# Patient Record
Sex: Male | Born: 1935 | Race: White | Hispanic: No | Marital: Married | Smoking: Never smoker
Health system: Southern US, Community
[De-identification: ages and names within clinical notes are randomized; demographics above are authoritative.]

## PROBLEM LIST (undated history)

## (undated) DIAGNOSIS — M1990 Unspecified osteoarthritis, unspecified site: Secondary | ICD-10-CM

## (undated) DIAGNOSIS — J449 Chronic obstructive pulmonary disease, unspecified: Secondary | ICD-10-CM

## (undated) DIAGNOSIS — N289 Disorder of kidney and ureter, unspecified: Secondary | ICD-10-CM

## (undated) DIAGNOSIS — N189 Chronic kidney disease, unspecified: Secondary | ICD-10-CM

## (undated) DIAGNOSIS — M109 Gout, unspecified: Secondary | ICD-10-CM

## (undated) DIAGNOSIS — I509 Heart failure, unspecified: Secondary | ICD-10-CM

## (undated) DIAGNOSIS — I10 Essential (primary) hypertension: Secondary | ICD-10-CM

## (undated) HISTORY — PX: CATARACT EXTRACTION: SUR2

## (undated) HISTORY — PX: INGUINAL HERNIA REPAIR: SUR1180

## (undated) HISTORY — PX: POLYPECTOMY: SHX149

## (undated) HISTORY — PX: LAMINECTOMY: SHX219

## (undated) HISTORY — PX: BACK SURGERY: SHX140

## (undated) HISTORY — PX: HERNIA REPAIR: SHX51

## (undated) HISTORY — PX: CHOLECYSTECTOMY: SHX55

## (undated) HISTORY — DX: Essential (primary) hypertension: I10

## (undated) HISTORY — DX: Chronic kidney disease, unspecified: N18.9

## (undated) HISTORY — DX: Unspecified osteoarthritis, unspecified site: M19.90

## (undated) SURGERY — BRONCHOSCOPY, FLEXIBLE
Anesthesia: Moderate Sedation | Laterality: Bilateral

---

## 2004-01-15 HISTORY — PX: UPPER GI ENDOSCOPY: SHX6162

## 2004-07-03 ENCOUNTER — Ambulatory Visit: Payer: Self-pay | Admitting: Internal Medicine

## 2004-07-03 LAB — HM COLONOSCOPY: HM Colonoscopy: NORMAL

## 2008-09-04 ENCOUNTER — Ambulatory Visit: Payer: Self-pay | Admitting: Ophthalmology

## 2008-09-18 ENCOUNTER — Ambulatory Visit: Payer: Self-pay | Admitting: Ophthalmology

## 2008-10-22 ENCOUNTER — Ambulatory Visit: Payer: Self-pay | Admitting: Family Medicine

## 2011-01-27 ENCOUNTER — Ambulatory Visit: Payer: Self-pay | Admitting: Family Medicine

## 2011-03-12 ENCOUNTER — Ambulatory Visit: Payer: Self-pay | Admitting: Family Medicine

## 2011-04-13 ENCOUNTER — Ambulatory Visit: Payer: Self-pay | Admitting: Internal Medicine

## 2011-05-02 ENCOUNTER — Emergency Department: Payer: Self-pay | Admitting: Emergency Medicine

## 2011-05-08 ENCOUNTER — Observation Stay: Payer: Self-pay | Admitting: Internal Medicine

## 2011-10-15 ENCOUNTER — Emergency Department: Payer: Self-pay | Admitting: Emergency Medicine

## 2011-10-15 LAB — URIC ACID: Uric Acid: 5.3 mg/dL (ref 3.5–7.2)

## 2012-11-17 ENCOUNTER — Other Ambulatory Visit: Payer: Self-pay | Admitting: Rheumatology

## 2012-11-17 LAB — SYNOVIAL FLUID, CRYSTAL

## 2013-05-08 ENCOUNTER — Ambulatory Visit: Payer: Self-pay | Admitting: Family Medicine

## 2013-05-15 ENCOUNTER — Inpatient Hospital Stay: Payer: Self-pay | Admitting: Internal Medicine

## 2013-05-15 LAB — BASIC METABOLIC PANEL
Anion Gap: 5 — ABNORMAL LOW (ref 7–16)
BUN: 22 mg/dL — ABNORMAL HIGH (ref 7–18)
Calcium, Total: 8.3 mg/dL — ABNORMAL LOW (ref 8.5–10.1)
Chloride: 108 mmol/L — ABNORMAL HIGH (ref 98–107)
Co2: 33 mmol/L — ABNORMAL HIGH (ref 21–32)
Creatinine: 1.56 mg/dL — ABNORMAL HIGH (ref 0.60–1.30)
EGFR (African American): 48 — ABNORMAL LOW
GFR CALC NON AF AMER: 41 — AB
Glucose: 138 mg/dL — ABNORMAL HIGH (ref 65–99)
OSMOLALITY: 296 (ref 275–301)
Potassium: 4.5 mmol/L (ref 3.5–5.1)
Sodium: 146 mmol/L — ABNORMAL HIGH (ref 136–145)

## 2013-05-15 LAB — CBC WITH DIFFERENTIAL/PLATELET
BASOS ABS: 0 10*3/uL (ref 0.0–0.1)
Basophil %: 0.4 %
EOS ABS: 0.1 10*3/uL (ref 0.0–0.7)
EOS PCT: 0.8 %
HCT: 49.4 % (ref 40.0–52.0)
HGB: 16 g/dL (ref 13.0–18.0)
Lymphocyte #: 1 10*3/uL (ref 1.0–3.6)
Lymphocyte %: 13.5 %
MCH: 31.1 pg (ref 26.0–34.0)
MCHC: 32.4 g/dL (ref 32.0–36.0)
MCV: 96 fL (ref 80–100)
Monocyte #: 0.9 x10 3/mm (ref 0.2–1.0)
Monocyte %: 11.2 %
Neutrophil #: 5.8 10*3/uL (ref 1.4–6.5)
Neutrophil %: 74.1 %
Platelet: 139 10*3/uL — ABNORMAL LOW (ref 150–440)
RBC: 5.15 10*6/uL (ref 4.40–5.90)
RDW: 15.3 % — AB (ref 11.5–14.5)
WBC: 7.8 10*3/uL (ref 3.8–10.6)

## 2013-05-15 LAB — TROPONIN I: Troponin-I: <0.02 ng/mL

## 2013-05-15 LAB — CK TOTAL AND CKMB (NOT AT ARMC)
CK, Total: 32 U/L — ABNORMAL LOW (ref 35–232)
CK, Total: 33 U/L — ABNORMAL LOW (ref 35–232)
CK-MB: 1.3 ng/mL (ref 0.5–3.6)
CK-MB: 1.1 ng/mL (ref 0.5–3.6)

## 2013-05-15 LAB — PRO B NATRIURETIC PEPTIDE: B-TYPE NATIURETIC PEPTID: 516 pg/mL — AB (ref 0–450)

## 2013-05-16 LAB — BASIC METABOLIC PANEL
Anion Gap: 2 — ABNORMAL LOW (ref 7–16)
BUN: 25 mg/dL — ABNORMAL HIGH (ref 7–18)
Calcium, Total: 8.6 mg/dL (ref 8.5–10.1)
Chloride: 104 mmol/L (ref 98–107)
Co2: 35 mmol/L — ABNORMAL HIGH (ref 21–32)
Creatinine: 1.61 mg/dL — ABNORMAL HIGH (ref 0.60–1.30)
EGFR (African American): 46 — ABNORMAL LOW
EGFR (Non-African Amer.): 40 — ABNORMAL LOW
Glucose: 131 mg/dL — ABNORMAL HIGH (ref 65–99)
OSMOLALITY: 287 (ref 275–301)
POTASSIUM: 4.8 mmol/L (ref 3.5–5.1)
SODIUM: 141 mmol/L (ref 136–145)

## 2013-05-16 LAB — CBC WITH DIFFERENTIAL/PLATELET
Basophil #: 0 10*3/uL (ref 0.0–0.1)
Basophil %: 0.3 %
Eosinophil #: 0 10*3/uL (ref 0.0–0.7)
Eosinophil %: 0.2 %
HCT: 47.5 % (ref 40.0–52.0)
HGB: 15.7 g/dL (ref 13.0–18.0)
Lymphocyte #: 0.7 10*3/uL — ABNORMAL LOW (ref 1.0–3.6)
Lymphocyte %: 8.5 %
MCH: 31.3 pg (ref 26.0–34.0)
MCHC: 33.1 g/dL (ref 32.0–36.0)
MCV: 95 fL (ref 80–100)
MONO ABS: 0.3 x10 3/mm (ref 0.2–1.0)
MONOS PCT: 3.2 %
Neutrophil #: 7 10*3/uL — ABNORMAL HIGH (ref 1.4–6.5)
Neutrophil %: 87.8 %
PLATELETS: 138 10*3/uL — AB (ref 150–440)
RBC: 5.02 10*6/uL (ref 4.40–5.90)
RDW: 15.3 % — ABNORMAL HIGH (ref 11.5–14.5)
WBC: 8 10*3/uL (ref 3.8–10.6)

## 2013-05-16 LAB — CK TOTAL AND CKMB (NOT AT ARMC)
CK, Total: 33 U/L — ABNORMAL LOW (ref 35–232)
CK, Total: 27 U/L — ABNORMAL LOW (ref 35–232)
CK-MB: 1.1 ng/mL (ref 0.5–3.6)
CK-MB: 1 ng/mL (ref 0.5–3.6)

## 2013-05-16 LAB — TROPONIN I: Troponin-I: <0.02 ng/mL

## 2013-05-17 LAB — BASIC METABOLIC PANEL
ANION GAP: 4 — AB (ref 7–16)
BUN: 53 mg/dL — AB (ref 7–18)
Calcium, Total: 8.5 mg/dL (ref 8.5–10.1)
Chloride: 102 mmol/L (ref 98–107)
Co2: 34 mmol/L — ABNORMAL HIGH (ref 21–32)
Creatinine: 1.99 mg/dL — ABNORMAL HIGH (ref 0.60–1.30)
GFR CALC AF AMER: 36 — AB
GFR CALC NON AF AMER: 31 — AB
Glucose: 136 mg/dL — ABNORMAL HIGH (ref 65–99)
Osmolality: 296 (ref 275–301)
Potassium: 4.7 mmol/L (ref 3.5–5.1)
SODIUM: 140 mmol/L (ref 136–145)

## 2013-05-18 LAB — CREATININE, SERUM
CREATININE: 2.56 mg/dL — AB (ref 0.60–1.30)
EGFR (African American): 26 — ABNORMAL LOW
EGFR (Non-African Amer.): 23 — ABNORMAL LOW

## 2013-09-18 LAB — BASIC METABOLIC PANEL
BUN: 25 mg/dL — AB (ref 4–21)
Creatinine: 1.6 mg/dL — AB (ref 0.6–1.3)
Glucose: 83 mg/dL
Potassium: 5.5 mmol/L — AB (ref 3.4–5.3)
Sodium: 148 mmol/L — AB (ref 137–147)

## 2013-09-18 LAB — TSH: TSH: 3.54 u[IU]/mL (ref 0.41–5.90)

## 2013-09-18 LAB — LIPID PANEL
Cholesterol: 144 mg/dL (ref 0–200)
HDL: 50 mg/dL (ref 35–70)
LDL Cholesterol: 81 mg/dL
LDl/HDL Ratio: 1.6
TRIGLYCERIDES: 64 mg/dL (ref 40–160)

## 2013-09-18 LAB — HEPATIC FUNCTION PANEL
ALK PHOS: 101 U/L (ref 25–125)
ALT: 30 U/L (ref 10–40)
AST: 29 U/L (ref 14–40)

## 2013-09-18 LAB — PSA: PSA: 1.6

## 2014-07-02 ENCOUNTER — Ambulatory Visit: Payer: Self-pay | Admitting: Family Medicine

## 2014-07-02 LAB — CBC AND DIFFERENTIAL
HCT: 46 % (ref 41–53)
Hemoglobin: 15.8 g/dL (ref 13.5–17.5)
Platelets: 134 10*3/uL — AB (ref 150–399)

## 2014-09-01 NOTE — Discharge Summary (Signed)
PATIENT NAME:  Jack Jenkins, Numa A MR#:  [id number] DATE OF BIRTH:  November 09, 1930  DATE OF ADMISSION:  05/15/2013 DATE OF DISCHARGE:  05/18/2013  ADMITTING PHYSICIAN:   Enid Baasadhika Ledger Heindl, MD DISCHARGING PHYSICIAN:  Enid Baasadhika Shanette Tamargo, MD  PRIMARY CARE PHYSICIAN: Dr. Sullivan LoneGilbert  CONSULTATIONS IN THE HOSPITAL:   1.  Cardiology consultation by Dr. Juliann Paresallwood.  2.  Pulmonary consultation by Dr. Freda MunroSaadat Khan.   DISCHARGE DIAGNOSES: 1.  Acute respiratory failure, requiring home oxygen.  2.  Acute on chronic bronchitis.  3.  Left diaphragmatic paralysis.  4.  Hypertension.  5.  Chronic obstructive pulmonary disease exacerbation.  6.  Gout.  7.  Acute renal failure.  8.  Chronic kidney disease III with baseline GFR in range of 40.   DISCHARGE HOME MEDICATIONS:  1.  Colcrys 1 tablet as needed for pain.  2.  Allopurinol 150 mg p.o. daily.  3.  Robitussin AC 5 mL q. 6 hours p.r.n. for cough.  4.  Levaquin 250 mg p.o. daily for four more days.  5.  Guaifenesin 600 mg p.o. b.i.d.  6.  Prednisone taper over six days.  7.  Advair 100/50 one  p.o. b.i.d.  8.  Spiriva inhalation capsule daily.  9.  Combivent Respimat 1 puff 4 times a day.   DISCHARGE HOME OXYGEN: 2 liters.   DISCHARGE DIET: Low-sodium diet.   DISCHARGE ACTIVITY: As tolerated.    FOLLOWUP INSTRUCTIONS: 1.  BUN and creatinine checked at Dr. Garnett FarmLateef's office in three days which is 05/22/2013.  2.  Follow up with Dr. Freda MunroSaadat Khan in one week.  3.  PCP follow-up in 2 to 3 weeks.   LABORATORY, DIAGNOSTIC AND RADIOLOGIC DATA:   Sodium 140, potassium 4.7, chloride 102, bicarbonate 34, BUN increased up to 53 and creatinine 1.9 and this 05/17/2013.  Creatinine on 05/18/2013 is 3.5.    Chest x-ray revealing elevated hemidiaphragm, no more parenchymal opacity, effusion or pneumothorax. No acute cardiopulmonary disease noted.   Echocardiogram showing left ventricular ejection fraction of 50% to 55%, low-normal global left ventricular systolic  function, mild increased left ventricular posterior wall thickness. Troponins are negative.   WBC 8.0, hemoglobin 15.7, hematocrit 47.2, platelet count 138.   CT of the chest without contrast showing no evidence of acute intrathoracic disease, stable appearance of 'tree-in'bud' nodularity which is stable since 2012 likely sequela of bronchiolitis, elevated hemidiaphragm,  previous pulmonary granulomatous infection.   BRIEF HOSPITAL COURSE: Jack Jenkins is an 81 year old Caucasian gentleman with past medical history significant for gout, hypertension, CKD stage III, and irregular heartbeat, who was sent in from Dr. Elisabeth CaraGilbert's office for unresolving cough and respiratory illness just for almost two months and also was hypoxic on presentation.  1.  Acute hypoxic respiratory failure, likely underlying chronic obstructive pulmonary disease with chronic bronchitis exacerbation with acute bronchitis. The patient had been on two rounds of prednisone and two rounds of antibiotics in the last two months without much improvement. He had unresolving cough when he initially presented. The patient required 2 liters of oxygen while in the hospital. His chest x-ray and chest CT did not show any acute changes. He was treated with steroids and Rocephin and azithromycin antibiotic, seen by pulmonology Dr. Freda MunroSaadat Khan.  The patient will need outpatient pulmonary function tests done.  At the time of discharge, the patient's breathing has improved. His mobility has improved, he has been ambulating without any respiratory distress, although he is requiring 2 liters oxygen at this time. His antibiotics are being changed  over to Levaquin and his Solu-Medrol is being changed over to prednisone taper at the time of discharge. He will follow-up with Dr. Welton Flakes as an outpatient. Initially the patient was given Lasix for his underlying congestive heart failure; however, echocardiogram ruled out any underlying congestive heart failure, and with  worsening renal failure, his Lasix was stopped immediately and he was hydrated.  2.  Acute renal failure on chronic kidney disease stage III. His GFR has been fluctuating between 30 to 40 as an outpatient and the last one was in the range of 40, according to the wife, about three weeks ago and the patient got admitted. His creatinine was 1.5 and his GFR was in the range of 41.  However, it got worse to 2.5 prior to discharge with elevated BUN and also GFR falling to 23.  His urine output has remained stable. He has been getting high-dose steroids which could cause increased muscle breakdown, elevated BUN and creatinine if he is not hydrated enough. The patient did not get any contrast during this hospitalization. Spoke with the patient's nephrologist,  Dr. Mady Haagensen,  who went over the numbers, saw the patient and said this can be followed as an outpatient. The patient will have a blood work done for his BUN and creatinine in three days from now and will follow up with Dr. Cherylann Ratel. Meanwhile, he was advised to drink lots of fluids at home. His nephrotoxic medications are held. He was only on enalapril low-dose which is being stopped at this time.  3.  Gout, stable.  He is on allopurinol and Colcrys as needed.  4.  His course has been otherwise uneventful in the hospital.   DISCHARGE CONDITION: Stable.   DISCHARGE DISPOSITION: Home with 2 liters home oxygen.   TIME SPENT ON DISCHARGE: 45 minutes.    ____________________________ Enid Baas, MD rk:cc D: 05/18/2013 15:42:34 ET T: 05/18/2013 22:36:03 ET JOB#: [id number]  cc: Enid Baas, MD, <Dictator> Yevonne Pax, MD Richard L. Sullivan Lone, MD Lennox Pippins, MD Enid Baas MD ELECTRONICALLY SIGNED 05/23/2013 18:20

## 2014-09-01 NOTE — Consult Note (Signed)
Brief Consult Note: Diagnosis: SOB AFIB HTN.   Patient was seen by consultant.   Consult note dictated.   Recommend further assessment or treatment.   Orders entered.   Discussed with Attending MD.   Comments: IMP AFIB SOB COPD Diaphram dysfunction Bronchitis HTN Cough . PLAN Tele Pul Input Consider CT chest Agree with ECHO Agree with Antibx Inhalers I do not rec cath Rate control for AFIB Consider long term anticoug Bp control Conservative cardiac input until pul w/u complete.  Electronic Signatures: Dorothyann Pengallwood, Marycarmen Hagey D (MD)  (Signed 06-Jan-15 14:15)  Authored: Brief Consult Note   Last Updated: 06-Jan-15 14:15 by Dorothyann Pengallwood, Lamonte Hartt D (MD)

## 2014-09-01 NOTE — H&P (Signed)
PATIENT NAME:  Jack Jenkins, Jack Jenkins MR#:  [id number] DATE OF BIRTH:  [birth year]  DATE OF ADMISSION:  05/15/2013  ADMITTING PHYSICIAN: Jack Baas, MD  PRIMARY CARE PHYSICIAN: Jack L. Jack Lone, MD  CHIEF COMPLAINT: Difficulty breathing and worsening cough.   HISTORY OF PRESENT ILLNESS: Jack Jenkins is an 81 year old Caucasian gentleman with past medical history significant for gouty arthritis, history of irregular heartbeat, and also left diaphragmatic dysfunction and CKD. Presents to the hospital as he was sent in from his PCP's office with unresolving cough for almost a month and also worsening dyspnea with low oxygen sats at the office today. The patient said his symptoms started about 6 to 8 weeks ago with worsening dyspnea on exertion. He was seen by Dr. Sullivan Jenkins in the office, started on a round of antibiotic, did not improve, and symptoms started getting worse and again last week he was treated with doxycycline and also prednisone. His cough got worse. He denied any fevers or chills lately but has more congested cough and due to his left diaphragmatic dysfunction, he is not even able to spit it out. He feels like his breathing has become more shallow, and his sats were in the upper 80s in the office today and he was sent over to the hospital. Denies any history of smoking, underlying COPD, or being on home oxygen in the past. No fevers, chills, or exposure to sick contacts at this time. He has not noticed any swelling of his legs, or has no prior history of congestive heart failure or coronary artery disease.   PAST MEDICAL HISTORY: 1.  Gouty arthritis.  2.  Gastroesophageal reflux disease.  3.  History of irregular heartbeat.  4.  CKD. 5.  Left diaphragmatic dysfunction.   PAST SURGICAL HISTORY: 1.  Bilateral cataract surgery.  2.  Cholecystectomy.  3.  Ventral hernia repair.  4.  Lumbar laminectomy.   ALLERGIES TO MEDICATIONS: No known drug allergies.   CURRENT HOME MEDICATIONS:  1.   Enalapril daily, unknown dose.  2.  Colcrys 0.6 mg as needed for gas.  3.  Allopurinol 150 mg p.o. daily.   SOCIAL HISTORY: Lives at home with his wife. No history of any smoking, alcohol or drug use.   FAMILY HISTORY: Significant history of coronary artery disease in the family. Father with CAD, both sisters with heart disease, and mom died from congestive heart failure.   REVIEW OF SYSTEMS: CONSTITUTIONAL: No fever, fatigue or weakness.  EYES: Uses reading glasses. No blurred vision, double vision, or inflammation or glaucoma. Had cataracts taken out.  EARS, NOSE, THROAT: No tinnitus, ear pain, hearing loss, epistaxis or discharge. No dysphagia.  RESPIRATORY: Positive for cough, wheeze, and also dyspnea. No hemoptysis or COPD.  CARDIOVASCULAR: Denies any chest pain. Positive for orthopnea. No edema. History of arrhythmia present. No palpitations or syncope.  GASTROINTESTINAL: No nausea, vomiting, diarrhea, abdominal pain, hematemesis or melena.  GENITOURINARY: No dysuria, hematuria, renal calculus, frequency or incontinence.  ENDOCRINE: No polyuria, nocturia, thyroid problems, heat or cold intolerance.  HEMATOLOGIC: No anemia, easy bruising or bleeding.  SKIN: No acne, rash or lesions.  MUSCULOSKELETAL: Possible for gouty arthritis in multiple lower extremity joints, currently none.  NEUROLOGIC: No numbness, weakness, CVA, TIA or seizures.  PSYCHOLOGICAL: No anxiety, insomnia, depression.   PHYSICAL EXAMINATION: VITAL SIGNS: Temperature 97.6 degrees Fahrenheit, pulse 98, respirations 20, blood pressure 134/70, pulse oximetry 91% at rest.  GENERAL: Well-built, well-nourished male sitting in bed, not in any acute distress but as soon as  he starts talking, he has significant congested cough and unable to catch his breath.  HEENT: Normocephalic, atraumatic. Pupils equal, round, reacting to light. Anicteric sclerae. Extraocular movements intact. Oropharynx is clear without erythema, mass or  exudates.  NECK: Supple. No thyromegaly, JVD or carotid bruits. No lymphadenopathy.  LUNGS: He has scattered expiratory wheeze bilaterally and crackles posteriorly and use of accessory muscles with minimal exertion present.   CARDIOVASCULAR: S1, S2, regular rate and rhythm, II/VI systolic murmur heard. No rubs or gallops.  ABDOMEN: Soft, nontender, nondistended. No hepatosplenomegaly. Normal bowel sounds.  EXTREMITIES: No pedal edema, no clubbing or cyanosis, 1+ dorsalis pedis pulses palpable bilaterally.  SKIN: No acne, rash or lesions.  MUSCULOSKELETAL: No joint tenderness or swelling or erythema noted.  LYMPHATIC: No cervical lymphadenopathy.  NEUROLOGIC: Cranial nerves II through XII remain intact. No focal motor or sensory deficits. PSYCHOLOGICAL: The patient is awake, alert, oriented x 3.   LABORATORY, DIAGNOSTIC AND RADIOLOGICAL DATA: Lab data is pending at this time. Chest x-ray has been done and it shows chronic elevation of left hemidiaphragm and chronic sclerosis involving left 4th rib and chronic lung markings noted. No evidence of edema or airspace disease noted. EKG is pending at this time.   ASSESSMENT AND PLAN: This is an 81 year old male with a history of gout and irregular heartbeat and left diaphragmatic dysfunction, presents to the hospital secondary to worsening dyspnea, likely secondary to acute bronchitis with underlying reactive airway disease and congestive heart failure. 1.  Worsening dyspnea: Failing outpatient treatment for bronchitis, even now has productive cough. Will send for sputum cultures. The patient has left diaphragmatic paralysis, so will add Mucomyst and also Mucinex to clean out the mucus and chest percussion to help him cough it up. DuoNebs have been ordered. Solu-Medrol for underlying reactive airway disease and Rocephin and azithromycin for possible bronchitis. He probably could also have underlying congestive heart failure, maybe with right heart  dysfunction, so will get cardiology consult. Start on gentle dose of Lasix. BNP has been ordered and is pending at this time and also echocardiogram. 2.  Gouty arthritis: Seems to be stable at this time, so continue allopurinol.  3.  Chronic kidney disease: Labs are pending, so will have to monitor that, especially while on Lasix.  4.  Irregular heartbeat: Currently in sinus rhythm. He was seen by Dr. Gwen PoundsKowalski for the same in the past. Currently stable. Monitor on telemetry.  5.  Hypertension: Continue enalapril, unknown dose. Will start at lower dose.  6.  Code status: Full code.  7.  Deep vein thrombosis prophylaxis with subcutaneous heparin.   TIME SPENT ON ADMISSION: 50 minutes.   ____________________________ Jack Baasadhika Ardyth Kelso, MD rk:jcm D: 05/15/2013 19:22:47 ET T: 05/15/2013 20:09:15 ET JOB#: [id number]  cc: Jack Baasadhika Giovanny Dugal, MD, <Dictator> Jack L. Jack LoneGilbert, MD Jack BaasADHIKA Sloan Galentine MD ELECTRONICALLY SIGNED 05/23/2013 14:57

## 2014-09-01 NOTE — Consult Note (Signed)
PATIENT NAME:  Jack Jenkins, Mosi A MR#:  [id number] DATE OF BIRTH:  08/29/1930  DATE OF CONSULTATION:  05/16/2013  CARDIOLOGY CONSULTATION  REFERRING PHYSICIAN:  Dr. Sullivan LoneGilbert. CONSULTING PHYSICIAN:  Chaise Passarella D. Juliann Paresallwood, MD CARDIOLOGIST: Dr. Darrold JunkerParaschos.  INDICATION: Shortness of breath, cough, possible heart failure.  HISTORY OF PRESENT ILLNESS: [name] is an 81 year old white male with past history of gouty arthritis, irregular heartbeat, diaphragmatic dysfunction, and chronic renal insufficiency with reflux.  He presented to the hospital after his PCP had treated him for outpatient bronchitis, shortness of breath, and COPD-type symptoms.  He got antibiotics, he got steroids with only the current symptoms of cough and sputum production. No significant fevers.  He has had dyspnea on exertion,  some weakness, fatigue, and COPD exacerbation. He is known to have left-sided diaphragmatic paralysis, but  has done reasonably well up until now. He has been battling with this cough, dyspnea on exertion, shortness of breath for at least 8 weeks.  Wanted to return to the office with recurrent symptoms, his primary doctor recommended hospitalization for further evaluation and management.   PAST MEDICAL HISTORY: Gouty arthritis,   , irregular heartbeat, renal insufficiency, diaphragmatic dysfunction  .    PAST SURGICAL HISTORY: Bilateral cataract, cholecystectomy, ventral hernia, lumbar laminectomy.   ALLERGIES: None.   MEDICATIONS: Enalapril. He is on Colcrys. He  is on allopurinol. Colcrys is 0.6  mg daily, , allopurinol 150 mg daily.  SOCIAL HISTORY: He lives with his wife. Denies smoking or alcohol consumption.   FAMILY HISTORY: Coronary artery failure, heart disease, congestive heart failure.  REVIEW OF SYSTEMS: Denies blackout spells, syncope. No nausea or vomiting. No fever, no chills, no sweats. No weight loss, no weight gain. No hemoptysis, hematemesis. Denies  change or hearing change. Denies  . He has  had cough, he has had congestion. He has had dyspnea, shortness of breath, PND, orthopnea, leg swelling. Denies any chest pain.   PHYSICAL EXAMINATION:  VITAL SIGNS:  Blood pressure was about 130/70, pulse of 100, respiratory rate of 16, afebrile.  HEENT: Normocephalic, atraumatic. Pupils equal and reactive to  light.  NECK: Supple, no significant JVD or lymphadenopathy. LUNGS: Bilateral rhonchi. Decreased breath sounds on the left, occasional coarse crackles, mild  dullness on the left.  HEART: Distant, but regular. Systolic ejection murmur at the apex,   ABDOMEN: Benign. EXTREMITIES: Within normal limits with 2+ edema. NEUROLOGIC: Intact.  SKIN: Normal.   CHEST X-RAY:  Chronic elevation of left  chronic   of the left rib area.   LABORATORIES:  Others are pending including    and CBC.  Creatinine 1.61, BUN 25, , sodium, K+ 4.3, chloride of 104. Troponin was negative.  CPK was negative.  ASSESSMENT: Shortness of breath, dyspnea, cough, diaphragmatic paralysis,  history of gouty arthritis, chronic renal insufficiency, obesity, hypertension,  irregular heartbeat.   PLAN: Continue current medications. Recommend telemetry   irregular heartbeat .  Supplemental oxygen as necessary. Recommend pulmonary input, would strongly consider pulmonary consult. Would also consider a CT of the chest. Agree with antibiotics. Agree with steroid therapy. Agree with inhalers. Echocardiogram will be helpful for assessment of LV function. Continue .  Continue possible heart failure treatment and management. Consider switching from enalapril, which may contribute to cough, but his cough does not sound like a typical   cough.  Recommend deep venous thrombosis prophylaxis.  Continue medications for gout. Recommend chronic aspirin therapy as well. Continue  therapy. May need evaluation for obstructive sleep apnea at some  point.  We will maintain a relatively conservative cardiology input until his pulmonary workup is  completed. Do not recommend cardiac catheterization at this point.   ____________________________ Bobbie Stack. Juliann Pares, MD ddc:sg D: 05/17/2013 08:27:00 ET T: 05/17/2013 09:06:25 ET JOB#: [id number]  cc: Kellis Mcadam D. Juliann Pares, MD, <Dictator>  Alwyn Pea MD ELECTRONICALLY SIGNED 06/20/2013 10:05

## 2014-09-02 NOTE — H&P (Signed)
PATIENT NAME:  Jack Jenkins, Jack Jenkins MR#:  [id number] DATE OF BIRTH:  March 14, 1931  DATE OF ADMISSION:  05/08/2011  REFERRING PHYSICIAN: Dr. Daryel November  PRIMARY CARE PHYSICIAN: Dr. Julieanne Manson   REASON FOR ADMISSION: Difficulty with walking, severe left foot pain.   HISTORY OF PRESENT ILLNESS: Patient is a 81 year old white male with previous history of having gouty arthritis. Last time he had an attack was around March. At that time was treated with steroids, apparently colchicine, and he reports that he had some sort of injection in his foot and his symptoms improved and he did not have a problem until last week when he again had a flare where he started having pain in his left foot and swelling. He was seen in the ED, was discharged on prednisone 50 mg for a few days. He was also given pain medications. His symptoms continued to get worse. He could not ambulate and now comes to the ED with worsening pain, unable to walk, swelling in the left ankle area with redness. He otherwise denies any fevers, chills. No trauma to the leg. He does report chronic mild shortness of breath due to diaphragmatic dysfunction. He has been seen by pulmonary for this.    PAST MEDICAL HISTORY:  1. Gastroesophageal reflux disease.  2. Gout.  3. Difficulty with hearing.  4. History of irregular heart rhythm in the past. 5. Chronic renal failure, has been followed by nephrology. 6. Left diaphragmatic dysfunction.   PAST SURGICAL HISTORY:  1. Status post cholecystectomy.  2. Hernia repair. 3. Status post lumbar laminectomy.   ALLERGIES: None.     CURRENT MEDICATIONS:  1. Vitamin D 50,000 international units q. weekly. He has been scheduled to get this for six weeks and then q. monthly.  2. Tylenol 650 q.6 p.r.n.   SOCIAL HISTORY: No smoking. No alcohol or drugs.   FAMILY HISTORY: Father with coronary artery disease. Mother died of congestive heart failure.   REVIEW OF SYSTEMS: CONSTITUTIONAL: Denies any  fevers, fatigue. Complains of weakness, pain in the left foot. Also has some pain on the right foot. No weight loss. No weight gain. EYES: No blurred or double vision. No eye pain. No redness. No inflammation. No glaucoma. No cataracts. ENT: No tinnitus. No ear pain. No hearing loss. No seasonal or year-round allergies. No epistaxis. No nasal discharge. No postnasal drip. No sinus pain. No difficulty with swallowing. RESPIRATORY: No cough. No wheezing. No hemoptysis. No dyspnea. No asthma. No painful respirations. No chronic obstructive pulmonary disease. No tuberculosis. No pneumonia. CARDIOVASCULAR: No chest pain. No orthopnea. No edema. No arrhythmia. GASTROINTESTINAL: No nausea, vomiting, diarrhea. No abdominal pain. No hematemesis. No melena. No ulcer. No gastroesophageal reflux disease. No irritable bowel syndrome. No jaundice. No rectal bleeding. GENITOURINARY: Denies any dysuria, hematuria, renal calculus, frequency or incontinence. ENDO: Denies any polyuria, nocturia, thyroid problems. HEME/LYMPH: Denies any anemia, easy bruisability, bleeding, or swollen glands. SKIN: Erythema involving the left ankle area. No other rashes. MUSCULOSKELETAL: Has pain in the ankle. Also has some pain in his right foot. Has a history of gout. There is swelling. There is redness. Limited movement of that left foot. NEUROLOGICAL: No numbness. No weakness. No cerebrovascular accident. No transient ischemic attack. PSYCHIATRIC: No anxiety. No insomnia. No ADD.   PHYSICAL EXAMINATION:  VITAL SIGNS: Temperature 98.4, pulse 112, respirations 20, blood pressure 156/70, O2 91% on room air.   GENERAL: Patient is an obese white male currently not in any acute distress.   HEENT: Head  atraumatic, normocephalic. Pupils equal, round, reactive to light and accommodation. Extraocular movements intact. Oropharynx is clear without any exudates. Nasal exam shows no drainage or ulceration. Ear exam shows no drainage or ulceration.    NECK: There is no thyromegaly. No carotid bruits.   CARDIOVASCULAR: Regular rate and rhythm. No murmurs, rubs, clicks, or gallops. PMI is not displaced.   LUNGS: Clear to auscultation bilaterally without any rales, rhonchi, wheezing.   ABDOMEN: Soft, nontender, nondistended. Positive bowel sounds x4.   EXTREMITIES: Left foot near the ankle he has erythema, warmth. There is tenderness to touch, limited range of motion in that area. No other joint inflammation.   VASCULAR: Good DP, PT pulses.   NEUROLOGICAL: Awake, alert, oriented x3. No focal deficits.   LYMPHATICS: No lymph nodes palpable.   SKIN: He has a dry scaly rash which exactly doesn't  look like psoriasis but could be atypical form of psoriasis.   PSYCHIATRIC: Not anxious, depressed.   LABORATORY, DIAGNOSTIC, AND RADIOLOGICAL DATA: Glucose 138, BUN 23, creatinine 1.59, sodium 145, potassium 4.9, CO2 35, GFR 55. Uric acid is elevated at 7.5. LFTs showed albumin 3.0. WBC 9.7. Sedimentation rate 4, hemoglobin 15.9.   ASSESSMENT AND PLAN: Patient is a 81 year old white male with history of gout comes in the ED with left ankle pain, difficulty with walking, erythema with swelling.  1. Difficulty with walking, severe gout attack, also could be pseudogout. At this time will place him on observation due to difficulty with walking and failure of p.o. prednisone therapy. Will treat him with short course of IV steroids. His uric acid is elevated at 7.5. Once this gouty attack is finished he should be treated with allopurinol. Will place him on colchicine b.i.d. Due to his chronic renal failure I will not use it every 6 or 8 hours. Will avoid NSAIDs due to his chronic renal failure with decreased GFR.  2. Gastroesophageal reflux disease. Not on any treatment. Monitor for symptoms.  3. Chronic renal failure. Follow BMP.  4. CODE STATUS: FULL CODE.    TIME SPENT: 35 minutes spent.  ____________________________ Lacie ScottsShreyang H. Allena KatzPatel,  MD shp:cms D: 05/08/2011 13:46:30 ET T: 05/08/2011 13:57:42 ET JOB#: [id number]  cc: Dreyson Mishkin H. Allena KatzPatel, MD, <Dictator> Richard L. Sullivan LoneGilbert, MD Charise CarwinSHREYANG H Cowen Pesqueira MD ELECTRONICALLY SIGNED 05/16/2011 15:19

## 2014-09-02 NOTE — Discharge Summary (Signed)
PATIENT NAME:  Jack Jenkins, Jack Jenkins MR#:  [id number] DATE OF BIRTH:  17-May-1930  DATE OF ADMISSION:  05/08/2011 DATE OF DISCHARGE:  05/09/2011  ADMITTING DIAGNOSES:  1. Difficulty walking. 2. Gout exacerbation.  DISCHARGE DIAGNOSES: 1. Left foot gout exacerbation. 2. Difficulty walking due to pain, resolving.  3. Chronic renal failure. 4. Gastroesophageal reflux disease. 5. Vitamin D deficiency.   DISCHARGE CONDITION: Stable.   DISCHARGE MEDICATIONS: The patient is to resume his outpatient medications which are:  1. Vitamin D 50,000 units once a week for four weeks and then 1 capsule a month for six months.  2. Tylox 5/325 mg 1 tablet every four hours as needed.  3. Prednisone 50 mg p.o. once today on 05/09/2011, then taper by 10 mg daily until stopped.  4. Colchicine 0.6 mg twice daily.  5. Tylenol 325 mg p.o. every four hours.   DO NOT TAKE: The patient was advised not to take nonsteroidal inflammatory medications such as Advil or ibuprofen.   HOME OXYGEN: None.   DIET: Regular.   PHYSICAL ACTIVITY LIMITATIONS: As tolerated.   FOLLOW-UP: Follow-up appointment with Dr. Sullivan Lone in two days after discharge.  CONSULTANTS: None.   RADIOLOGIC STUDIES: Left ankle complete x-ray 05/08/2011 mild degenerative changes of left ankle. No osseous changes. No evidence of acute fracture.   HISTORY OF PRESENT ILLNESS: The patient is a 81 year old Caucasian male with past medical history significant for history of gout who presented to the hospital with complaints of left foot pain on 05/08/2011. Please refer to Dr. Serita Grit Patel's admission note on 05/08/2011. Apparently the patient had history of gouty arthritis in the past. His last attack was in March 2012. At that time he was treated with steroids as well as colchicine and also had injection in his foot and his symptoms resolved. This time he came to the Emergency Room on Christmas Day and was discharged on prednisone as well as pain medications.  However, his symptoms continued to get worse. He was not able to ambulate well. He was complaining of left ankle swelling and presented to the hospital for further evaluation and was admitted.  PHYSICAL EXAMINATION: On arrival to the Emergency Room the second time, his temperature was 98.4, pulse 112, respiratory rate 20, blood pressure 156/70, saturation 91% on room air. Physical exam revealed mild erythema and warmth at the ankle, however, also tenderness to touch, limited range of motion in the left ankle. No other joint inflammation, pain, or swelling were noted.   LABORATORY, DIAGNOSTIC, AND RADIOLOGICAL DATA: BUN and creatinine elevated to 23 and 1.59 on 05/08/2011 respectively. Glucose 138, otherwise unremarkable BMP. The patient's uric acid level was also elevated at 7.5. Liver enzymes showed albumin level of 3.0, otherwise unremarkable study. CBC was within normal limits. Sedimentation rate was normal at 4. The patient's absolute neutrophil count was elevated at 7.4. Urinalysis was normal.  HOSPITAL COURSE: The patient was admitted to the hospital. He was started on steroids IV as well as colchicine and pain medications with Percocet or Tylox. His condition significantly improved. He was also evaluated by physical therapist who felt the patient is stable to be discharged home and no recommendations for home health or outpatient physical therapy were given upon discharge as the patient did quite well. The patient is to continue steroid taper as well as colchicine and pain medications as needed. He is to follow-up with his primary care physician for further recommendations. The patient was advised not to use any aspirin and patients with chronic  renal insufficiency should use colchicine, however, it is recommended to follow the patient's creatinine levels as outpatient.  On the day of discharge, 05/09/2011, the patient was on 24-hour colchicine orally. The patient's BUN and creatinine actually were  improved. They were 26 and 1.46 respectively. It is recommended, however, to check the patient's BUN and creatinine as outpatient especially now since he was on colchicine for his acute gouty arthritis exacerbation.  In regards to chronic renal failure, as mentioned above the patient's creatinine somewhat improved and it is recommended to follow the patient's creatinine levels as outpatient.  In regards to gastroesophageal reflux disease, the patient is to continue managing his gastroesophageal reflux disease with outpatient medications as needed.   For Vitamin D deficiency, the patient is to continue Vitamin D per Dr. Elisabeth CaraGilbert's recommendations.   The patient is being discharged with the above-mentioned medications and follow-up.   VITAL SIGNS ON THE DAY OF DISCHARGE: Temperature 97.4, pulse 88, respiratory rate 19, blood pressure 139/82, saturation 94% on room air at rest.   TIME SPENT: 40 minutes.   ____________________________ Katharina Caperima Afrika Brick, MD rv:drc D: 05/09/2011 19:34:20 ET T: 05/13/2011 12:13:07 ET JOB#: [id number]  cc: Katharina Caperima Kloey Cazarez, MD, <Dictator> Richard L. Sullivan LoneGilbert, MD Katharina CaperIMA Lela Murfin MD ELECTRONICALLY SIGNED 06/07/2011 9:56

## 2014-11-05 ENCOUNTER — Telehealth: Payer: Self-pay | Admitting: Family Medicine

## 2014-11-06 ENCOUNTER — Other Ambulatory Visit: Payer: Self-pay

## 2014-11-06 ENCOUNTER — Encounter: Payer: Self-pay | Admitting: Family Medicine

## 2014-11-06 ENCOUNTER — Ambulatory Visit (INDEPENDENT_AMBULATORY_CARE_PROVIDER_SITE_OTHER): Payer: Medicare Other | Admitting: Family Medicine

## 2014-11-06 VITALS — BP 124/72 | HR 88 | Temp 98.1°F | Resp 18 | Wt 214.4 lb

## 2014-11-06 DIAGNOSIS — B370 Candidal stomatitis: Secondary | ICD-10-CM | POA: Diagnosis not present

## 2014-11-06 DIAGNOSIS — M109 Gout, unspecified: Secondary | ICD-10-CM | POA: Insufficient documentation

## 2014-11-06 DIAGNOSIS — K635 Polyp of colon: Secondary | ICD-10-CM | POA: Insufficient documentation

## 2014-11-06 DIAGNOSIS — J45909 Unspecified asthma, uncomplicated: Secondary | ICD-10-CM | POA: Insufficient documentation

## 2014-11-06 DIAGNOSIS — K219 Gastro-esophageal reflux disease without esophagitis: Secondary | ICD-10-CM | POA: Insufficient documentation

## 2014-11-06 DIAGNOSIS — M1990 Unspecified osteoarthritis, unspecified site: Secondary | ICD-10-CM | POA: Insufficient documentation

## 2014-11-06 DIAGNOSIS — R000 Tachycardia, unspecified: Secondary | ICD-10-CM | POA: Insufficient documentation

## 2014-11-06 DIAGNOSIS — N529 Male erectile dysfunction, unspecified: Secondary | ICD-10-CM | POA: Insufficient documentation

## 2014-11-06 DIAGNOSIS — I509 Heart failure, unspecified: Secondary | ICD-10-CM | POA: Insufficient documentation

## 2014-11-06 DIAGNOSIS — H16139 Photokeratitis, unspecified eye: Secondary | ICD-10-CM | POA: Insufficient documentation

## 2014-11-06 DIAGNOSIS — N399 Disorder of urinary system, unspecified: Secondary | ICD-10-CM | POA: Insufficient documentation

## 2014-11-06 DIAGNOSIS — J309 Allergic rhinitis, unspecified: Secondary | ICD-10-CM | POA: Insufficient documentation

## 2014-11-06 MED ORDER — NYSTATIN 100000 UNIT/ML MT SUSP: 5.0000 mL | Freq: Four times a day (QID) | OROMUCOSAL | Status: DC

## 2014-11-06 NOTE — Progress Notes (Signed)
Subjective:     Patient ID: Jack Jenkins A Lokey, male   DOB: 26-Feb-1931, 81 y.o.   MRN: [id number]  HPI  Chief Complaint  Patient presents with  . Thrush    patient comes in office today with concerns or white coating on his tongue patient states that he has had this problem on and off for some time now but in the past week and half it has stayed persistent and he has noticed a bad taste in hiis mouth  Reports he has seen ENT in the past and has been rx'ed a one dose pill which he does not believe helped much. Not currently on Symbicort or has he had recent antibiotic treatment. No hx of diabetes. States he is able to rub off the coating with his toothbrush.   Review of Systems  Constitutional: Negative for fever and chills.       Objective:   Physical Exam  Constitutional: He appears well-developed and well-nourished. No distress.  HENT:  Tongue with a mild white coating which does not rub off today (patient reports brushing his tongue earlier today) No buccal plaques       Assessment:    1. Thrush - nystatin (MYCOSTATIN) 100000 UNIT/ML suspension; Take 5 mLs (500,000 Units total) by mouth 4 (four) times daily.  Dispense: 120 mL; Refill: 1    Plan:    Phone f/u if not improving.

## 2014-11-06 NOTE — Patient Instructions (Signed)
If not improving call for further discussion.

## 2014-11-19 ENCOUNTER — Ambulatory Visit
Admission: RE | Admit: 2014-11-19 | Discharge: 2014-11-19 | Disposition: A | Discharge status: Home / Self Care | Payer: Medicare Other | Source: Ambulatory Visit | Attending: Family Medicine | Admitting: Family Medicine

## 2014-11-19 ENCOUNTER — Telehealth: Payer: Self-pay

## 2014-11-19 ENCOUNTER — Ambulatory Visit (INDEPENDENT_AMBULATORY_CARE_PROVIDER_SITE_OTHER): Payer: Medicare Other | Admitting: Family Medicine

## 2014-11-19 ENCOUNTER — Encounter: Payer: Self-pay | Admitting: Family Medicine

## 2014-11-19 VITALS — BP 90/48 | HR 90 | Temp 97.8°F | Resp 16 | Wt 213.0 lb

## 2014-11-19 DIAGNOSIS — R918 Other nonspecific abnormal finding of lung field: Secondary | ICD-10-CM | POA: Insufficient documentation

## 2014-11-19 DIAGNOSIS — K219 Gastro-esophageal reflux disease without esophagitis: Secondary | ICD-10-CM | POA: Diagnosis not present

## 2014-11-19 DIAGNOSIS — R0602 Shortness of breath: Secondary | ICD-10-CM

## 2014-11-19 MED ORDER — SUCRALFATE 1 G PO TABS: 1.0000 g | ORAL_TABLET | Freq: Three times a day (TID) | ORAL | Status: DC

## 2014-11-19 NOTE — Telephone Encounter (Signed)
-----   Message from Anola Gurneyobert Chauvin, [place] sent at 11/19/2014  3:24 PM EDT ----- Discussed- no pneumonia, chronic changes only.

## 2014-11-19 NOTE — Patient Instructions (Signed)
Start Zantac 75 mg. Twice daily along with the prescription medication Carafate. We will call you with the result of the X-ray and labs.

## 2014-11-19 NOTE — Progress Notes (Signed)
Subjective:     Patient ID: Jack Jenkins, male   DOB: 05-30-1930, 81 y.o.   MRN: [id number]  HPI  Chief Complaint  Patient presents with  . Thrush    Patient comes in office today for follow up in regards to white coating in his mouth that has been present for months. Patient was last prescrubed nystatin to treat infection but did not clear up  . Gastrophageal Reflux    patient comes in office to address reflux that has been intermittent for the past month he states when he regurgitates its clear fluids.   . Shortness of Breath    patient reports that o2 has beeen up and down for the past 10 days. Patient records o2 stats at home and has been 82-90%   States he will take an occasional zantac for sx. Does not think Nystatin helped much with his white tongue.States he has a bad taste in his mouth. Reports he does not exercise much as he has moved to a town home and there is little maintenance to do.    Review of Systems  Respiratory: Positive for cough (occasional).        States he is no longer on oxygen or breathing medication since episode of bronchitis in the past year (Dr. Park BreedKahn).       Objective:   Physical Exam  Constitutional: He appears well-developed and well-nourished. No distress.  HENT:  Tongue with white coating/no plaque  Cardiovascular: Normal rate.   Occasional skipped beat  Pulmonary/Chest:  Left basilar crackles  Musculoskeletal: He exhibits no edema (of lower extremties).       Assessment:    1. Gastroesophageal reflux disease, esophagitis presence not specified - sucralfate (CARAFATE) 1 G tablet; Take 1 tablet (1 g total) by mouth 4 (four) times daily -  with meals and at bedtime.  Dispense: 28 tablet; Refill: 0  2. Shortness of breath- - CBC with Differential/Platelet - DG Chest 2 View; Future    Plan:   Further f/u pending evaluation of lab and x-ray results. ? Multifactorial triggers for his symptoms. Start Zantac 75 twice daily

## 2014-11-19 NOTE — Telephone Encounter (Signed)
Patient has been advised

## 2014-11-20 LAB — CBC WITH DIFFERENTIAL/PLATELET
Basophils Absolute: 0 10*3/uL (ref 0.0–0.2)
Basos: 0 %
EOS (ABSOLUTE): 0.1 10*3/uL (ref 0.0–0.4)
Eos: 3 %
Hematocrit: 46.7 % (ref 37.5–51.0)
Hemoglobin: 15.5 g/dL (ref 12.6–17.7)
IMMATURE GRANS (ABS): 0 10*3/uL (ref 0.0–0.1)
Immature Granulocytes: 0 %
LYMPHS: 22 %
Lymphocytes Absolute: 1.1 10*3/uL (ref 0.7–3.1)
MCH: 31.5 pg (ref 26.6–33.0)
MCHC: 33.2 g/dL (ref 31.5–35.7)
MCV: 95 fL (ref 79–97)
MONOCYTES: 14 %
MONOS ABS: 0.7 10*3/uL (ref 0.1–0.9)
Neutrophils Absolute: 3.2 10*3/uL (ref 1.4–7.0)
Neutrophils: 61 %
PLATELETS: 188 10*3/uL (ref 150–379)
RBC: 4.92 x10E6/uL (ref 4.14–5.80)
RDW: 14.1 % (ref 12.3–15.4)
WBC: 5.2 10*3/uL (ref 3.4–10.8)

## 2014-12-17 ENCOUNTER — Ambulatory Visit (INDEPENDENT_AMBULATORY_CARE_PROVIDER_SITE_OTHER): Payer: Medicare Other | Admitting: Family Medicine

## 2014-12-17 ENCOUNTER — Encounter: Payer: Self-pay | Admitting: Family Medicine

## 2014-12-17 VITALS — BP 104/70 | HR 80 | Temp 97.7°F | Resp 18 | Wt 214.4 lb

## 2014-12-17 DIAGNOSIS — N189 Chronic kidney disease, unspecified: Secondary | ICD-10-CM | POA: Insufficient documentation

## 2014-12-17 DIAGNOSIS — I5032 Chronic diastolic (congestive) heart failure: Secondary | ICD-10-CM | POA: Diagnosis not present

## 2014-12-17 DIAGNOSIS — K219 Gastro-esophageal reflux disease without esophagitis: Secondary | ICD-10-CM | POA: Diagnosis not present

## 2014-12-17 DIAGNOSIS — N183 Chronic kidney disease, stage 3 (moderate): Secondary | ICD-10-CM

## 2014-12-17 MED ORDER — RANITIDINE HCL 150 MG PO TABS: 150.0000 mg | ORAL_TABLET | Freq: Two times a day (BID) | ORAL | Status: DC

## 2014-12-17 MED ORDER — ENALAPRIL MALEATE 2.5 MG PO TABS: 2.5000 mg | ORAL_TABLET | Freq: Every day | ORAL | Status: DC

## 2014-12-17 NOTE — Patient Instructions (Signed)
Resume enalapril daily. Try Zantac 150 mg. Twice daily.

## 2014-12-17 NOTE — Progress Notes (Addendum)
Subjective:     Patient ID: Jack Jenkins, male   DOB: [birth year], 81 y.o.   MRN: [id number]  HPI  Chief Complaint  Patient presents with  . Follow-up    Patient is present in office today for follow up on 11/19/14. At last office visit patient was prescribed Carafate 1gm and Zantac for GERD, patient reports that symptoms of reflux have not improved. He continues to take Zantac but states that he has burning in his lower abdomen radiating up to his chest, causing nausea.   . Shortness of Breath    Patient is present for follow up on 07/11 for shortness of breath, chest x-ray at visit showed no signs of pneumonia jiust chronic changes. Patient reports still having shortness of breath he took o2 stat this morning and read 89%.   States he did not see much improvement withZantac and Carafate. States his head of bed is up on bricks. Continues to have throat discomfort and intermittent white coating to his tongue. Does not take enalapril ("I think I have it at home.") as recommended by renal and cardiology. He has both a blood pressure cuff and oximeter at home ("on the table") and periodically likes to check his numbers.   Review of Systems  States he last saw his renal doctor 2 weeks ago. He has been told to avoid PPI's.     Objective:   Physical Exam  Constitutional: He appears well-developed and well-nourished. No distress.  Cardiovascular: Normal rate and regular rhythm.   Pulmonary/Chest: He has wheezes (brief basilar expiratory wheezes).  Abdominal: Soft. Bowel sounds are normal. There is tenderness (mild in epigastric area (patient reports hx of ventral hernia)). There is no guarding.  Musculoskeletal: He exhibits no edema (of lower extremities).       Assessment:    1. Gastroesophageal reflux disease, esophagitis presence not specified - ranitidine (ZANTAC) 150 MG tablet; Take 1 tablet (150 mg total) by mouth 2 (two) times daily.  Dispense: 60 tablet; Refill: 5  2. Chronic diastolic  congestive heart failure - enalapril (VASOTEC) 2.5 MG tablet; Take 1 tablet (2.5 mg total) by mouth daily.  Dispense: 90 tablet; Refill: 1  3. CKD (chronic kidney disease), stage 3 (moderate)    Plan:    Resume enalapril. F/u with primary M.D. In two weeks.

## 2014-12-31 ENCOUNTER — Ambulatory Visit (INDEPENDENT_AMBULATORY_CARE_PROVIDER_SITE_OTHER): Payer: Medicare Other | Admitting: Family Medicine

## 2014-12-31 ENCOUNTER — Encounter: Payer: Self-pay | Admitting: Family Medicine

## 2014-12-31 VITALS — BP 132/60 | HR 80 | Temp 98.2°F | Resp 14 | Wt 211.0 lb

## 2014-12-31 DIAGNOSIS — I509 Heart failure, unspecified: Secondary | ICD-10-CM | POA: Diagnosis not present

## 2014-12-31 DIAGNOSIS — R0602 Shortness of breath: Secondary | ICD-10-CM

## 2014-12-31 DIAGNOSIS — K219 Gastro-esophageal reflux disease without esophagitis: Secondary | ICD-10-CM

## 2014-12-31 DIAGNOSIS — R1033 Periumbilical pain: Secondary | ICD-10-CM | POA: Diagnosis not present

## 2014-12-31 NOTE — Progress Notes (Addendum)
Patient ID: Jack Jenkins, male   DOB: April 06, 1931, 81 y.o.   MRN: [id number]    Subjective:  HPI  GERD follow up: Patient has been treated for acid reflux with Zantac. He saw Nadine Counts on August 8th due to his tongue having white thrush on but was not advised what to do but to continue Zantac. Patient states that every few weeks or every month or so he gets a stabbing pain that goes across his stomach and he vomits shortly after but with clear liquid at that time, after resting for a few minutes he feels fine but never throws up food. He states that before the pain and vomiting starts for a few days prior he notices his tongue gets a white color to it that gets whiter and whiter and then the episode with pain begins and vomiting.  CHF follow up: Patient has noticed his oxygen level has been lower-he checks it at home and it has been running between 87 to 91%. He gets SOB mainly notices at night time when lays down. No chest pain and had 1 episode of dizziness last week but not since then. No leg/ankle swelling.  Prior to Admission medications   Medication Sig Start Date End Date Taking? Authorizing Provider  allopurinol (ZYLOPRIM) 100 MG tablet  09/03/14   Historical Provider, MD  enalapril (VASOTEC) 2.5 MG tablet Take 1 tablet (2.5 mg total) by mouth daily. 12/17/14   Anola Gurney, PA  ranitidine (ZANTAC) 150 MG tablet Take 1 tablet (150 mg total) by mouth 2 (two) times daily. 12/17/14   Anola Gurney, PA    Patient Active Problem List   Diagnosis Date Noted  . CKD (chronic kidney disease) 12/17/2014  . Photokeratitis 11/06/2014  . Allergic rhinitis 11/06/2014  . AB (asthmatic bronchitis) 11/06/2014  . CCF (congestive cardiac failure) 11/06/2014  . Colon polyp 11/06/2014  . Urinary system disease 11/06/2014  . ED (erectile dysfunction) of organic origin 11/06/2014  . Acid reflux 11/06/2014  . Acute gouty arthropathy 11/06/2014  . Gout 11/06/2014  . Arthritis, degenerative 11/06/2014  .  Fast heart beat 11/06/2014    History reviewed. No pertinent past medical history.  Social History   Social History  . Marital Status: Married    Spouse Name: N/A  . Number of Children: N/A  . Years of Education: N/A   Occupational History  . Not on file.   Social History Main Topics  . Smoking status: Never Smoker   . Smokeless tobacco: Never Used  . Alcohol Use: No  . Drug Use: No  . Sexual Activity: No   Other Topics Concern  . Not on file   Social History Narrative    No Known Allergies  Review of Systems  Constitutional: Positive for malaise/fatigue. Negative for fever and chills.  HENT:       Hoarseness present  Eyes: Negative.   Respiratory: Positive for sputum production and shortness of breath. Negative for cough and wheezing.   Cardiovascular: Negative for chest pain, palpitations and leg swelling.  Gastrointestinal: Positive for vomiting. Negative for heartburn, nausea, diarrhea and constipation.  Musculoskeletal: Negative for myalgias, back pain, joint pain, falls and neck pain.  Neurological: Positive for dizziness. Negative for tingling, tremors, weakness and headaches.  Psychiatric/Behavioral: Negative.     Immunization History  Administered Date(s) Administered  . Pneumococcal Conjugate-13 09/15/2013  . Td 06/04/2004   Objective:  BP 132/60 mmHg  Pulse 80  Temp(Src) 98.2 F (36.8 C)  Resp 14  Wt  211 lb (95.709 kg)  SpO2 90%  Physical Exam  Constitutional: He is oriented to person, place, and time and well-developed, well-nourished, and in no distress.  HENT:  Head: Normocephalic and atraumatic.  Right Ear: External ear normal.  Left Ear: External ear normal.  Nose: Nose normal.  Eyes: Conjunctivae are normal.  Neck: Neck supple.  Cardiovascular: Normal rate, normal heart sounds and normal pulses.  An irregular rhythm present.  No murmur heard. Pulmonary/Chest: Effort normal and breath sounds normal. No respiratory distress. He has  no wheezes. He has no rales.  Abdominal: Soft. Bowel sounds are normal. He exhibits no distension and no mass. There is no tenderness. There is no guarding.  Musculoskeletal: Normal range of motion. He exhibits no edema or tenderness.  Neurological: He is alert and oriented to person, place, and time. Gait normal.  Skin: Skin is warm and dry.  Psychiatric: Mood, memory, affect and judgment normal.    Lab Results  Component Value Date   WBC 5.2 11/19/2014   HGB 15.8 07/02/2014   HCT 46.7 11/19/2014   PLT 134* 07/02/2014   GLUCOSE 136* 05/17/2013   CHOL 144 09/18/2013   TRIG 64 09/18/2013   HDL 50 09/18/2013   LDLCALC 81 09/18/2013   TSH 3.54 09/18/2013   PSA 1.6 09/18/2013    CMP     Component Value Date/Time   NA 148* 09/18/2013   NA 140 05/17/2013 0638   K 5.5* 09/18/2013   K 4.7 05/17/2013 0638   CL 102 05/17/2013 0638   CO2 34* 05/17/2013 0638   GLUCOSE 136* 05/17/2013 0638   BUN 25* 09/18/2013   BUN 53* 05/17/2013 0638   CREATININE 1.6* 09/18/2013   CREATININE 2.56* 05/18/2013 0412   CALCIUM 8.5 05/17/2013 0638   AST 29 09/18/2013   ALT 30 09/18/2013   ALKPHOS 101 09/18/2013   GFRNONAA 23* 05/18/2013 0412   GFRAA 26* 05/18/2013 0412    Assessment and Plan :  1. Gastroesophageal reflux disease, esophagitis presence not specified Per patient per Dr. Cherylann Ratel patient was advised to not take anything else but Zantac. No changes today.  Follow. - CBC with Differential/Platelet - COMPLETE METABOLIC PANEL WITH GFR Refer to GI 2. Congestive heart failure, unspecified congestive heart failure chronicity, unspecified congestive heart failure type I would like for patient to see cardiology and pulmonology. Pending lab results. - Pro b natriuretic peptide (BNP)  3. Shortness of breath EKG shows irregular rhythm otherwise stable. If breathing gets worse will refer back to Dr. Welton Flakes. - EKG 12-Lead Refer back to pulmonology 4. Periumbilical abdominal pain Will refer to  GI for further evaluation. Not sure of the exact etiology. Patient is describing a water brash type symptoms. Abdominal symptoms are very vague and nonspecific. - CBC with Differential/Platelet - COMPLETE METABOLIC PANEL WITH GFR - Ambulatory referral to Gastroenterology 5. PVC Appears benign 6. Hoarseness GI referral pending. Could be referral back to ENT. As was done at an earlier time for the hoarseness several years ago. I have done the exam and reviewed the above chart and it is accurate to the best of my knowledge.    Patient was seen and examined by Dr. Bosie Clos and note was scribed by Samara Deist, RMA.    Julieanne Manson MD [facility] 12/31/2014 11:24 AM

## 2015-01-01 LAB — COMPREHENSIVE METABOLIC PANEL
A/G RATIO: 1.4 (ref 1.1–2.5)
ALK PHOS: 81 IU/L (ref 39–117)
ALT: 15 IU/L (ref 0–44)
AST: 20 IU/L (ref 0–40)
Albumin: 4 g/dL (ref 3.5–4.7)
BUN/Creatinine Ratio: 12 (ref 10–22)
BUN: 20 mg/dL (ref 8–27)
Bilirubin Total: 0.7 mg/dL (ref 0.0–1.2)
CALCIUM: 9.5 mg/dL (ref 8.6–10.2)
CHLORIDE: 104 mmol/L (ref 97–108)
CO2: 30 mmol/L — ABNORMAL HIGH (ref 18–29)
Creatinine, Ser: 1.62 mg/dL — ABNORMAL HIGH (ref 0.76–1.27)
GFR calc Af Amer: 45 mL/min/{1.73_m2} — ABNORMAL LOW (ref >59)
GFR, EST NON AFRICAN AMERICAN: 39 mL/min/{1.73_m2} — AB (ref >59)
GLOBULIN, TOTAL: 2.8 g/dL (ref 1.5–4.5)
Glucose: 64 mg/dL — ABNORMAL LOW (ref 65–99)
POTASSIUM: 5.5 mmol/L — AB (ref 3.5–5.2)
SODIUM: 148 mmol/L — AB (ref 134–144)
Total Protein: 6.8 g/dL (ref 6.0–8.5)

## 2015-01-01 LAB — CBC WITH DIFFERENTIAL/PLATELET
BASOS ABS: 0 10*3/uL (ref 0.0–0.2)
Basos: 0 %
EOS (ABSOLUTE): 0.2 10*3/uL (ref 0.0–0.4)
Eos: 3 %
Hematocrit: 49 % (ref 37.5–51.0)
Hemoglobin: 16 g/dL (ref 12.6–17.7)
IMMATURE GRANS (ABS): 0 10*3/uL (ref 0.0–0.1)
IMMATURE GRANULOCYTES: 0 %
LYMPHS: 31 %
Lymphocytes Absolute: 1.9 10*3/uL (ref 0.7–3.1)
MCH: 31.2 pg (ref 26.6–33.0)
MCHC: 32.7 g/dL (ref 31.5–35.7)
MCV: 96 fL (ref 79–97)
MONOS ABS: 0.8 10*3/uL (ref 0.1–0.9)
Monocytes: 13 %
NEUTROS ABS: 3.3 10*3/uL (ref 1.4–7.0)
NEUTROS PCT: 53 %
PLATELETS: 134 10*3/uL — AB (ref 150–379)
RBC: 5.13 x10E6/uL (ref 4.14–5.80)
RDW: 13.6 % (ref 12.3–15.4)
WBC: 6.1 10*3/uL (ref 3.4–10.8)

## 2015-01-01 LAB — PRO B NATRIURETIC PEPTIDE: NT-PRO BNP: 94 pg/mL (ref 0–486)

## 2015-01-02 ENCOUNTER — Telehealth: Payer: Self-pay | Admitting: Family Medicine

## 2015-01-02 NOTE — Telephone Encounter (Signed)
Pt is wanting to get results of blood work °

## 2015-01-03 NOTE — Telephone Encounter (Signed)
Pt advised-aa 

## 2015-01-03 NOTE — Telephone Encounter (Signed)
Labs stable.copy to nephrology

## 2015-04-21 ENCOUNTER — Encounter: Payer: Self-pay | Admitting: Radiology

## 2015-04-21 ENCOUNTER — Emergency Department: Payer: Medicare Other

## 2015-04-21 ENCOUNTER — Inpatient Hospital Stay
Admission: EM | Admit: 2015-04-21 | Discharge: 2015-04-24 | DRG: 190 | Disposition: A | Discharge status: Home Health | Payer: Medicare Other | Attending: Internal Medicine | Admitting: Internal Medicine

## 2015-04-21 DIAGNOSIS — J9602 Acute respiratory failure with hypercapnia: Secondary | ICD-10-CM | POA: Diagnosis present

## 2015-04-21 DIAGNOSIS — N179 Acute kidney failure, unspecified: Secondary | ICD-10-CM | POA: Diagnosis present

## 2015-04-21 DIAGNOSIS — I129 Hypertensive chronic kidney disease with stage 1 through stage 4 chronic kidney disease, or unspecified chronic kidney disease: Secondary | ICD-10-CM | POA: Diagnosis present

## 2015-04-21 DIAGNOSIS — R9389 Abnormal findings on diagnostic imaging of other specified body structures: Secondary | ICD-10-CM

## 2015-04-21 DIAGNOSIS — R0600 Dyspnea, unspecified: Secondary | ICD-10-CM

## 2015-04-21 DIAGNOSIS — Z79899 Other long term (current) drug therapy: Secondary | ICD-10-CM | POA: Diagnosis not present

## 2015-04-21 DIAGNOSIS — J441 Chronic obstructive pulmonary disease with (acute) exacerbation: Secondary | ICD-10-CM

## 2015-04-21 DIAGNOSIS — J9600 Acute respiratory failure, unspecified whether with hypoxia or hypercapnia: Secondary | ICD-10-CM | POA: Diagnosis not present

## 2015-04-21 DIAGNOSIS — I2510 Atherosclerotic heart disease of native coronary artery without angina pectoris: Secondary | ICD-10-CM | POA: Diagnosis present

## 2015-04-21 DIAGNOSIS — J189 Pneumonia, unspecified organism: Secondary | ICD-10-CM | POA: Diagnosis present

## 2015-04-21 DIAGNOSIS — M810 Age-related osteoporosis without current pathological fracture: Secondary | ICD-10-CM | POA: Diagnosis present

## 2015-04-21 DIAGNOSIS — K219 Gastro-esophageal reflux disease without esophagitis: Secondary | ICD-10-CM | POA: Diagnosis present

## 2015-04-21 DIAGNOSIS — N184 Chronic kidney disease, stage 4 (severe): Secondary | ICD-10-CM | POA: Diagnosis present

## 2015-04-21 DIAGNOSIS — J9601 Acute respiratory failure with hypoxia: Secondary | ICD-10-CM | POA: Diagnosis present

## 2015-04-21 DIAGNOSIS — J440 Chronic obstructive pulmonary disease with acute lower respiratory infection: Secondary | ICD-10-CM | POA: Diagnosis present

## 2015-04-21 DIAGNOSIS — R0902 Hypoxemia: Secondary | ICD-10-CM

## 2015-04-21 DIAGNOSIS — R918 Other nonspecific abnormal finding of lung field: Secondary | ICD-10-CM | POA: Diagnosis present

## 2015-04-21 DIAGNOSIS — J45909 Unspecified asthma, uncomplicated: Secondary | ICD-10-CM | POA: Diagnosis present

## 2015-04-21 DIAGNOSIS — Z9981 Dependence on supplemental oxygen: Secondary | ICD-10-CM

## 2015-04-21 DIAGNOSIS — R938 Abnormal findings on diagnostic imaging of other specified body structures: Secondary | ICD-10-CM | POA: Diagnosis not present

## 2015-04-21 LAB — CBC WITH DIFFERENTIAL/PLATELET
BASOS PCT: 1 %
Basophils Absolute: 0.1 10*3/uL (ref 0–0.1)
Eosinophils Absolute: 0 10*3/uL (ref 0–0.7)
Eosinophils Relative: 0 %
HEMATOCRIT: 47.9 % (ref 40.0–52.0)
HEMOGLOBIN: 15.2 g/dL (ref 13.0–18.0)
Lymphocytes Relative: 10 %
Lymphs Abs: 1.2 10*3/uL (ref 1.0–3.6)
MCH: 30 pg (ref 26.0–34.0)
MCHC: 31.6 g/dL — AB (ref 32.0–36.0)
MCV: 94.9 fL (ref 80.0–100.0)
MONOS PCT: 11 %
Monocytes Absolute: 1.3 10*3/uL — ABNORMAL HIGH (ref 0.2–1.0)
NEUTROS ABS: 9.3 10*3/uL — AB (ref 1.4–6.5)
NEUTROS PCT: 78 %
Platelets: 214 10*3/uL (ref 150–440)
RBC: 5.05 MIL/uL (ref 4.40–5.90)
RDW: 14 % (ref 11.5–14.5)
WBC: 12 10*3/uL — AB (ref 3.8–10.6)

## 2015-04-21 LAB — BLOOD GAS, ARTERIAL
ACID-BASE EXCESS: 4.9 mmol/L — AB (ref 0.0–3.0)
ALLENS TEST (PASS/FAIL): POSITIVE — AB
Acid-Base Excess: 8.2 mmol/L — ABNORMAL HIGH (ref 0.0–3.0)
Allens test (pass/fail): POSITIVE — AB
Bicarbonate: 39.9 mEq/L — ABNORMAL HIGH (ref 21.0–28.0)
Bicarbonate: 34.6 mEq/L — ABNORMAL HIGH (ref 21.0–28.0)
DELIVERY SYSTEMS: POSITIVE
Expiratory PAP: 6
FIO2: 0.36
FIO2: 0.3
Inspiratory PAP: 14
Mechanical Rate: 12
O2 SAT: 97.1 %
O2 Saturation: 93.5 %
PATIENT TEMPERATURE: 37
PCO2 ART: 77 mmHg — AB (ref 32.0–48.0)
PO2 ART: 79 mmHg — AB (ref 83.0–108.0)
Patient temperature: 37
pCO2 arterial: 93 mmHg (ref 32.0–48.0)
pH, Arterial: 7.24 — ABNORMAL LOW (ref 7.350–7.450)
pH, Arterial: 7.26 — ABNORMAL LOW (ref 7.350–7.450)
pO2, Arterial: 106 mmHg (ref 83.0–108.0)

## 2015-04-21 LAB — BRAIN NATRIURETIC PEPTIDE: B Natriuretic Peptide: 113 pg/mL — ABNORMAL HIGH (ref 0.0–100.0)

## 2015-04-21 LAB — BASIC METABOLIC PANEL
ANION GAP: 5 (ref 5–15)
BUN: 22 mg/dL — ABNORMAL HIGH (ref 6–20)
CHLORIDE: 107 mmol/L (ref 101–111)
CO2: 33 mmol/L — AB (ref 22–32)
CREATININE: 1.76 mg/dL — AB (ref 0.61–1.24)
Calcium: 8.8 mg/dL — ABNORMAL LOW (ref 8.9–10.3)
GFR calc non Af Amer: 34 mL/min — ABNORMAL LOW (ref >60)
GFR, EST AFRICAN AMERICAN: 40 mL/min — AB (ref >60)
Glucose, Bld: 171 mg/dL — ABNORMAL HIGH (ref 65–99)
POTASSIUM: 4.6 mmol/L (ref 3.5–5.1)
Sodium: 145 mmol/L (ref 135–145)

## 2015-04-21 LAB — HEMOGLOBIN A1C: HEMOGLOBIN A1C: 5.1 % (ref 4.0–6.0)

## 2015-04-21 LAB — MRSA PCR SCREENING: MRSA BY PCR: NEGATIVE

## 2015-04-21 LAB — GLUCOSE, CAPILLARY
GLUCOSE-CAPILLARY: 221 mg/dL — AB (ref 65–99)
GLUCOSE-CAPILLARY: 139 mg/dL — AB (ref 65–99)

## 2015-04-21 LAB — TROPONIN I: TROPONIN I: 0.03 ng/mL (ref <0.031)

## 2015-04-21 MED ORDER — ACETAMINOPHEN 650 MG RE SUPP: 650.0000 mg | Freq: Four times a day (QID) | RECTAL | Status: DC | PRN

## 2015-04-21 MED ORDER — DOCUSATE SODIUM 100 MG PO CAPS
100.0000 mg | ORAL_CAPSULE | Freq: Two times a day (BID) | ORAL | Status: DC
  Administered 2015-04-21 – 2015-04-24 (×6): 100 mg via ORAL
  Filled 2015-04-21 (×7): qty 1

## 2015-04-21 MED ORDER — IRBESARTAN 75 MG PO TABS
75.0000 mg | ORAL_TABLET | Freq: Every day | ORAL | Status: DC
  Administered 2015-04-21: 75 mg via ORAL
  Filled 2015-04-21: qty 1

## 2015-04-21 MED ORDER — FAMOTIDINE 20 MG PO TABS
20.0000 mg | ORAL_TABLET | Freq: Two times a day (BID) | ORAL | Status: DC
  Administered 2015-04-21 – 2015-04-23 (×4): 20 mg via ORAL
  Filled 2015-04-21 (×5): qty 1

## 2015-04-21 MED ORDER — MORPHINE SULFATE (PF) 2 MG/ML IV SOLN: 2.0000 mg | INTRAVENOUS | Status: DC | PRN

## 2015-04-21 MED ORDER — ALLOPURINOL 100 MG PO TABS: 150.0000 mg | ORAL_TABLET | Freq: Every day | ORAL | Status: DC | PRN

## 2015-04-21 MED ORDER — BISACODYL 10 MG RE SUPP: 10.0000 mg | Freq: Every day | RECTAL | Status: DC | PRN

## 2015-04-21 MED ORDER — METHYLPREDNISOLONE SODIUM SUCC 125 MG IJ SOLR
60.0000 mg | Freq: Four times a day (QID) | INTRAMUSCULAR | Status: DC
  Administered 2015-04-21 – 2015-04-22 (×4): 60 mg via INTRAVENOUS
  Filled 2015-04-21 (×4): qty 2

## 2015-04-21 MED ORDER — SODIUM CHLORIDE 0.9 % IJ SOLN
3.0000 mL | Freq: Two times a day (BID) | INTRAMUSCULAR | Status: DC
  Administered 2015-04-21 – 2015-04-24 (×6): 3 mL via INTRAVENOUS

## 2015-04-21 MED ORDER — IPRATROPIUM-ALBUTEROL 0.5-2.5 (3) MG/3ML IN SOLN
3.0000 mL | Freq: Once | RESPIRATORY_TRACT | Status: AC
  Administered 2015-04-21: 3 mL via RESPIRATORY_TRACT
  Filled 2015-04-21: qty 3

## 2015-04-21 MED ORDER — ACETAMINOPHEN 325 MG PO TABS: 650.0000 mg | ORAL_TABLET | Freq: Four times a day (QID) | ORAL | Status: DC | PRN

## 2015-04-21 MED ORDER — INSULIN ASPART 100 UNIT/ML ~~LOC~~ SOLN
0.0000 [IU] | Freq: Three times a day (TID) | SUBCUTANEOUS | Status: DC
  Administered 2015-04-21: 3 [IU] via SUBCUTANEOUS
  Administered 2015-04-21 – 2015-04-23 (×6): 1 [IU] via SUBCUTANEOUS
  Filled 2015-04-21 (×3): qty 1
  Filled 2015-04-21: qty 3
  Filled 2015-04-21 (×4): qty 1

## 2015-04-21 MED ORDER — CEFTRIAXONE SODIUM 1 G IJ SOLR
1.0000 g | INTRAMUSCULAR | Status: DC
  Administered 2015-04-21 – 2015-04-22 (×2): 1 g via INTRAVENOUS
  Filled 2015-04-21 (×3): qty 10

## 2015-04-21 MED ORDER — HEPARIN SODIUM (PORCINE) 5000 UNIT/ML IJ SOLN
5000.0000 [IU] | Freq: Three times a day (TID) | INTRAMUSCULAR | Status: DC
  Administered 2015-04-21 – 2015-04-23 (×6): 5000 [IU] via SUBCUTANEOUS
  Filled 2015-04-21 (×6): qty 1

## 2015-04-21 MED ORDER — SODIUM CHLORIDE 0.45 % IV SOLN
INTRAVENOUS | Status: DC
  Administered 2015-04-21 – 2015-04-23 (×4): via INTRAVENOUS

## 2015-04-21 MED ORDER — ENALAPRIL MALEATE 5 MG PO TABS
2.5000 mg | ORAL_TABLET | Freq: Every day | ORAL | Status: DC
  Filled 2015-04-21: qty 1

## 2015-04-21 MED ORDER — CHLORHEXIDINE GLUCONATE 0.12 % MT SOLN
15.0000 mL | Freq: Two times a day (BID) | OROMUCOSAL | Status: DC
  Administered 2015-04-21 – 2015-04-24 (×7): 15 mL via OROMUCOSAL
  Filled 2015-04-21 (×4): qty 15

## 2015-04-21 MED ORDER — ASPIRIN EC 81 MG PO TBEC
81.0000 mg | DELAYED_RELEASE_TABLET | Freq: Every day | ORAL | Status: DC
  Administered 2015-04-21 – 2015-04-24 (×4): 81 mg via ORAL
  Filled 2015-04-21 (×4): qty 1

## 2015-04-21 MED ORDER — IRBESARTAN 75 MG PO TABS: 37.5000 mg | ORAL_TABLET | Freq: Every day | ORAL | Status: DC

## 2015-04-21 MED ORDER — IOHEXOL 350 MG/ML SOLN
80.0000 mL | Freq: Once | INTRAVENOUS | Status: AC | PRN
  Administered 2015-04-21: 80 mL via INTRAVENOUS

## 2015-04-21 MED ORDER — IPRATROPIUM-ALBUTEROL 0.5-2.5 (3) MG/3ML IN SOLN
3.0000 mL | Freq: Four times a day (QID) | RESPIRATORY_TRACT | Status: DC
  Administered 2015-04-21 – 2015-04-23 (×8): 3 mL via RESPIRATORY_TRACT
  Filled 2015-04-21 (×9): qty 3

## 2015-04-21 MED ORDER — ONDANSETRON HCL 4 MG/2ML IJ SOLN: 4.0000 mg | Freq: Four times a day (QID) | INTRAMUSCULAR | Status: DC | PRN

## 2015-04-21 MED ORDER — CETYLPYRIDINIUM CHLORIDE 0.05 % MT LIQD
7.0000 mL | Freq: Two times a day (BID) | OROMUCOSAL | Status: DC
  Administered 2015-04-21 – 2015-04-23 (×4): 7 mL via OROMUCOSAL

## 2015-04-21 MED ORDER — DEXTROSE 5 % IV SOLN
500.0000 mg | INTRAVENOUS | Status: DC
  Administered 2015-04-21 – 2015-04-22 (×2): 500 mg via INTRAVENOUS
  Filled 2015-04-21 (×3): qty 500

## 2015-04-21 MED ORDER — ONDANSETRON HCL 4 MG PO TABS: 4.0000 mg | ORAL_TABLET | Freq: Four times a day (QID) | ORAL | Status: DC | PRN

## 2015-04-21 NOTE — ED Notes (Signed)
Pt here for SOB and low oxygen saturations at home

## 2015-04-21 NOTE — Consult Note (Signed)
Pulmonary Critical Care  Initial Consult Note   Jack Jenkins WUJ:[id number] DOB: 07-12-1930 DOA: 04/21/2015  Referring physician: Aram Beecham, MD PCP: Megan Mans, MD   Chief Complaint: Shortness of Breath  HPI: Jack Jenkins is a 81 y.o. male with prior history of pulmonary nodules COPD CKD presented to the hospital with increased shortness of breath. Patient has been ill for about a week. Patient has been having incrfeased SOB noted increased cough noted and he has had wheezing. No fever is noted. Patient has no chest pain noted. Patient was noted to be hypoxic on presentation to the ED and was started on BIPAP. Initial pH was noted to be 7.24 and there was no follow up ABG ordered. Patient is doing a little better on BIPAP. In addition he had a CT angio done which shows multiple pulmonary nodules. These are not new since 2012 however there is one nodule that is of concern and will warrant further follow up on serial scans.    Review of Systems:  Complete ROS performed and is unremarkable other than noted in HPI  History reviewed. No pertinent past medical history. Past Surgical History  Procedure Laterality Date  . Cataract extraction Bilateral   . Cholecystectomy    . Back surgery    . Hernia repair    . Polypectomy      colon  . Laminectomy    . Upper gi endoscopy  01/15/04   Social History:  reports that he has never smoked. He has never used smokeless tobacco. He reports that he does not drink alcohol or use illicit drugs.  No Known Allergies  Family History  Problem Relation Age of Onset  . Heart failure Mother   . Heart attack Father   . CAD Sister   . Lung cancer Brother   . CAD Sister     Prior to Admission medications   Medication Sig Start Date End Date Taking? Authorizing Provider  allopurinol (ZYLOPRIM) 100 MG tablet Take 150 mg by mouth daily as needed (for gout.).  09/03/14  Yes Historical Provider, MD  enalapril (VASOTEC) 2.5 MG tablet Take 1 tablet  (2.5 mg total) by mouth daily. Patient taking differently: Take 2.5 mg by mouth daily as needed (for kidneys.).  12/17/14  Yes Anola Gurney, PA  ranitidine (ZANTAC) 150 MG tablet Take 1 tablet (150 mg total) by mouth 2 (two) times daily. Patient taking differently: Take 150 mg by mouth 2 (two) times daily as needed for heartburn.  12/17/14  Yes Anola Gurney, PA   Physical Exam: Filed Vitals:   04/21/15 1400 04/21/15 1517 04/21/15 1555 04/21/15 1600  BP: 108/82 140/99  117/51  Pulse: 116 105  102  Temp:  98.1 F (36.7 C)    TempSrc:  Axillary    Resp: Height:   (1.753 m)    Weight:  96.1 kg (211 lb 13.8 oz)    SpO2: 92% 96% 96% 90%    Wt Readings from Last 3 Encounters:  04/21/15 96.1 kg (211 lb 13.8 oz)  12/31/14 95.709 kg (211 lb)  12/17/14 97.251 kg (214 lb 6.4 oz)    General:  Appears calm and comfortable with BIPAP on facec Eyes: PERRL, normal lids, irises & conjunctiva ENT: grossly normal hearing, lips & tongue Neck: no LAD, masses or thyromegaly Cardiovascular: RRR, no m/r/g. No Jack Jenkins Respiratory: diminished BS bilaterally. Normal respiratory effort. Abdomen: soft, nontender Skin: no rash or induration seen on limited  exam Musculoskeletal: grossly normal tone BUE/BLE Psychiatric: grossly normal mood and affect Neurologic: grossly non-focal.          Labs on Admission:  Basic Metabolic Panel:  Recent Labs Lab 04/21/15 0809  NA 145  K 4.6  CL 107  CO2 33*  GLUCOSE 171*  BUN 22*  CREATININE 1.76*  CALCIUM 8.8*   Liver Function Tests: No results for input(s): AST, ALT, ALKPHOS, BILITOT, PROT, ALBUMIN in the last 168 hours. No results for input(s): LIPASE, AMYLASE in the last 168 hours. No results for input(s): AMMONIA in the last 168 hours. CBC:  Recent Labs Lab 04/21/15 0809  WBC 12.0*  NEUTROABS 9.3*  HGB 15.2  HCT 47.9  MCV 94.9  PLT 214   Cardiac Enzymes:  Recent Labs Lab 04/21/15 0809  TROPONINI 0.03    BNP (last  3 results)  Recent Labs  04/21/15 0809  BNP 113.0*    ProBNP (last 3 results)  Recent Labs  12/31/14 1204  PROBNP 94    CBG:  Recent Labs Lab 04/21/15 1524  GLUCAP 221*    Radiological Exams on Admission: Dg Chest 2 View  04/21/2015  CLINICAL DATA:  Shortness of breath with cough EXAM: CHEST  2 VIEW COMPARISON:  November 19, 2014 FINDINGS: There is stable elevation of the left hemidiaphragm. There is a stable calcified granuloma in the right base. There is slight bibasilar atelectasis. There is stable slight scarring in the left upper lobe. Lungs elsewhere are clear. Heart size and pulmonary vascular normal. No adenopathy. Bones are osteoporotic. IMPRESSION: Slight bibasilar atelectatic change. No edema or consolidation. Stable granuloma right base. Stable scarring left upper lobe near the apex. Stable elevation left hemidiaphragm. Cardiac silhouette within normal limits. Electronically Signed   By: Bretta Bang III M.D.   On: 04/21/2015 08:47   Ct Angio Chest Pe W/cm &/or Wo Cm  04/21/2015  CLINICAL DATA:  81 year old with acute onset of shortness of breath and tachycardia while at home, hypoxic upon arrival in the emergency department. Current history of asthma, CHF and chronic kidney disease. EXAM: CT ANGIOGRAPHY CHEST WITH CONTRAST TECHNIQUE: Multidetector CT imaging of the chest was performed using the standard protocol during bolus administration of intravenous contrast. Multiplanar CT image reconstructions and MIPs were obtained to evaluate the vascular anatomy. CONTRAST:  80mL OMNIPAQUE IOHEXOL 350 MG/ML IV. COMPARISON:  05/15/2013, 04/13/2011. FINDINGS: Technical quality: Good. Respiratory motion blurred several of the images at the lung bases. Pulmonary embolism:  Absent. Cardiovascular: Heart mildly enlarged with left ventricular predominance. Mild LAD and circumflex coronary artery atherosclerosis. No pericardial effusion. Mild atherosclerosis involving the thoracic aorta  without evidence of aneurysm or dissection. Proximal great vessels patent though atherosclerotic. Direct origin of left vertebral artery from the aortic arch. Mediastinum/Lymph Nodes: Numerous normal-sized mediastinal, hilar and axillary lymph nodes. Some of the normal hilar and mediastinal nodes contain calcification. No pathologic lymphadenopathy. Small of fluid in the mid esophagus. No morphologic abnormality involving the esophagus. No hiatal hernia. Lungs/Pleura: Subsolid nodule in the right upper lobe measuring maximally 2.9 cm (coronal image 94). Subsolid nodule more inferiorly in the central right upper lobe (series 6, image 38) measuring maximally 3.3 cm. Ground-glass opacity peripherally in the left upper lobe, adjacent to the fissure, measuring maximally 3.5 cm. Numerous areas of peribronchovascular nodularity in the right upper lobe, right middle lobe, and to a lesser degree the left upper lobe and both lower lobes. Densely calcified granuloma deep in the right lower lobe. Small bilateral pleural effusions,  right greater than left, with minimal passive atelectasis in the lower lobes. Chronic elevation left hemidiaphragm with chronic scar/atelectasis in the lingula and left lower lobe. Bronchiectasis involving the right lower lobe. Other: Mild to moderate bilateral gynecomastia, left greater than right. Upper abdomen: No acute abnormalities involving the visualized upper abdomen. Musculoskeletal: Degenerative disc disease and spondylosis diffusely throughout the thoracic spine, mild in degree. Review of the MIP images confirms the above findings. IMPRESSION: 1. No evidence of pulmonary embolism. 2. Multiple lung findings as detailed above including subsolid nodules in the right upper lobe, ground-glass opacity peripherally in the left upper lobe, and numerous areas of peribronchovascular nodularity in both lungs. While all of the findings may be related to chronic mycobacterium avium intracellulare  infection, the subsolid right upper lobe nodules have an appearance that can be seen with adenocarcinoma. See below for followup recommendation. 3. Small bilateral pleural effusions, right greater than left, with minimal passive atelectasis in the lower lobes. 4. Mild to moderate bilateral gynecomastia. Initial follow-up by chest CT without contrast is recommended in 3 months to confirm persistence of the subsolid nodules. This recommendation follows the consensus statement: Recommendations for the Management of Subsolid Pulmonary Nodules Detected at CT: A Statement from the Fleischner Society as published in Radiology 2013; 266:304-317. Electronically Signed   By: Hulan Saashomas  Lawrence M.D.   On: 04/21/2015 10:56       EKG: Independently reviewed.  Assessment/Plan Principal Problem:   Acute respiratory failure (HCC) Active Problems:   Dyspnea   COPD exacerbation (HCC)   Abnormal chest CT   1. Acute respiratory failure with hypoxia and hypercapnia -initial ABG looked poor was placed on BIPAP. Patient appears to be doing better -will repeat ABG now -continue with BIPAP overnight  2. COPD with exacerbation -started on IV steroids will continue -will continue with inhalers as you are -continue with nebs -started on empiric antibiotics  3. Pulmonary Nodules -these have been noted since back in 2012 -there is some concern for one of the nodules and this can be followed as an outpatient once he is discharged  4. CKD IV -monitor renal function which appears to be around baseline     Code Status: full code DVT Prophylaxis:heparin Family Communication: none Disposition Plan: home  Time spent: 60min critical care    I have personally obtained a history, examined the patient, evaluated laboratory and imaging results, formulated the assessment and plan and placed orders.  The Patient requires high complexity decision making for assessment and support.    Yevonne PaxSaadat A Khan, MD  [facility] Pulmonary Critical Care Medicine Sleep Medicine

## 2015-04-21 NOTE — ED Provider Notes (Signed)
[facility] Emergency Department Provider Note   ____________________________________________  Time seen: 0805  I have reviewed the triage vital signs and the nursing notes.   HISTORY  Chief Complaint Shortness of Breath   History limited by: Not Limited   HPI Jack Jenkins is a 81 y.o. male who presents to the emergency department today because of concerns for shortness of breath. Patient states that for the past 3 days he has been getting home oxygenation saturation in the 70s. He states that he has felt increasingly short of breath over that same period. He has noticed it is worse with exertion. His also having a slightly harder time lying flat. He has not had any associated chest pain. He has a chronic cough productive of clear fluid. He states this is not significantly changed. He denies any recent fevers.  No past medical history on file.  Patient Active Problem List   Diagnosis Date Noted  . CKD (chronic kidney disease) 12/17/2014  . Photokeratitis 11/06/2014  . Allergic rhinitis 11/06/2014  . AB (asthmatic bronchitis) 11/06/2014  . CCF (congestive cardiac failure) (HCC) 11/06/2014  . Colon polyp 11/06/2014  . Urinary system disease 11/06/2014  . ED (erectile dysfunction) of organic origin 11/06/2014  . Acid reflux 11/06/2014  . Acute gouty arthropathy 11/06/2014  . Gout 11/06/2014  . Arthritis, degenerative 11/06/2014  . Fast heart beat 11/06/2014    Past Surgical History  Procedure Laterality Date  . Cataract extraction Bilateral   . Cholecystectomy    . Back surgery    . Hernia repair    . Polypectomy      colon  . Laminectomy    . Upper gi endoscopy  01/15/04    Current Outpatient Rx  Name  Route  Sig  Dispense  Refill  . allopurinol (ZYLOPRIM) 100 MG tablet               . enalapril (VASOTEC) 2.5 MG tablet   Oral   Take 1 tablet (2.5 mg total) by mouth daily.   90 tablet   1   . ranitidine (ZANTAC) 150 MG tablet    Oral   Take 1 tablet (150 mg total) by mouth 2 (two) times daily.   60 tablet   5     Allergies Review of patient's allergies indicates no known allergies.  Family History  Problem Relation Age of Onset  . Heart failure Mother   . Heart attack Father   . CAD Sister   . Lung cancer Brother   . CAD Sister     Social History Social History  Substance Use Topics  . Smoking status: Never Smoker   . Smokeless tobacco: Never Used  . Alcohol Use: No    Review of Systems  Constitutional: Negative for fever. Cardiovascular: Negative for chest pain. Respiratory: Positive for shortness of breath. Gastrointestinal: Negative for abdominal pain, vomiting and diarrhea. Neurological: Negative for headaches, focal weakness or numbness.   10-point ROS otherwise negative.  ____________________________________________   PHYSICAL EXAM:  VITAL SIGNS: ED Triage Vitals  Enc Vitals Group     BP 04/21/15 0759 120/58 mmHg     Pulse Rate 04/21/15 0759 126     Resp 04/21/15 0759 22     Temp 04/21/15 0759 99.3 F (37.4 C)     Temp Source 04/21/15 0759 Oral     SpO2 04/21/15 0759 77 %     Weight 04/21/15 0759 212 lb (96.163 kg)     Height  04/21/15 0759  (1.778 m)   Constitutional: Alert and oriented. Mild respiratory distress. Eyes: Conjunctivae are normal. PERRL. Normal extraocular movements. ENT   Head: Normocephalic and atraumatic.   Nose: No congestion/rhinnorhea.   Mouth/Throat: Mucous membranes are moist.   Neck: No stridor. Hematological/Lymphatic/Immunilogical: No cervical lymphadenopathy. Cardiovascular: Normal rate, regular rhythm.  No murmurs, rubs, or gallops. Respiratory: Mildly increased respiratory effort and mild respiratory distress. Diminished breath sounds globally. Gastrointestinal: Soft and nontender. No distention. Genitourinary: Deferred Musculoskeletal: Normal range of motion in all extremities. No joint effusions.  No lower extremity  tenderness nor edema. Neurologic:  Normal speech and language. No gross focal neurologic deficits are appreciated.  Skin:  Skin is warm, dry and intact. No rash noted. Psychiatric: Mood and affect are normal. Speech and behavior are normal. Patient exhibits appropriate insight and judgment.  ____________________________________________    LABS (pertinent positives/negatives)  Labs Reviewed  BRAIN NATRIURETIC PEPTIDE - Abnormal; Notable for the following:    B Natriuretic Peptide 113.0 (*)    All other components within normal limits  CBC WITH DIFFERENTIAL/PLATELET - Abnormal; Notable for the following:    WBC 12.0 (*)    MCHC 31.6 (*)    Neutro Abs 9.3 (*)    Monocytes Absolute 1.3 (*)    All other components within normal limits  BASIC METABOLIC PANEL - Abnormal; Notable for the following:    CO2 33 (*)    Glucose, Bld 171 (*)    BUN 22 (*)    Creatinine, Ser 1.76 (*)    Calcium 8.8 (*)    GFR calc non Af Amer 34 (*)    GFR calc Af Amer 40 (*)    All other components within normal limits  BLOOD GAS, ARTERIAL - Abnormal; Notable for the following:    pH, Arterial 7.24 (*)    pCO2 arterial 93 (*)    Bicarbonate 39.9 (*)    Acid-Base Excess 8.2 (*)    Allens test (pass/fail) POSITIVE (*)    All other components within normal limits  GLUCOSE, CAPILLARY - Abnormal; Notable for the following:    Glucose-Capillary 221 (*)    All other components within normal limits  ACID FAST SMEAR+CULTURE W/RFLX ID & SENS  MRSA PCR SCREENING  TROPONIN I  HEMOGLOBIN A1C     ____________________________________________   EKG  I, Phineas Semen, attending physician, personally viewed and interpreted this EKG  EKG Time: 0810 Rate: 129 Rhythm: Sinus tachycardia Axis: right axis deviation Intervals: qtc 468 QRS: RBBB, LPFB ST changes: no st elevation Impression: abnormal ekg ____________________________________________    RADIOLOGY  CXR IMPRESSION: Slight bibasilar  atelectatic change. No edema or consolidation. Stable granuloma right base. Stable scarring left upper lobe near the apex. Stable elevation left hemidiaphragm. Cardiac silhouette within normal limits.  ____________________________________________   PROCEDURES  Procedure(s) performed: None  Critical Care performed: Yes, see critical care note(s)  CRITICAL CARE Performed by: Phineas Semen   Total critical care time: 40 minutes  Critical care time was exclusive of separately billable procedures and treating other patients.  Critical care was necessary to treat or prevent imminent or life-threatening deterioration.  Critical care was time spent personally by me on the following activities: development of treatment plan with patient and/or surrogate as well as nursing, discussions with consultants, evaluation of patient's response to treatment, examination of patient, obtaining history from patient or surrogate, ordering and performing treatments and interventions, ordering and review of laboratory studies, ordering and review of radiographic studies, pulse  oximetry and re-evaluation of patient's condition.  ____________________________________________   INITIAL IMPRESSION / ASSESSMENT AND PLAN / ED COURSE  Pertinent labs & imaging results that were available during my care of the patient were reviewed by me and considered in my medical decision making (see chart for details).  Patient presented to the emergency department today with concerns for shortness of breath. Initial O2 saturation quite poor. Patient did have poor air movement. Workup including CT angiography was done. Unclear etiology of the shortness of breath however some concern for possible infection and the CTA. Additionally possible neoplastic disease. Patient will be admitted to the hospital service for further workup and evaluation.  ____________________________________________   FINAL CLINICAL IMPRESSION(S) / ED  DIAGNOSES  Final diagnoses:  Dyspnea  Hypoxia     Phineas SemenGraydon Massie Cogliano, MD 04/21/15 [phone]

## 2015-04-21 NOTE — Progress Notes (Signed)
Admission note and family  state Dr. Carolynne EdouardSadaat Khan is patient's Pulmonary MD. I notified Dr. Welton FlakesKhan of patient's admission and he says he is not familiar with this patient. [facility] has been notified earlier of patient's admission and Pulmonary consult.

## 2015-04-21 NOTE — H&P (Signed)
History and Physical    Jack Jenkins ZOX:[id number] DOB: August 16, 1930 DOA: 04/21/2015  Referring physician: Dr. Derrill Kay PCP: Megan Mans, MD  Specialists: Dr. Welton Flakes  Chief Complaint: SOB  HPI: Jack Jenkins is a 81 y.o. male has a past medical history significant for COPD/asthma who was previously on home O2 follwed by Pulmonology now with 1-2 week hx of worsening dyspnea and fatigue. Some cough. No fever or CP. In ER, pt was hypoxic with sat=70% on RA. CT chest shows multiple small pulmonary nodules worrisome for adenocarcinoma or possible MAI. He is now admitted for further evaluation. Denies night sweats or weight loss.  Review of Systems: The patient denies anorexia, fever, weight loss,, vision loss, decreased hearing, hoarseness, chest pain, syncope, peripheral edema, balance deficits, hemoptysis, abdominal pain, melena, hematochezia, severe indigestion/heartburn, hematuria, incontinence, genital sores, muscle weakness, suspicious skin lesions, transient blindness, difficulty walking, depression, unusual weight change, abnormal bleeding, enlarged lymph nodes, angioedema, and breast masses.   History reviewed. No pertinent past medical history. Past Surgical History  Procedure Laterality Date  . Cataract extraction Bilateral   . Cholecystectomy    . Back surgery    . Hernia repair    . Polypectomy      colon  . Laminectomy    . Upper gi endoscopy  01/15/04   Social History:  reports that he has never smoked. He has never used smokeless tobacco. He reports that he does not drink alcohol or use illicit drugs.  No Known Allergies  Family History  Problem Relation Age of Onset  . Heart failure Mother   . Heart attack Father   . CAD Sister   . Lung cancer Brother   . CAD Sister     Prior to Admission medications   Medication Sig Start Date End Date Taking? Authorizing Provider  allopurinol (ZYLOPRIM) 100 MG tablet Take 150 mg by mouth daily as needed (for gout.).  09/03/14   Yes Historical Provider, MD  enalapril (VASOTEC) 2.5 MG tablet Take 1 tablet (2.5 mg total) by mouth daily. Patient taking differently: Take 2.5 mg by mouth daily as needed (for kidneys.).  12/17/14  Yes Anola Gurney, PA  ranitidine (ZANTAC) 150 MG tablet Take 1 tablet (150 mg total) by mouth 2 (two) times daily. Patient taking differently: Take 150 mg by mouth 2 (two) times daily as needed for heartburn.  12/17/14  Yes Anola Gurney, PA   Physical Exam: Filed Vitals:   04/21/15 0900 04/21/15 0930 04/21/15 1000 04/21/15 1119  BP: 151/84 168/85 170/86 127/66  Pulse: 117 108 108 109  Temp:      TempSrc:      Resp: Height:      Weight:      SpO2: 95% 89% 87% 96%     General:  Acutely ill appearing in moderate respiratory distress, lethargic  Eyes: PERRL, EOMI, no scleral icterus  ENT: dry oropharynx without exudate  Neck: supple, no lymphadenopathy. No thyromegaly  Cardiovascular: rapid rate with regular rhythm without MRG; 2+ peripheral pulses, no JVD, no peripheral edema  Respiratory: decreased breath sounds with diffuse rhonchi. No wheezes or rales.  Abdomen: soft, non tender to palpation, positive bowel sounds, no guarding, no rebound  Skin: no rashes  Musculoskeletal: normal bulk and tone, no joint swelling  Psychiatric: normal mood and affect  Neurologic: CN 2-12 grossly intact, MS 5/5 in all 4  Labs on Admission:  Basic Metabolic Panel:  Recent Labs Lab 04/21/15 0809  NA 145  K 4.6  CL 107  CO2 33*  GLUCOSE 171*  BUN 22*  CREATININE 1.76*  CALCIUM 8.8*   Liver Function Tests: No results for input(s): AST, ALT, ALKPHOS, BILITOT, PROT, ALBUMIN in the last 168 hours. No results for input(s): LIPASE, AMYLASE in the last 168 hours. No results for input(s): AMMONIA in the last 168 hours. CBC:  Recent Labs Lab 04/21/15 0809  WBC 12.0*  NEUTROABS 9.3*  HGB 15.2  HCT 47.9  MCV 94.9  PLT 214   Cardiac Enzymes:  Recent Labs Lab  04/21/15 0809  TROPONINI 0.03    BNP (last 3 results)  Recent Labs  04/21/15 0809  BNP 113.0*    ProBNP (last 3 results)  Recent Labs  12/31/14 1204  PROBNP 94    CBG: No results for input(s): GLUCAP in the last 168 hours.  Radiological Exams on Admission: Dg Chest 2 View  04/21/2015  CLINICAL DATA:  Shortness of breath with cough EXAM: CHEST  2 VIEW COMPARISON:  November 19, 2014 FINDINGS: There is stable elevation of the left hemidiaphragm. There is a stable calcified granuloma in the right base. There is slight bibasilar atelectasis. There is stable slight scarring in the left upper lobe. Lungs elsewhere are clear. Heart size and pulmonary vascular normal. No adenopathy. Bones are osteoporotic. IMPRESSION: Slight bibasilar atelectatic change. No edema or consolidation. Stable granuloma right base. Stable scarring left upper lobe near the apex. Stable elevation left hemidiaphragm. Cardiac silhouette within normal limits. Electronically Signed   By: Bretta BangWilliam  Woodruff III M.D.   On: 04/21/2015 08:47   Ct Angio Chest Pe W/cm &/or Wo Cm  04/21/2015  CLINICAL DATA:  81 year old with acute onset of shortness of breath and tachycardia while at home, hypoxic upon arrival in the emergency department. Current history of asthma, CHF and chronic kidney disease. EXAM: CT ANGIOGRAPHY CHEST WITH CONTRAST TECHNIQUE: Multidetector CT imaging of the chest was performed using the standard protocol during bolus administration of intravenous contrast. Multiplanar CT image reconstructions and MIPs were obtained to evaluate the vascular anatomy. CONTRAST:  80mL OMNIPAQUE IOHEXOL 350 MG/ML IV. COMPARISON:  05/15/2013, 04/13/2011. FINDINGS: Technical quality: Good. Respiratory motion blurred several of the images at the lung bases. Pulmonary embolism:  Absent. Cardiovascular: Heart mildly enlarged with left ventricular predominance. Mild LAD and circumflex coronary artery atherosclerosis. No pericardial  effusion. Mild atherosclerosis involving the thoracic aorta without evidence of aneurysm or dissection. Proximal great vessels patent though atherosclerotic. Direct origin of left vertebral artery from the aortic arch. Mediastinum/Lymph Nodes: Numerous normal-sized mediastinal, hilar and axillary lymph nodes. Some of the normal hilar and mediastinal nodes contain calcification. No pathologic lymphadenopathy. Small of fluid in the mid esophagus. No morphologic abnormality involving the esophagus. No hiatal hernia. Lungs/Pleura: Subsolid nodule in the right upper lobe measuring maximally 2.9 cm (coronal image 94). Subsolid nodule more inferiorly in the central right upper lobe (series 6, image 38) measuring maximally 3.3 cm. Ground-glass opacity peripherally in the left upper lobe, adjacent to the fissure, measuring maximally 3.5 cm. Numerous areas of peribronchovascular nodularity in the right upper lobe, right middle lobe, and to a lesser degree the left upper lobe and both lower lobes. Densely calcified granuloma deep in the right lower lobe. Small bilateral pleural effusions, right greater than left, with minimal passive atelectasis in the lower lobes. Chronic elevation left hemidiaphragm with chronic scar/atelectasis in the lingula and left lower lobe. Bronchiectasis involving the right lower lobe. Other: Mild to moderate bilateral gynecomastia, left  greater than right. Upper abdomen: No acute abnormalities involving the visualized upper abdomen. Musculoskeletal: Degenerative disc disease and spondylosis diffusely throughout the thoracic spine, mild in degree. Review of the MIP images confirms the above findings. IMPRESSION: 1. No evidence of pulmonary embolism. 2. Multiple lung findings as detailed above including subsolid nodules in the right upper lobe, ground-glass opacity peripherally in the left upper lobe, and numerous areas of peribronchovascular nodularity in both lungs. While all of the findings may be  related to chronic mycobacterium avium intracellulare infection, the subsolid right upper lobe nodules have an appearance that can be seen with adenocarcinoma. See below for followup recommendation. 3. Small bilateral pleural effusions, right greater than left, with minimal passive atelectasis in the lower lobes. 4. Mild to moderate bilateral gynecomastia. Initial follow-up by chest CT without contrast is recommended in 3 months to confirm persistence of the subsolid nodules. This recommendation follows the consensus statement: Recommendations for the Management of Subsolid Pulmonary Nodules Detected at CT: A Statement from the Fleischner Society as published in Radiology 2013; 266:304-317. Electronically Signed   By: Hulan Saas M.D.   On: 04/21/2015 10:56    EKG: Independently reviewed.  Assessment/Plan Principal Problem:   Acute respiratory failure (HCC) Active Problems:   Dyspnea   COPD exacerbation (HCC)   Abnormal chest CT   Pt is a FULL code. Will admit to floor with telemetry, O2, IV ABX, IV steroids, and SVN's. Check ABG on O2. Consult ID and Pulmonology. Airborne precautions for now. Send sputum for AFB. Repeat labs in AM. Follow sugars with SSI as needed.  Diet: prudent Fluids: 1/2 NS@100  DVT Prophylaxis: SQ Heparin  Code Status: FULL  Family Communication: yes  Disposition Plan: home  Time spent: 55 min

## 2015-04-21 NOTE — Progress Notes (Signed)
eLink Physician-Brief Progress Note Patient Name: Jack Jenkins DOB: 04-Oct-1930 MRN: [id number]   Date of Service  04/21/2015  HPI/Events of Note  Acute resp failure in pt with copd on ACEi requiring bipap  eICU Interventions  Agree with rx but would not use acei as too many of the side effects overlap with aecopd Changed to avapro instead  No ccm interventions needed     Intervention Category Major Interventions: Respiratory failure - evaluation and management  Sandrea HughsMichael Floree Zuniga 04/21/2015, 4:09 PM

## 2015-04-21 NOTE — Progress Notes (Deleted)
eLink Physician-Brief Progress Note Patient Name: Jack Jenkins A Bisono DOB: 03-Jun-1930 MRN: [id number]   Date of Service  04/21/2015  HPI/Events of Note  Acute resp failure pt with copd on acei improving on bipap  eICU Interventions  Agree with rx but would not use acei as too many of the side effects overlap with aecopd Changed to avapro instead  No ccm interventions needed     Intervention Category Major Interventions: Respiratory failure - evaluation and management  Sandrea HughsMichael Kassady Laboy 04/21/2015, 4:04 PM

## 2015-04-21 NOTE — ED Notes (Signed)
Patient transported to X-ray 

## 2015-04-21 NOTE — Progress Notes (Deleted)
eLink Physician-Brief Progress Note Patient Name: Jack Jenkins DOB: 1930/11/13 MRN: [id number]   Date of Service  04/21/2015  HPI/Events of Note  Dr Vanessa BarbaraZamora notified elink pt not tolerating/ compliant with bipap ? Needs trach this admit but has refused  eICU Interventions  Will see if one of our sleep docs can see 12/12 to offer any other alternatives and discuss risk/ benefits in more detail but now nothing else to offer     Intervention Category Major Interventions: Respiratory failure - evaluation and management  Sandrea HughsMichael Raynelle Fujikawa 04/21/2015, 4:07 PM

## 2015-04-22 DIAGNOSIS — R0600 Dyspnea, unspecified: Secondary | ICD-10-CM

## 2015-04-22 DIAGNOSIS — J441 Chronic obstructive pulmonary disease with (acute) exacerbation: Secondary | ICD-10-CM

## 2015-04-22 DIAGNOSIS — R938 Abnormal findings on diagnostic imaging of other specified body structures: Secondary | ICD-10-CM

## 2015-04-22 DIAGNOSIS — J9600 Acute respiratory failure, unspecified whether with hypoxia or hypercapnia: Secondary | ICD-10-CM

## 2015-04-22 LAB — COMPREHENSIVE METABOLIC PANEL
ALK PHOS: 70 U/L (ref 38–126)
ALT: 24 U/L (ref 17–63)
ANION GAP: 6 (ref 5–15)
AST: 20 U/L (ref 15–41)
Albumin: 2.6 g/dL — ABNORMAL LOW (ref 3.5–5.0)
BILIRUBIN TOTAL: 0.9 mg/dL (ref 0.3–1.2)
BUN: 25 mg/dL — ABNORMAL HIGH (ref 6–20)
CALCIUM: 8.4 mg/dL — AB (ref 8.9–10.3)
CO2: 30 mmol/L (ref 22–32)
CREATININE: 1.49 mg/dL — AB (ref 0.61–1.24)
Chloride: 106 mmol/L (ref 101–111)
GFR, EST AFRICAN AMERICAN: 49 mL/min — AB (ref >60)
GFR, EST NON AFRICAN AMERICAN: 42 mL/min — AB (ref >60)
Glucose, Bld: 157 mg/dL — ABNORMAL HIGH (ref 65–99)
Potassium: 4.9 mmol/L (ref 3.5–5.1)
Sodium: 142 mmol/L (ref 135–145)
TOTAL PROTEIN: 6.2 g/dL — AB (ref 6.5–8.1)

## 2015-04-22 LAB — CBC
HEMATOCRIT: 42.5 % (ref 40.0–52.0)
HEMOGLOBIN: 13.8 g/dL (ref 13.0–18.0)
MCH: 31 pg (ref 26.0–34.0)
MCHC: 32.4 g/dL (ref 32.0–36.0)
MCV: 95.7 fL (ref 80.0–100.0)
Platelets: 181 10*3/uL (ref 150–440)
RBC: 4.44 MIL/uL (ref 4.40–5.90)
RDW: 14.2 % (ref 11.5–14.5)
WBC: 10.4 10*3/uL (ref 3.8–10.6)

## 2015-04-22 LAB — GLUCOSE, CAPILLARY
GLUCOSE-CAPILLARY: 131 mg/dL — AB (ref 65–99)
GLUCOSE-CAPILLARY: 136 mg/dL — AB (ref 65–99)
GLUCOSE-CAPILLARY: 135 mg/dL — AB (ref 65–99)

## 2015-04-22 MED ORDER — METHYLPREDNISOLONE SODIUM SUCC 125 MG IJ SOLR
60.0000 mg | INTRAMUSCULAR | Status: DC
  Administered 2015-04-23: 60 mg via INTRAVENOUS
  Filled 2015-04-22: qty 2

## 2015-04-22 MED ORDER — MORPHINE SULFATE (PF) 2 MG/ML IV SOLN: 1.0000 mg | INTRAVENOUS | Status: DC | PRN

## 2015-04-22 MED ORDER — ENALAPRIL MALEATE 5 MG PO TABS
2.5000 mg | ORAL_TABLET | Freq: Every day | ORAL | Status: DC
  Administered 2015-04-22 – 2015-04-23 (×2): 2.5 mg via ORAL
  Filled 2015-04-22 (×2): qty 1

## 2015-04-22 MED ORDER — BUDESONIDE 0.5 MG/2ML IN SUSP
0.5000 mg | Freq: Two times a day (BID) | RESPIRATORY_TRACT | Status: DC
  Administered 2015-04-22 – 2015-04-24 (×5): 0.5 mg via RESPIRATORY_TRACT
  Filled 2015-04-22 (×5): qty 2

## 2015-04-22 NOTE — Evaluation (Signed)
Physical Therapy Evaluation Patient Details Name: Jack Jenkins A Hagger MRN: [id number] DOB: April 22, 1931 Today's Date: 04/22/2015   History of Present Illness  presented to ER secondary to progressively worsening SOB, dyspnea; admitted with acute respiratory failure (noted with multiple pulmonary nodules, suspicious for adenocarcinoma vs. possible MAI) requiring BiPAP.  Clinical Impression  Upon evaluation, patient alert and oriented; follows all commands and demonstrates good safety awareness/insight.  Bilat UE/LE strength and ROM grossly WFL; no pain reported.  Currently performing bed mobility and self-repositioning in bed as needed at mod indep level; additional OOB activities deferred at this time, as patient currently remains on BiPAP for respiratory support.  Good tolerance for LE therex with manual resistance as appropriate.  Will progress activity in subsequent sessions as appropriate. Vitals stable and WFL; minimal DOE noted during session. Would benefit from skilled PT to address above deficits and promote optimal return to PLOF; Recommend transition to HHPT upon discharge from acute hospitalization (pending additional OOB/mobility assessment).     Follow Up Recommendations Home health PT (pending additional OOB/mobility assessment)    Equipment Recommendations       Recommendations for Other Services       Precautions / Restrictions Precautions Precautions: Fall Restrictions Weight Bearing Restrictions: No      Mobility  Bed Mobility Overal bed mobility: Modified Independent                Transfers                 General transfer comment: defererred this date secondary to respiratory status/currently on BiPAP  Ambulation/Gait             General Gait Details: defererred this date secondary to respiratory status/currently on BiPAP  Stairs            Wheelchair Mobility    Modified Rankin (Stroke Patients Only)       Balance                                              Pertinent Vitals/Pain Pain Assessment: No/denies pain    Home Living Family/patient expects to be discharged to:: Private residence Living Arrangements: Spouse/significant other Available Help at Discharge: Family Type of Home: House Home Access: Level entry     Home Layout: One level        Prior Function Level of Independence: Independent         Comments: Indep for household and community activities without assist device (occasional use of SPC as needed); + driving; denies fall history.     Hand Dominance        Extremity/Trunk Assessment   Upper Extremity Assessment: Overall WFL for tasks assessed           Lower Extremity Assessment: Overall WFL for tasks assessed         Communication   Communication: No difficulties  Cognition Arousal/Alertness: Awake/alert Behavior During Therapy: WFL for tasks assessed/performed Overall Cognitive Status: Within Functional Limits for tasks assessed                      General Comments      Exercises Other Exercises Other Exercises: Supine LE therex, 1x12, AROM for muscular strength/endurance with functional activities (manual resistance applied as appropriate)      Assessment/Plan    PT Assessment Patient needs continued PT services  PT Diagnosis Difficulty walking;Generalized weakness   PT Problem List Decreased activity tolerance;Decreased balance;Decreased mobility;Decreased knowledge of use of DME;Decreased safety awareness;Decreased knowledge of precautions;Cardiopulmonary status limiting activity  PT Treatment Interventions DME instruction;Gait training;Stair training;Functional mobility training;Therapeutic activities;Therapeutic exercise;Patient/family education   PT Goals (Current goals can be found in the Care Plan section) Acute Rehab PT Goals Patient Stated Goal: to breathe easier PT Goal Formulation: With patient Time For Goal  Achievement: 05/06/15 Potential to Achieve Goals: Good Additional Goals Additional Goal #1: Assess and establish goals for OOB activities as appropriate.    Frequency Min 2X/week   Barriers to discharge Decreased caregiver support      Co-evaluation               End of Session   Activity Tolerance: Patient tolerated treatment well Patient left: in bed;with call bell/phone within reach;with family/visitor present           Time: 1610-9604 PT Time Calculation (min) (ACUTE ONLY): 21 min   Charges:   PT Evaluation $Initial PT Evaluation Tier I: 1 Procedure PT Treatments $Therapeutic Exercise: 8-22 mins   PT G Codes:        Halton Neas H. Manson Passey, PT, DPT, NCS 04/22/2015, 1:45 PM [phone]

## 2015-04-22 NOTE — Progress Notes (Signed)
[facility] [place] [facility]   PATIENT NAME: Jack AmesGrady Termini    MR#:  [id number]  DATE OF BIRTH:  March 26, 1931  SUBJECTIVE:  Came in with upper respiratory symptoms for last several days and increasing shortness of breath found to have sats down in the 70s. Currently on BiPAP sats 96-97%. Denies any chest pain or shortness of breath at present.  REVIEW OF SYSTEMS:   Review of Systems  Constitutional: Negative for fever, chills and weight loss.  HENT: Positive for congestion. Negative for ear discharge, ear pain and nosebleeds.   Eyes: Negative for blurred vision, pain and discharge.  Respiratory: Positive for cough and shortness of breath. Negative for sputum production, wheezing and stridor.   Cardiovascular: Negative for chest pain, palpitations, orthopnea and PND.  Gastrointestinal: Negative for nausea, vomiting, abdominal pain and diarrhea.  Genitourinary: Negative for urgency and frequency.  Musculoskeletal: Negative for back pain and joint pain.  Neurological: Positive for weakness. Negative for sensory change, speech change and focal weakness.  Psychiatric/Behavioral: Negative for depression and hallucinations. The patient is not nervous/anxious.   All other systems reviewed and are negative.  Tolerating Diet: Yes Tolerating PT: Pending  DRUG ALLERGIES:  No Known Allergies  VITALS:  Blood pressure 107/60, pulse 83, temperature 98.4 F (36.9 C), temperature source Oral, resp. rate 19, height 5\' 9"  (1.753 m), weight 206 lb 2.1 oz (93.5 kg), SpO2 94 %.  PHYSICAL EXAMINATION:   Physical Exam  GENERAL:  81 y.o.-year-old patient lying in the bed with no acute distress.  EYES: Pupils equal, round, reactive to light and accommodation. No scleral icterus. Extraocular muscles intact.  HEENT: Head atraumatic, normocephalic. Oropharynx and nasopharynx clear.  BiPAP present NECK:  Supple, no jugular venous distention. No thyroid enlargement, no tenderness.   LUNGS: Distant breath sounds bilaterally, no wheezing, rales, rhonchi. No use of accessory muscles of respiration.  CARDIOVASCULAR: S1, S2 normal. No murmurs, rubs, or gallops.  ABDOMEN: Soft, nontender, nondistended. Bowel sounds present. No organomegaly or mass.  EXTREMITIES: No cyanosis, clubbing or edema b/l.    NEUROLOGIC: Cranial nerves II through XII are intact. No focal Motor or sensory deficits b/l.   PSYCHIATRIC: patient is alert and oriented x 3.  SKIN: No obvious rash, lesion, or ulcer.    LABORATORY PANEL:   CBC  Recent Labs Lab 04/22/15 0304  WBC 10.4  HGB 13.8  HCT 42.5  PLT 181    Chemistries   Recent Labs Lab 04/22/15 0304  NA 142  K 4.9  CL 106  CO2 30  GLUCOSE 157*  BUN 25*  CREATININE 1.49*  CALCIUM 8.4*  AST 20  ALT 24  ALKPHOS 70  BILITOT 0.9    Cardiac Enzymes  Recent Labs Lab 04/21/15 0809  TROPONINI 0.03    RADIOLOGY:  Dg Chest 2 View  04/21/2015  CLINICAL DATA:  Shortness of breath with cough EXAM: CHEST  2 VIEW COMPARISON:  November 19, 2014 FINDINGS: There is stable elevation of the left hemidiaphragm. There is a stable calcified granuloma in the right base. There is slight bibasilar atelectasis. There is stable slight scarring in the left upper lobe. Lungs elsewhere are clear. Heart size and pulmonary vascular normal. No adenopathy. Bones are osteoporotic. IMPRESSION: Slight bibasilar atelectatic change. No edema or consolidation. Stable granuloma right base. Stable scarring left upper lobe near the apex. Stable elevation left hemidiaphragm. Cardiac silhouette within normal limits. Electronically Signed   By: Bretta BangWilliam  Woodruff III M.D.   On: 04/21/2015  08:47   Ct Angio Chest Pe W/cm &/or Wo Cm  04/21/2015  CLINICAL DATA:  81 year old with acute onset of shortness of breath and tachycardia while at home, hypoxic upon arrival in the emergency department. Current history of asthma, CHF and chronic kidney disease. EXAM: CT ANGIOGRAPHY  CHEST WITH CONTRAST TECHNIQUE: Multidetector CT imaging of the chest was performed using the standard protocol during bolus administration of intravenous contrast. Multiplanar CT image reconstructions and MIPs were obtained to evaluate the vascular anatomy. CONTRAST:  80mL OMNIPAQUE IOHEXOL 350 MG/ML IV. COMPARISON:  05/15/2013, 04/13/2011. FINDINGS: Technical quality: Good. Respiratory motion blurred several of the images at the lung bases. Pulmonary embolism:  Absent. Cardiovascular: Heart mildly enlarged with left ventricular predominance. Mild LAD and circumflex coronary artery atherosclerosis. No pericardial effusion. Mild atherosclerosis involving the thoracic aorta without evidence of aneurysm or dissection. Proximal great vessels patent though atherosclerotic. Direct origin of left vertebral artery from the aortic arch. Mediastinum/Lymph Nodes: Numerous normal-sized mediastinal, hilar and axillary lymph nodes. Some of the normal hilar and mediastinal nodes contain calcification. No pathologic lymphadenopathy. Small of fluid in the mid esophagus. No morphologic abnormality involving the esophagus. No hiatal hernia. Lungs/Pleura: Subsolid nodule in the right upper lobe measuring maximally 2.9 cm (coronal image 94). Subsolid nodule more inferiorly in the central right upper lobe (series 6, image 38) measuring maximally 3.3 cm. Ground-glass opacity peripherally in the left upper lobe, adjacent to the fissure, measuring maximally 3.5 cm. Numerous areas of peribronchovascular nodularity in the right upper lobe, right middle lobe, and to a lesser degree the left upper lobe and both lower lobes. Densely calcified granuloma deep in the right lower lobe. Small bilateral pleural effusions, right greater than left, with minimal passive atelectasis in the lower lobes. Chronic elevation left hemidiaphragm with chronic scar/atelectasis in the lingula and left lower lobe. Bronchiectasis involving the right lower lobe.  Other: Mild to moderate bilateral gynecomastia, left greater than right. Upper abdomen: No acute abnormalities involving the visualized upper abdomen. Musculoskeletal: Degenerative disc disease and spondylosis diffusely throughout the thoracic spine, mild in degree. Review of the MIP images confirms the above findings. IMPRESSION: 1. No evidence of pulmonary embolism. 2. Multiple lung findings as detailed above including subsolid nodules in the right upper lobe, ground-glass opacity peripherally in the left upper lobe, and numerous areas of peribronchovascular nodularity in both lungs. While all of the findings may be related to chronic mycobacterium avium intracellulare infection, the subsolid right upper lobe nodules have an appearance that can be seen with adenocarcinoma. See below for followup recommendation. 3. Small bilateral pleural effusions, right greater than left, with minimal passive atelectasis in the lower lobes. 4. Mild to moderate bilateral gynecomastia. Initial follow-up by chest CT without contrast is recommended in 3 months to confirm persistence of the subsolid nodules. This recommendation follows the consensus statement: Recommendations for the Management of Subsolid Pulmonary Nodules Detected at CT: A Statement from the Fleischner Society as published in Radiology 2013; 266:304-317. Electronically Signed   By: Hulan Saas M.D.   On: 04/21/2015 10:56    ASSESSMENT AND PLAN:  Jack Jenkins is a 81 y.o. male has a past medical history significant for COPD/asthma who was previously on home O2 follwed by Pulmonology now with 1-2 week hx of worsening dyspnea and fatigue. Some cough. No fever or CP. In ER, pt was hypoxic with sat=70% on RA. CT chest shows multiple small pulmonary nodules worrisome for adenocarcinoma or possible MAI.  1. Acute hypoxic respiratory failure in  the setting of history of pulmonary nodules and history of COPD/asthma -Try being off BiPAP -Appreciate pulmonary  help -Improvement and ABG -Continue IV Solu-Medrol daily, empiric antibiotic with Rocephin and Zithromax  -Nebs as needed -Try being off oxygen if possible  2. Bilateral multiple pulmonary nodules, chronic -Seen by Dr. Welton Flakes -To be followed closely by serial CTs as outpatient. Patient is a nonsmoker  3. CKD-III, acute on chronic renal failure -Improving creatinine  4. GERD continue Zantac  5. Hypertension continue Vasotec  PT and CSW to see pt  Case discussed with Care Management/Social Worker. Management plans discussed with the patient, family and they are in agreement.  CODE STATUS: full  DVT Prophylaxis: lovenox  TOTALcritical TIME TAKING CARE OF THIS PATIENT:68minutes.  >50% time spent on counselling and coordination of care  POSSIBLE D/C IN 1-2DAYS, DEPENDING ON CLINICAL CONDITION.   Nekoda Chock M.D on 04/22/2015 at 7:19 AM  Between 7am to 6pm - Pager - [phone]  After 6pm go to [email or web address] - password [email or web address] [facility]  [facility]  Office  [phone]  CC: Primary care physician; Megan Mans, MD

## 2015-04-22 NOTE — Care Management (Signed)
Spoke with patient and his wife.  Patient started experiencing shortness of breathing for at least one- two weeks prior to admission.  Upon arrival to ED his 02 sats were in the 3470s.  He required admission to icu due to need for bipap.  Currently patient has been weaned to nasal cannula.  He does not currently have 02 in the home.  He had it for about one month and the portable tanks "were way to heavy to manage."   If patient needs to discharge home on 02, he would like a very light weight portable.  He has back trouble and has to stoop to push a tank in a roller".  Previous company was [employer] and unless he could have a very extremely light weight portable, would like to speek with a different company.  Prior to this admission, he was driving and independent in all his adls

## 2015-04-22 NOTE — Consult Note (Signed)
[facility] Infectious Disease     Reason for Consult: Abnml CT chest, possible MAI    Referring Physician: Nicholes Mango Date of Admission:  04/21/2015   Principal Problem:   Acute respiratory failure ([place]) Active Problems:   Dyspnea   COPD exacerbation (HCC)   Abnormal chest CT   HPI: Jack Jenkins is a 81 y.o. male with hx COPD, prior pulm nodules following with Dr Humphrey Rolls admitted 04/21/15 with increased sob, cough for one week (slowly worsening over one month). He was found to be hypoxic and was admitted. No fevers, ns, chills, wt. Loss.   He denies tobacco (did have 2nd hand smoke), animal exposure or exposure to asbestos, textiles etc.  He reports getting the same illness similar to this yearly but  in between episodes is relatively asymptomatic.   History reviewed. No pertinent past medical history. Past Surgical History  Procedure Laterality Date  . Cataract extraction Bilateral   . Cholecystectomy    . Back surgery    . Hernia repair    . Polypectomy      colon  . Laminectomy    . Upper gi endoscopy  01/15/04   Social History  Substance Use Topics  . Smoking status: Never Smoker   . Smokeless tobacco: Never Used  . Alcohol Use: No   Family History  Problem Relation Age of Onset  . Heart failure Mother   . Heart attack Father   . CAD Sister   . Lung cancer Brother   . CAD Sister     Allergies: No Known Allergies  Current antibiotics: Antibiotics Given (last 72 hours)    None      MEDICATIONS: . antiseptic oral rinse  7 mL Mouth Rinse q12n4p  . aspirin EC  81 mg Oral Daily  . azithromycin  500 mg Intravenous Q24H  . budesonide (PULMICORT) nebulizer solution  0.5 mg Nebulization BID  . cefTRIAXone (ROCEPHIN)  IV  1 g Intravenous Q24H  . chlorhexidine  15 mL Mouth Rinse BID  . docusate sodium  100 mg Oral BID  . enalapril  2.5 mg Oral Daily  . famotidine  20 mg Oral BID  . heparin  5,000 Units Subcutaneous 3 times per day  . insulin aspart  0-9 Units  Subcutaneous TID WC  . ipratropium-albuterol  3 mL Nebulization QID  . [START ON 04/23/2015] methylPREDNISolone (SOLU-MEDROL) injection  60 mg Intravenous Q24H  . sodium chloride  3 mL Intravenous Q12H    Review of Systems - 11 systems reviewed and negative per HPI   OBJECTIVE: Temp:  [97.4 F (36.3 C)-98.4 F (36.9 C)] 98.1 F (36.7 C) (12/12 1100) Pulse Rate:  [47-105] 98 (12/12 1339) Resp:  [13-32] 21 (12/12 1200) BP: (84-140)/(45-99) 109/66 mmHg (12/12 1339) SpO2:  [90 %-98 %] 95 % (12/12 1339) FiO2 (%):  [30 %] 30 % (12/12 0745) Weight:  [93.5 kg (206 lb 2.1 oz)-96.1 kg (211 lb 13.8 oz)] 93.5 kg (206 lb 2.1 oz) (12/12 0600) Physical Exam  Constitutional: He is oriented to person, place, and time. Obese HENT: perrla eomi Mouth/Throat: Oropharynx is clear and dry. No oropharyngeal exudate.  Cardiovascular: Normal rate, regular rhythm and normal heart sounds.  Pulmonary/Chest: poor air movement, rhonchi bil Abdominal: Soft. Bowel sounds are normal. He exhibits no distension. There is no tenderness.  Lymphadenopathy:  He has no cervical adenopathy.  Neurological: He is alert and oriented to person, place, and time.  Skin: Skin is warm and dry. No rash noted.  No erythema.  Psychiatric: He has a normal mood and affect. His behavior is normal.  Ext no cce  LABS: Results for orders placed or performed during the hospital encounter of 04/21/15 (from the past 48 hour(s))  Troponin I     Status: None   Collection Time: 04/21/15  8:09 AM  Result Value Ref Range   Troponin I 0.03 <0.031 ng/mL    Comment:        NO INDICATION OF MYOCARDIAL INJURY.   Brain natriuretic peptide     Status: Abnormal   Collection Time: 04/21/15  8:09 AM  Result Value Ref Range   B Natriuretic Peptide 113.0 (H) 0.0 - 100.0 pg/mL  CBC with Differential     Status: Abnormal   Collection Time: 04/21/15  8:09 AM  Result Value Ref Range   WBC 12.0 (H) 3.8 - 10.6 K/uL   RBC 5.05 4.40 - 5.90 MIL/uL    Hemoglobin 15.2 13.0 - 18.0 g/dL   HCT 47.9 40.0 - 52.0 %   MCV 94.9 80.0 - 100.0 fL   MCH 30.0 26.0 - 34.0 pg   MCHC 31.6 (L) 32.0 - 36.0 g/dL   RDW 14.0 11.5 - 14.5 %   Platelets 214 150 - 440 K/uL   Neutrophils Relative % 78 %   Neutro Abs 9.3 (H) 1.4 - 6.5 K/uL   Lymphocytes Relative 10 %   Lymphs Abs 1.2 1.0 - 3.6 K/uL   Monocytes Relative 11 %   Monocytes Absolute 1.3 (H) 0.2 - 1.0 K/uL   Eosinophils Relative 0 %   Eosinophils Absolute 0.0 0 - 0.7 K/uL   Basophils Relative 1 %   Basophils Absolute 0.1 0 - 0.1 K/uL  Basic metabolic panel     Status: Abnormal   Collection Time: 04/21/15  8:09 AM  Result Value Ref Range   Sodium 145 135 - 145 mmol/L   Potassium 4.6 3.5 - 5.1 mmol/L   Chloride 107 101 - 111 mmol/L   CO2 33 (H) 22 - 32 mmol/L   Glucose, Bld 171 (H) 65 - 99 mg/dL   BUN 22 (H) 6 - 20 mg/dL   Creatinine, Ser 1.76 (H) 0.61 - 1.24 mg/dL   Calcium 8.8 (L) 8.9 - 10.3 mg/dL   GFR calc non Af Amer 34 (L) >60 mL/min   GFR calc Af Amer 40 (L) >60 mL/min    Comment: (NOTE) The eGFR has been calculated using the CKD EPI equation. This calculation has not been validated in all clinical situations. eGFR's persistently <60 mL/min signify possible Chronic Kidney Disease.    Anion gap 5 5 - 15  Hemoglobin A1c     Status: None   Collection Time: 04/21/15  8:09 AM  Result Value Ref Range   Hgb A1c MFr Bld 5.1 4.0 - 6.0 %  Blood gas, arterial     Status: Abnormal   Collection Time: 04/21/15 12:30 PM  Result Value Ref Range   FIO2 0.36    Delivery systems NO CHARGE    pH, Arterial 7.24 (L) 7.350 - 7.450   pCO2 arterial 93 (HH) 32.0 - 48.0 mmHg    Comment: CRITICAL RESULT CALLED TO, READ BACK BY AND VERIFIED WITH: JEFFREY SPARKS, MD AT 1250 ON [id number]    pO2, Arterial 106 83.0 - 108.0 mmHg   Bicarbonate 39.9 (H) 21.0 - 28.0 mEq/L   Acid-Base Excess 8.2 (H) 0.0 - 3.0 mmol/L   O2 Saturation 97.1 %   Patient temperature 37.0  Collection site RIGHT RADIAL    Sample  type ARTERIAL DRAW    Allens test (pass/fail) POSITIVE (A) PASS  Glucose, capillary     Status: Abnormal   Collection Time: 04/21/15  3:24 PM  Result Value Ref Range   Glucose-Capillary 221 (H) 65 - 99 mg/dL  MRSA PCR Screening     Status: None   Collection Time: 04/21/15  3:25 PM  Result Value Ref Range   MRSA by PCR NEGATIVE NEGATIVE    Comment:        The GeneXpert MRSA Assay (FDA approved for NASAL specimens only), is one component of a comprehensive MRSA colonization surveillance program. It is not intended to diagnose MRSA infection nor to guide or monitor treatment for MRSA infections.   Blood gas, arterial     Status: Abnormal   Collection Time: 04/21/15  5:20 PM  Result Value Ref Range   FIO2 0.30    Delivery systems BILEVEL POSITIVE AIRWAY PRESSURE    Inspiratory PAP 14    Expiratory PAP 6    pH, Arterial 7.26 (L) 7.350 - 7.450   pCO2 arterial 77 (HH) 32.0 - 48.0 mmHg    Comment: CRITICAL RESULT CALLED TO, READ BACK BY AND VERIFIED WITH: CRITICAL VALUE CALLED TO CHERYL, RN AT 1829 ON [id number] BY GKM    pO2, Arterial 79 (L) 83.0 - 108.0 mmHg   Bicarbonate 34.6 (H) 21.0 - 28.0 mEq/L   Acid-Base Excess 4.9 (H) 0.0 - 3.0 mmol/L   O2 Saturation 93.5 %   Patient temperature 37.0    Collection site RIGHT RADIAL    Sample type ARTERIAL DRAW    Allens test (pass/fail) POSITIVE (A) PASS   Mechanical Rate 12   Glucose, capillary     Status: Abnormal   Collection Time: 04/21/15  9:48 PM  Result Value Ref Range   Glucose-Capillary 139 (H) 65 - 99 mg/dL  CBC     Status: None   Collection Time: 04/22/15  3:04 AM  Result Value Ref Range   WBC 10.4 3.8 - 10.6 K/uL   RBC 4.44 4.40 - 5.90 MIL/uL   Hemoglobin 13.8 13.0 - 18.0 g/dL   HCT 42.5 40.0 - 52.0 %   MCV 95.7 80.0 - 100.0 fL   MCH 31.0 26.0 - 34.0 pg   MCHC 32.4 32.0 - 36.0 g/dL   RDW 14.2 11.5 - 14.5 %   Platelets 181 150 - 440 K/uL  Comprehensive metabolic panel     Status: Abnormal   Collection Time:  04/22/15  3:04 AM  Result Value Ref Range   Sodium 142 135 - 145 mmol/L   Potassium 4.9 3.5 - 5.1 mmol/L   Chloride 106 101 - 111 mmol/L   CO2 30 22 - 32 mmol/L   Glucose, Bld 157 (H) 65 - 99 mg/dL   BUN 25 (H) 6 - 20 mg/dL   Creatinine, Ser 1.49 (H) 0.61 - 1.24 mg/dL   Calcium 8.4 (L) 8.9 - 10.3 mg/dL   Total Protein 6.2 (L) 6.5 - 8.1 g/dL   Albumin 2.6 (L) 3.5 - 5.0 g/dL   AST 20 15 - 41 U/L   ALT 24 17 - 63 U/L   Alkaline Phosphatase 70 38 - 126 U/L   Total Bilirubin 0.9 0.3 - 1.2 mg/dL   GFR calc non Af Amer 42 (L) >60 mL/min   GFR calc Af Amer 49 (L) >60 mL/min    Comment: (NOTE) The eGFR has been calculated using the CKD  EPI equation. This calculation has not been validated in all clinical situations. eGFR's persistently <60 mL/min signify possible Chronic Kidney Disease.    Anion gap 6 5 - 15  Glucose, capillary     Status: Abnormal   Collection Time: 04/22/15  7:32 AM  Result Value Ref Range   Glucose-Capillary 131 (H) 65 - 99 mg/dL  Glucose, capillary     Status: Abnormal   Collection Time: 04/22/15 11:19 AM  Result Value Ref Range   Glucose-Capillary 136 (H) 65 - 99 mg/dL   No components found for: ESR, C REACTIVE PROTEIN MICRO: Recent Results (from the past 720 hour(s))  MRSA PCR Screening     Status: None   Collection Time: 04/21/15  3:25 PM  Result Value Ref Range Status   MRSA by PCR NEGATIVE NEGATIVE Final    Comment:        The GeneXpert MRSA Assay (FDA approved for NASAL specimens only), is one component of a comprehensive MRSA colonization surveillance program. It is not intended to diagnose MRSA infection nor to guide or monitor treatment for MRSA infections.     IMAGING: Dg Chest 2 View  04/21/2015  CLINICAL DATA:  Shortness of breath with cough EXAM: CHEST  2 VIEW COMPARISON:  November 19, 2014 FINDINGS: There is stable elevation of the left hemidiaphragm. There is a stable calcified granuloma in the right base. There is slight bibasilar  atelectasis. There is stable slight scarring in the left upper lobe. Lungs elsewhere are clear. Heart size and pulmonary vascular normal. No adenopathy. Bones are osteoporotic. IMPRESSION: Slight bibasilar atelectatic change. No edema or consolidation. Stable granuloma right base. Stable scarring left upper lobe near the apex. Stable elevation left hemidiaphragm. Cardiac silhouette within normal limits. Electronically Signed   By: Lowella Grip III M.D.   On: 04/21/2015 08:47   Ct Angio Chest Pe W/cm &/or Wo Cm  04/21/2015  CLINICAL DATA:  81 year old with acute onset of shortness of breath and tachycardia while at home, hypoxic upon arrival in the emergency department. Current history of asthma, CHF and chronic kidney disease. EXAM: CT ANGIOGRAPHY CHEST WITH CONTRAST TECHNIQUE: Multidetector CT imaging of the chest was performed using the standard protocol during bolus administration of intravenous contrast. Multiplanar CT image reconstructions and MIPs were obtained to evaluate the vascular anatomy. CONTRAST:  67m OMNIPAQUE IOHEXOL 350 MG/ML IV. COMPARISON:  05/15/2013, 04/13/2011. FINDINGS: Technical quality: Good. Respiratory motion blurred several of the images at the lung bases. Pulmonary embolism:  Absent. Cardiovascular: Heart mildly enlarged with left ventricular predominance. Mild LAD and circumflex coronary artery atherosclerosis. No pericardial effusion. Mild atherosclerosis involving the thoracic aorta without evidence of aneurysm or dissection. Proximal great vessels patent though atherosclerotic. Direct origin of left vertebral artery from the aortic arch. Mediastinum/Lymph Nodes: Numerous normal-sized mediastinal, hilar and axillary lymph nodes. Some of the normal hilar and mediastinal nodes contain calcification. No pathologic lymphadenopathy. Small of fluid in the mid esophagus. No morphologic abnormality involving the esophagus. No hiatal hernia. Lungs/Pleura: Subsolid nodule in the right  upper lobe measuring maximally 2.9 cm (coronal image 94). Subsolid nodule more inferiorly in the central right upper lobe (series 6, image 38) measuring maximally 3.3 cm. Ground-glass opacity peripherally in the left upper lobe, adjacent to the fissure, measuring maximally 3.5 cm. Numerous areas of peribronchovascular nodularity in the right upper lobe, right middle lobe, and to a lesser degree the left upper lobe and both lower lobes. Densely calcified granuloma deep in the right lower lobe. Small bilateral  pleural effusions, right greater than left, with minimal passive atelectasis in the lower lobes. Chronic elevation left hemidiaphragm with chronic scar/atelectasis in the lingula and left lower lobe. Bronchiectasis involving the right lower lobe. Other: Mild to moderate bilateral gynecomastia, left greater than right. Upper abdomen: No acute abnormalities involving the visualized upper abdomen. Musculoskeletal: Degenerative disc disease and spondylosis diffusely throughout the thoracic spine, mild in degree. Review of the MIP images confirms the above findings. IMPRESSION: 1. No evidence of pulmonary embolism. 2. Multiple lung findings as detailed above including subsolid nodules in the right upper lobe, ground-glass opacity peripherally in the left upper lobe, and numerous areas of peribronchovascular nodularity in both lungs. While all of the findings may be related to chronic mycobacterium avium intracellulare infection, the subsolid right upper lobe nodules have an appearance that can be seen with adenocarcinoma. See below for followup recommendation. 3. Small bilateral pleural effusions, right greater than left, with minimal passive atelectasis in the lower lobes. 4. Mild to moderate bilateral gynecomastia. Initial follow-up by chest CT without contrast is recommended in 3 months to confirm persistence of the subsolid nodules. This recommendation follows the consensus statement: Recommendations for the  Management of Subsolid Pulmonary Nodules Detected at CT: A Statement from the [place] as published in Radiology 2013; 266:304-317. Electronically Signed   By: Evangeline Dakin M.D.   On: 04/21/2015 10:56    Assessment:   Jack Jenkins is a 81 y.o. male with COPD, admitted with hypoxia, sob, cough.  He has hx COPD and pulm nodules following with Dr Humphrey Rolls. No occupational or TB exposures. No fevers, ns, wt loss.  CT done this admit with several pulm nodules, ground glass opacities and peribronchovascular nodularity raising concern for atypical infection including MAI.  He has never had a bronchoscopy.  Recommendations Rec continue treatment for COPD and PNA as you are.  Check routine sputum cx.  I can see in fu as otpt in 3-5 weeks if his cough persists after appropriate treatment. Would then work up for MAI infection but this is a chronic indolent infection and is unlikely related to his acute decline.  Thank you very much for allowing me to participate in the care of this patient. Please call with questions.   Cheral Marker. Ola Spurr, MD

## 2015-04-22 NOTE — Consult Note (Signed)
Pulmonary Critical Care  Initial Consult Note   Starleen BlueGrady A Willard ZOX:[id number] DOB: [birth year] DOA: 04/21/2015  Referring physician: Aram BeechamJeffrey Sparks, MD PCP: Megan Mansichard Gilbert Jr, MD   Chief Complaint: Shortness of Breath  HPI: alert and awake Follows commands, remains on biPAP, will attempt to wean off biPAP today    Physical Exam: Filed Vitals:   04/22/15 0727 04/22/15 0745 04/22/15 0800 04/22/15 1053  BP:   111/80   Pulse:  95 94   Temp: 97.4 F (36.3 C)     TempSrc: Axillary     Resp:  30 32   Height:      Weight:      SpO2:  96% 96% 94%    Wt Readings from Last 3 Encounters:  04/22/15 206 lb 2.1 oz (93.5 kg)  12/31/14 211 lb (95.709 kg)  12/17/14 214 lb 6.4 oz (97.251 kg)    General:  Appears calm and comfortable with BIPAP on facec Cardiovascular: RRR, no m/r/g. No LE edema. Respiratory: diminished BS bilaterally. Normal respiratory effort. Abdomen: soft, nontender Neurologic: grossly non-focal.          Labs on Admission:  Basic Metabolic Panel:  Recent Labs Lab 04/21/15 0809 04/22/15 0304  NA 145 142  K 4.6 4.9  CL 107 106  CO2 33* 30  GLUCOSE 171* 157*  BUN 22* 25*  CREATININE 1.76* 1.49*  CALCIUM 8.8* 8.4*   Liver Function Tests:  Recent Labs Lab 04/22/15 0304  AST 20  ALT 24  ALKPHOS 70  BILITOT 0.9  PROT 6.2*  ALBUMIN 2.6*   No results for input(s): LIPASE, AMYLASE in the last 168 hours. No results for input(s): AMMONIA in the last 168 hours. CBC:  Recent Labs Lab 04/21/15 0809 04/22/15 0304  WBC 12.0* 10.4  NEUTROABS 9.3*  --   HGB 15.2 13.8  HCT 47.9 42.5  MCV 94.9 95.7  PLT 214 181   Cardiac Enzymes:  Recent Labs Lab 04/21/15 0809  TROPONINI 0.03    BNP (last 3 results)  Recent Labs  04/21/15 0809  BNP 113.0*    ProBNP (last 3 results)  Recent Labs  12/31/14 1204  PROBNP 94    CBG:  Recent Labs Lab 04/21/15 1524 04/21/15 2148 04/22/15 0732  GLUCAP 221* 139* 131*       EKG: Independently  reviewed.  Assessment/Plan Principal Problem:   Acute respiratory failure (HCC) Active Problems:   Dyspnea   COPD exacerbation ([facility])   Abnormal chest CT   81 yo white male male with acute hypercapnic resp failure from acute COPD exacerbation from pneumonia  1. Acute respiratory failure with hypoxia and hypercapnia -Patient appears to be doing better -wean off biPAP and re-assess resp status  2. COPD with exacerbation -continue  IV steroids  -continue with nebs -empiric antibiotics  3. Pulmonary Nodules -these have been noted since back in 2012 -there is some concern for one of the nodules and this can be followed as an outpatient once he is discharged  4. CKD IV -monitor renal function which appears to be around baseline  The Patient requires high complexity decision making for assessment and support, frequent evaluation and titration of therapies.  Lucie LeatherKurian David Kasa, M.D.  [facility]  Medical Director [facility] Medical Director [facility]

## 2015-04-22 NOTE — Progress Notes (Signed)
Pharmacy Antibiotic Follow-up Note  Starleen BlueGrady A Yellen is a 81 y.o. year-old male admitted on 04/21/2015.  The patient is currently on day 2 of ceftriaxone and azithromycin for AECOPD.  Assessment/Plan: After discussion with Dr. Belia HemanKasa, will place a stop date for ceftriaxone at 7 days of therapy and for azithromycin at 5 days of therapy.   Temp (24hrs), Avg:98.1 F (36.7 C), Min:97.4 F (36.3 C), Max:98.4 F (36.9 C)   Recent Labs Lab 04/21/15 0809 04/22/15 0304  WBC 12.0* 10.4    Recent Labs Lab 04/21/15 0809 04/22/15 0304  CREATININE 1.76* 1.49*   Estimated Creatinine Clearance: 43.1 mL/min (by C-G formula based on Cr of 1.49).    No Known Allergies  Antimicrobials this admission: Azithromycin 12/11 >> Ceftriaxone 12/11 >>   Microbiology results: 12/11 MRSA PCR: negative  Thank you for allowing pharmacy to be a part of this patient's care.  Luisa Harthristy, Hodan Wurtz D PharmD 04/22/2015 2:25 PM

## 2015-04-23 LAB — GLUCOSE, CAPILLARY
Glucose-Capillary: 146 mg/dL — ABNORMAL HIGH (ref 65–99)
Glucose-Capillary: 109 mg/dL — ABNORMAL HIGH (ref 65–99)

## 2015-04-23 MED ORDER — CEFUROXIME AXETIL 500 MG PO TABS
500.0000 mg | ORAL_TABLET | Freq: Two times a day (BID) | ORAL | Status: DC
  Administered 2015-04-23 – 2015-04-24 (×2): 500 mg via ORAL
  Filled 2015-04-23 (×4): qty 1

## 2015-04-23 MED ORDER — MOMETASONE FURO-FORMOTEROL FUM 100-5 MCG/ACT IN AERO
2.0000 | INHALATION_SPRAY | Freq: Two times a day (BID) | RESPIRATORY_TRACT | Status: DC
  Administered 2015-04-23 – 2015-04-24 (×3): 2 via RESPIRATORY_TRACT
  Filled 2015-04-23: qty 8.8

## 2015-04-23 MED ORDER — AZITHROMYCIN 250 MG PO TABS
500.0000 mg | ORAL_TABLET | Freq: Every day | ORAL | Status: DC
  Administered 2015-04-23 – 2015-04-24 (×2): 500 mg via ORAL
  Filled 2015-04-23 (×2): qty 2

## 2015-04-23 MED ORDER — ENOXAPARIN SODIUM 40 MG/0.4ML ~~LOC~~ SOLN
40.0000 mg | SUBCUTANEOUS | Status: DC
Start: 2015-04-23 — End: 2015-04-24
  Administered 2015-04-23: 40 mg via SUBCUTANEOUS
  Filled 2015-04-23: qty 0.4

## 2015-04-23 MED ORDER — TIOTROPIUM BROMIDE MONOHYDRATE 18 MCG IN CAPS
18.0000 ug | ORAL_CAPSULE | Freq: Every day | RESPIRATORY_TRACT | Status: DC
  Administered 2015-04-23 – 2015-04-24 (×2): 18 ug via RESPIRATORY_TRACT
  Filled 2015-04-23: qty 5

## 2015-04-23 MED ORDER — ALBUTEROL SULFATE (2.5 MG/3ML) 0.083% IN NEBU
INHALATION_SOLUTION | RESPIRATORY_TRACT | Status: AC
  Administered 2015-04-23: 2.5 mg
  Filled 2015-04-23: qty 3

## 2015-04-23 MED ORDER — ALBUTEROL SULFATE (2.5 MG/3ML) 0.083% IN NEBU
2.5000 mg | INHALATION_SOLUTION | RESPIRATORY_TRACT | Status: DC
  Administered 2015-04-23 – 2015-04-24 (×5): 2.5 mg via RESPIRATORY_TRACT
  Filled 2015-04-23 (×5): qty 3

## 2015-04-23 MED ORDER — FAMOTIDINE 20 MG PO TABS
20.0000 mg | ORAL_TABLET | Freq: Every day | ORAL | Status: DC
  Administered 2015-04-24: 09:00:00 20 mg via ORAL
  Filled 2015-04-23: qty 1

## 2015-04-23 MED ORDER — PREDNISONE 50 MG PO TABS
50.0000 mg | ORAL_TABLET | Freq: Every day | ORAL | Status: DC
  Administered 2015-04-24: 09:00:00 50 mg via ORAL
  Filled 2015-04-23: qty 1

## 2015-04-23 NOTE — Progress Notes (Signed)
PHARMACIST - PHYSICIAN COMMUNICATION DR:   Allena KatzPatel CONCERNING: Antibiotic IV to Oral Route Change Policy  RECOMMENDATION: This patient is receiving azithromycin by the intravenous route.  Based on criteria approved by the Pharmacy and Therapeutics Committee, the antibiotic(s) is/are being converted to the equivalent oral dose form(s).   DESCRIPTION: These criteria include:  Patient being treated for a respiratory tract infection, urinary tract infection, cellulitis or clostridium difficile associated diarrhea if on metronidazole  The patient is not neutropenic and does not exhibit a GI malabsorption state  The patient is eating (either orally or via tube) and/or has been taking other orally administered medications for a least 24 hours  The patient is improving clinically and has a Tmax < 100.5  If you have questions about this conversion, please contact the Pharmacy Department  []   [phone]( [phone] )  [facility] [x]   [phone]( [phone] )  [facility] []   [phone]( [phone] )  [facility] []   [phone]( [phone] )  [facility] []   [phone]( [phone] )  [facility]   Luisa HartScott Khya Halls, PharmD

## 2015-04-23 NOTE — Plan of Care (Signed)
Problem: Education: Goal: Knowledge of  General Education information/materials will improve Outcome: Progressing Oriented to room when arrived to floor.  Opportunity to ask questions.   Problem: Safety: Goal: Ability to remain free from injury will improve Outcome: Progressing No injury.  Instructed regarding calling nurse or assistant  If needing to be oob.  Problem: Pain Managment: Goal: General experience of comfort will improve Outcome: Progressing No c/o pain  Problem: Skin Integrity: Goal: Risk for impaired skin integrity will decrease Outcome: Progressing Eccy.  No ulcers noted.

## 2015-04-23 NOTE — Progress Notes (Signed)
Pt remains hypotensive at this time.  He is alert and oriented x 4.  Denies CP or SOB.  He is laying on his left side for sleeping.

## 2015-04-23 NOTE — Progress Notes (Signed)
Jack Jenkins at [facility]   PATIENT NAME: Jack Jenkins    MR#:  [id number]  DATE OF BIRTH:  Mar 01, 1931  SUBJECTIVE:  Came in with upper respiratory symptoms for last several days and increasing shortness of breath found to have sats down in the 70s. Currently on 2 liter [place] sats 96-97%. Denies any chest pain or shortness of breath at present.  REVIEW OF SYSTEMS:   Review of Systems  Constitutional: Negative for fever, chills and weight loss.  HENT: Positive for congestion. Negative for ear discharge, ear pain and nosebleeds.   Eyes: Negative for blurred vision, pain and discharge.  Respiratory: Positive for cough and shortness of breath. Negative for sputum production, wheezing and stridor.   Cardiovascular: Negative for chest pain, palpitations, orthopnea and PND.  Gastrointestinal: Negative for nausea, vomiting, abdominal pain and diarrhea.  Genitourinary: Negative for urgency and frequency.  Musculoskeletal: Negative for back pain and joint pain.  Neurological: Positive for weakness. Negative for sensory change, speech change and focal weakness.  Psychiatric/Behavioral: Negative for depression and hallucinations. The patient is not nervous/anxious.   All other systems reviewed and are negative.  Tolerating Diet: Yes Tolerating PT: Pending  DRUG ALLERGIES:  No Known Allergies  VITALS:  Blood pressure 107/67, pulse 95, temperature 98.5 F (36.9 C), temperature source Oral, resp. rate 24, height  (1.753 m), weight 210 lb 5.1 oz (95.4 kg), SpO2 94 %.  PHYSICAL EXAMINATION:   Physical Exam  GENERAL:  81 y.o.-year-old patient lying in the bed with no acute distress.  EYES: Pupils equal, round, reactive to light and accommodation. No scleral icterus. Extraocular muscles intact.  HEENT: Head atraumatic, normocephalic. Oropharynx and nasopharynx clear.  BiPAP present NECK:  Supple, no jugular venous distention. No thyroid enlargement, no  tenderness.  LUNGS: Distant breath sounds bilaterally, no wheezing, rales, rhonchi. No use of accessory muscles of respiration.  CARDIOVASCULAR: S1, S2 normal. No murmurs, rubs, or gallops.  ABDOMEN: Soft, nontender, nondistended. Bowel sounds present. No organomegaly or mass.  EXTREMITIES: No cyanosis, clubbing or edema b/l.    NEUROLOGIC: Cranial nerves II through XII are intact. No focal Motor or sensory deficits b/l.   PSYCHIATRIC: patient is alert and oriented x 3.  SKIN: No obvious rash, lesion, or ulcer.    LABORATORY PANEL:   CBC  Recent Labs Lab 04/22/15 0304  WBC 10.4  HGB 13.8  HCT 42.5  PLT 181    Chemistries   Recent Labs Lab 04/22/15 0304  NA 142  K 4.9  CL 106  CO2 30  GLUCOSE 157*  BUN 25*  CREATININE 1.49*  CALCIUM 8.4*  AST 20  ALT 24  ALKPHOS 70  BILITOT 0.9    Cardiac Enzymes  Recent Labs Lab 04/21/15 0809  TROPONINI 0.03    RADIOLOGY:  No results found.  ASSESSMENT AND PLAN:  Jack Jenkins is a 81 y.o. male has a past medical history significant for COPD/asthma who was previously on home O2 follwed by Pulmonology now with 1-2 week hx of worsening dyspnea and fatigue. Some cough. No fever or CP. In ER, pt was hypoxic with sat=70% on RA. CT chest shows multiple small pulmonary nodules worrisome for adenocarcinoma or possible MAI.  1. Acute hypoxic respiratory failure in the setting of history of pulmonary nodules and history of COPD/asthma weaned off BiPAP -Appreciate pulmonary help -Improvement and ABG -Continue IV Solu-Medrol daily, empiric antibiotic with Rocephin and Zithromax--->change to po abxs and steroid taper  -Nebs  as needed -wean to RA if possible. Assess oxygen for home needs  2. Bilateral multiple pulmonary nodules, chronic -Seen by Dr. Welton FlakesKhan -To be followed closely by serial CTs as outpatient. Patient is a nonsmoker -seen by ID. Recommend out pt f/u  3. CKD-III, acute on chronic renal failure -Improving  creatinine  4. GERD continue Zantac  5. Hypertension continue Vasotec  PT and CSW to see pt Transfer to regular floor.  Case discussed with Care Management/Social Worker. Management plans discussed with the patient, family and they are in agreement.  CODE STATUS: full  DVT Prophylaxis: lovenox  TOTALcritical TIME TAKING CARE OF THIS PATIENT:8335minutes.  >50% time spent on counselling and coordination of care with pt and wife  POSSIBLE D/C IN 1-2DAYS, DEPENDING ON CLINICAL CONDITION.   Jack Jenkins M.D on 04/23/2015 at 11:51 AM  Between 7am to 6pm - Pager - [phone]  After 6pm go to [email or web address] - password [email or web address] [email or web address]  [facility]  Office  [phone]  CC: Primary care physician; Jack Mansichard Gilbert Jr, MD

## 2015-04-23 NOTE — Progress Notes (Signed)
Physical Therapy Treatment Patient Details Name: Jack Jenkins A Emami MRN: [id number] DOB: 10/27/1930 Today's Date: 04/23/2015    History of Present Illness presented to ER secondary to progressively worsening SOB, dyspnea; admitted with acute respiratory failure (noted with multiple pulmonary nodules, suspicious for adenocarcinoma vs. possible MAI) requiring BiPAP.    PT Comments    Patient weaned from BiPAP, now on 2.5L supplemental O2.  Able to initiate OOB activity, tolerating gait >100' with RW, cga.  Good stability and safety; fair cardiopulmonary endurance.  Pleased with progress and ability for OOB activities; goals updated to reflect progress. Continues to require supplemental O2 to maintain appropriate sats with exertion.  RN informed/aware.  SaO2 on room air at rest = 90% SaO2 on room air while ambulating = 86% SaO2 on 3 liters of O2 while ambulating = 88%     Follow Up Recommendations  Home health PT     Equipment Recommendations  Rolling walker with 5" wheels    Recommendations for Other Services       Precautions / Restrictions Precautions Precautions: Fall Restrictions Weight Bearing Restrictions: No    Mobility  Bed Mobility Overal bed mobility: Modified Independent                Transfers Overall transfer level: Needs assistance Equipment used: Rolling walker (2 wheeled) Transfers: Sit to/from Stand Sit to Stand: Min guard            Ambulation/Gait Ambulation/Gait assistance: Min guard Ambulation Distance (Feet): 120 Feet Assistive device: Rolling walker (2 wheeled)       General Gait Details: reciprocal stepping pattern with slow, but steady, cadence and overall gait performance.  No buckling or LOB; woul dbenefit from continued education regarding activity pacing, signs/symptoms of fatigue and energy conservation.   Stairs            Wheelchair Mobility    Modified Rankin (Stroke Patients Only)       Balance Overall  balance assessment: Needs assistance Sitting-balance support: No upper extremity supported;Feet supported Sitting balance-Leahy Scale: Good     Standing balance support: Bilateral upper extremity supported Standing balance-Leahy Scale: Fair                      Cognition Arousal/Alertness: Awake/alert Behavior During Therapy: WFL for tasks assessed/performed Overall Cognitive Status: Within Functional Limits for tasks assessed                      Exercises      General Comments        Pertinent Vitals/Pain Pain Assessment: No/denies pain    Home Living                      Prior Function            PT Goals (current goals can now be found in the care plan section) Acute Rehab PT Goals Patient Stated Goal: to breathe easier PT Goal Formulation: With patient Time For Goal Achievement: 05/06/15 Potential to Achieve Goals: Good Progress towards PT goals: Progressing toward goals    Frequency  Min 2X/week    PT Plan Current plan remains appropriate    Co-evaluation             End of Session Equipment Utilized During Treatment: Gait belt Activity Tolerance: Patient tolerated treatment well Patient left: in chair;with call bell/phone within reach;with chair alarm set     Time: [id number] PT Time Calculation (  min) (ACUTE ONLY): 26 min  Charges:  $Gait Training: 8-22 mins $Therapeutic Activity: 8-22 mins                    G Codes:      Shawnae Leiva H. Manson Passey, PT, DPT, NCS 04/23/2015, 11:51 AM [phone]

## 2015-04-24 LAB — GLUCOSE, CAPILLARY
GLUCOSE-CAPILLARY: 87 mg/dL (ref 65–99)
GLUCOSE-CAPILLARY: 120 mg/dL — AB (ref 65–99)

## 2015-04-24 MED ORDER — TIOTROPIUM BROMIDE MONOHYDRATE 18 MCG IN CAPS
18.0000 ug | ORAL_CAPSULE | Freq: Every day | RESPIRATORY_TRACT | Status: DC
Start: 2015-04-24 — End: 2017-06-14

## 2015-04-24 MED ORDER — ASPIRIN 81 MG PO TBEC: 81.0000 mg | DELAYED_RELEASE_TABLET | Freq: Every day | ORAL | Status: DC

## 2015-04-24 MED ORDER — PREDNISONE 50 MG PO TABS: ORAL_TABLET | ORAL | Status: DC

## 2015-04-24 MED ORDER — CEFUROXIME AXETIL 500 MG PO TABS: 500.0000 mg | ORAL_TABLET | Freq: Two times a day (BID) | ORAL | Status: DC

## 2015-04-24 MED ORDER — AZITHROMYCIN 250 MG PO TABS: ORAL_TABLET | ORAL | Status: DC

## 2015-04-24 MED ORDER — MOMETASONE FURO-FORMOTEROL FUM 100-5 MCG/ACT IN AERO: 2.0000 | INHALATION_SPRAY | Freq: Two times a day (BID) | RESPIRATORY_TRACT | Status: DC

## 2015-04-24 MED ORDER — ALBUTEROL SULFATE (2.5 MG/3ML) 0.083% IN NEBU: 2.5000 mg | INHALATION_SOLUTION | RESPIRATORY_TRACT | Status: DC | PRN

## 2015-04-24 NOTE — Progress Notes (Signed)
SATURATION QUALIFICATIONS: (This note is used to comply with regulatory documentation for home oxygen)  Patient Saturations on Room Air at Rest =87% Patient Saturations on Room Air while Ambulating =83%  Patient Saturations on 2 liters of oxygen while Ambulating = 94%  Please briefly explain why patient needs home oxygen:desats while ambulating on room air

## 2015-04-24 NOTE — Care Management (Signed)
Qualifies for home oxygen. [employer] provided Mr. Rodman Keyack's home oxygen in the past, would like [employer] for home oxygen again. Left voice mail with Will Dareen PianoAnderson, durable medical equipment representative for [employer] updated.  Declines Home Health and physical therapy at this time. Wife will transport.  Discharge to home today per Dr. Allena KatzPatel. Gwenette GreetBrenda S Lajean Boese RN MSN CCM Care Management [phone]

## 2015-04-24 NOTE — Plan of Care (Signed)
Problem: Skin Integrity: Goal: Risk for impaired skin integrity will decrease Outcome: Progressing No c/o pain nor distress noted during this shift. Rested thru out this shift. Continues on 2 1/2 L of O2. Nebs scheduled Q 4 hrs. Continue to monitor.

## 2015-04-24 NOTE — Care Management Important Message (Signed)
Important Message  Patient Details  Name: Starleen BlueGrady A Ziebarth MRN: [id number] Date of Birth: 04-Jul-1930   Medicare Important Message Given:  Yes    Olegario MessierKathy A Hari Casaus 04/24/2015, 10:28 AM

## 2015-04-24 NOTE — Progress Notes (Signed)
Pt for discharge home no resp distress. 02 in use at 2 l [place] pt to go home with 02. Instructions discussed with pt. Diet activity and f/u discussed . Verbalize understanding. Home via w/c at this time with wife

## 2015-04-24 NOTE — Discharge Instructions (Signed)
Acute Respiratory Distress Syndrome °Acute respiratory distress syndrome is a life-threatening condition in which fluid collects in the lungs. This condition can cause severe shortness of breath and low oxygen levels in the blood. It can also cause the lungs and other vital organs to fail. The condition usually develops within 24 to 48 hours of an infection, illness, surgery, or injury. °CAUSES °This condition may be caused by: °· An infection, such as sepsis or pneumonia. °· A serious injury to the head or chest. °· A major surgery. °· A drug overdose. °· Breathing in harmful chemicals or smoke. °· Blood transfusions. °· A blood clot in the lungs. °SYMPTOMS °Symptoms of this condition include: °· Fever. °· Difficulty breathing. °· Bluish skin (cyanosis). °· A fast or irregular heartbeat. °· Low blood pressure (hypotension). °· Agitation. °· Confusion. °· Lack of energy (lethargy). °· Sweating. °DIAGNOSIS °This condition may be diagnosed by using tests to rule out other diseases and conditions that cause similar symptoms. You may have: °· A chest X-ray or CT scan. °· Blood tests. °· A sputum culture. In this test, you will be asked to spit so that a sample of lung fluid can be taken for testing. °· Bronchoscopy. During this test a thin, flexible tool is passed into the mouth or nose, down the windpipe, and into the lungs. °TREATMENT °This condition may be treated with: °· Oxygen. A breathing machine (ventilator) is often used to provide oxygen and help with breathing. °· Medicine to help you relax (sedative). °· Fluids and nutrients given through an IV tube. °· Blood pressure medicine. °· Steroid medicine to help decrease inflammation in the lungs. °· Diuretic medicine to get rid of extra fluid in the body. °Additional treatment may be needed, depending on the cause of the condition. It can take up to 12 months to recover from this condition. Some people recover fully, but others may continue to  have: °· Weakness. °· Shortness of breath. °· Memory problems. °· Depression. °HOME CARE INSTRUCTIONS °Until you recover from this condition: °· Do not smoke. °· Limit alcohol intake to no more than 1 drink per day for nonpregnant women and 2 drinks per day for men. One drink equals 12 oz of beer, 5 oz of wine, or 1½ oz of hard liquor. °· Ask friends and family to help you if daily activities make you tired. °SEEK MEDICAL CARE IF: °· You become short of breath with activity or while resting. °· You develop a cough that does not go away. °· You have a fever. °SEEK IMMEDIATE MEDICAL CARE IF: °· You have sudden shortness of breath. °· You develop chest pain that does not go away. °· You develop swelling or pain in one of your legs. °· You cough up blood. °  °This information is not intended to replace advice given to you by your health care provider. Make sure you discuss any questions you have with your health care provider. °  °Document Released: 04/27/2005 Document Revised: 09/11/2014 Document Reviewed: 04/23/2014 °Elsevier Interactive Patient Education ©2016 Else[employer] Inc[employer] ° °

## 2015-04-24 NOTE — Discharge Summary (Signed)
[facility]   PATIENT NAME: Jack Jenkins    MR#:  [id number]  DATE OF BIRTH:  Sep 08, 1930  DATE OF ADMISSION:  04/21/2015 ADMITTING PHYSICIAN: Marguarite ArbourJeffrey D Sparks, MD  DATE OF DISCHARGE: 04/24/15  PRIMARY CARE PHYSICIAN: Megan Mansichard Gilbert Jr, MD    ADMISSION DIAGNOSIS:  Dyspnea [R06.00] Hypoxia [R09.02]  DISCHARGE DIAGNOSIS:  Acute hypoxic respiratory failure Bilateral known h/o Pulmonary nodules followed by Dr Welton FlakesKhan CKD-III SECONDARY DIAGNOSIS:  History reviewed. No pertinent past medical history.  HOSPITAL COURSE:   Jack Jenkins is a 81 y.o. male has a past medical history significant for COPD/asthma who was previously on home O2 follwed by Pulmonology now with 1-2 week hx of worsening dyspnea and fatigue. Some cough. No fever or CP. In ER, pt was hypoxic with sat=70% on RA. CT chest shows multiple small pulmonary nodules worrisome for adenocarcinoma or possible MAI.  1. Acute hypoxic respiratory failure in the setting of history of pulmonary nodules and history of COPD/asthma weaned off BiPAP -Appreciate pulmonary help -Improvement and ABG -change to po abxs and steroid taper  -Nebs as needed -wean to RA if possible. Assess oxygen for home needs  2. Bilateral multiple pulmonary nodules, chronic -Seen by Dr. Welton FlakesKhan -To be followed closely by serial CTs as outpatient. Patient is a nonsmoker -seen by ID. Recommend out pt f/u  3. CKD-III, acute on chronic renal failure -Improving creatinine  4. GERD continue Zantac  5. Hypertension bp on the lower side. D/c vasotec  HHPT recommended D/c home later today  CONSULTS OBTAINED:  Treatment Team:  Clydie Braunavid Fitzgerald, MD  DRUG ALLERGIES:  No Known Allergies  DISCHARGE MEDICATIONS:   Current Discharge Medication List    START taking these medications   Details  aspirin EC 81 MG EC tablet Take 1 tablet (81 mg total) by mouth daily. Qty: 30 tablet, Refills: 0    azithromycin  (ZITHROMAX) 250 MG tablet Take as directed Qty: 4 each, Refills: 0    cefUROXime (CEFTIN) 500 MG tablet Take 1 tablet (500 mg total) by mouth 2 (two) times daily with a meal. Qty: 8 tablet, Refills: 0    mometasone-formoterol (DULERA) 100-5 MCG/ACT AERO Inhale 2 puffs into the lungs 2 (two) times daily. Qty: 1 Inhaler, Refills: 1    predniSONE (DELTASONE) 50 MG tablet Start with 50 mg taper by 10 mg daily then stop Qty: 15 tablet, Refills: 0    tiotropium (SPIRIVA) 18 MCG inhalation capsule Place 1 capsule (18 mcg total) into inhaler and inhale daily. Qty: 30 capsule, Refills: 12      CONTINUE these medications which have NOT CHANGED   Details  allopurinol (ZYLOPRIM) 100 MG tablet Take 150 mg by mouth daily as needed (for gout.).     enalapril (VASOTEC) 2.5 MG tablet Take 1 tablet (2.5 mg total) by mouth daily. Qty: 90 tablet, Refills: 1   Associated Diagnoses: Chronic diastolic congestive heart failure (HCC)    ranitidine (ZANTAC) 150 MG tablet Take 1 tablet (150 mg total) by mouth 2 (two) times daily. Qty: 60 tablet, Refills: 5   Associated Diagnoses: Gastroesophageal reflux disease, esophagitis presence not specified        If you experience worsening of your admission symptoms, develop shortness of breath, life threatening emergency, suicidal or homicidal thoughts you must seek medical attention immediately by calling 911 or calling your MD immediately  if symptoms less severe.  You Must read complete instructions/literature along with all the possible adverse  reactions/side effects for all the Medicines you take and that have been prescribed to you. Take any new Medicines after you have completely understood and accept all the possible adverse reactions/side effects.   Please note  You were cared for by a hospitalist during your hospital stay. If you have any questions about your discharge medications or the care you received while you were in the hospital after you are  discharged, you can call the unit and asked to speak with the hospitalist on call if the hospitalist that took care of you is not available. Once you are discharged, your primary care physician will handle any further medical issues. Please note that NO REFILLS for any discharge medications will be authorized once you are discharged, as it is imperative that you return to your primary care physician (or establish a relationship with a primary care physician if you do not have one) for your aftercare needs so that they can reassess your need for medications and monitor your lab values. Today   SUBJECTIVE  Out i the chair. Feels a lot better   VITAL SIGNS:  Blood pressure 103/51, pulse 66, temperature 97.8 F (36.6 C), temperature source Oral, resp. rate 20, height  (1.753 m), weight 213 lb 1.6 oz (96.662 kg), SpO2 95 %.  I/O:   Intake/Output Summary (Last 24 hours) at 04/24/15 1045 Last data filed at 04/24/15 0900  Gross per 24 hour  Intake    480 ml  Output    200 ml  Net    280 ml    PHYSICAL EXAMINATION:  GENERAL:  81 y.o.-year-old patient lying in the bed with no acute distress.  EYES: Pupils equal, round, reactive to light and accommodation. No scleral icterus. Extraocular muscles intact.  HEENT: Head atraumatic, normocephalic. Oropharynx and nasopharynx clear.  NECK:  Supple, no jugular venous distention. No thyroid enlargement, no tenderness.  LUNGS: Normal breath sounds bilaterally, no wheezing, rales,rhonchi or crepitation. No use of accessory muscles of respiration.  CARDIOVASCULAR: S1, S2 normal. No murmurs, rubs, or gallops.  ABDOMEN: Soft, non-tender, non-distended. Bowel sounds present. No organomegaly or mass.  EXTREMITIES: No pedal edema, cyanosis, or clubbing.  NEUROLOGIC: Cranial nerves II through XII are intact. Muscle strength 5/5 in all extremities. Sensation intact. Gait not checked.  PSYCHIATRIC: The patient is alert and oriented x 3.  SKIN: No obvious rash,  lesion, or ulcer.   DATA REVIEW:   CBC   Recent Labs Lab 04/22/15 0304  WBC 10.4  HGB 13.8  HCT 42.5  PLT 181    Chemistries   Recent Labs Lab 04/22/15 0304  NA 142  K 4.9  CL 106  CO2 30  GLUCOSE 157*  BUN 25*  CREATININE 1.49*  CALCIUM 8.4*  AST 20  ALT 24  ALKPHOS 70  BILITOT 0.9    Microbiology Results   Recent Results (from the past 240 hour(s))  MRSA PCR Screening     Status: None   Collection Time: 04/21/15  3:25 PM  Result Value Ref Range Status   MRSA by PCR NEGATIVE NEGATIVE Final    Comment:        The GeneXpert MRSA Assay (FDA approved for NASAL specimens only), is one component of a comprehensive MRSA colonization surveillance program. It is not intended to diagnose MRSA infection nor to guide or monitor treatment for MRSA infections.     RADIOLOGY:  No results found.   Management plans discussed with the patient, family and they are in agreement.  CODE STATUS:     Code Status Orders        Start     Ordered   04/21/15 1517  Full code   Continuous     04/21/15 1516      TOTAL TIME TAKING CARE OF THIS PATIENT:  minutes.    Tunisha Ruland M.D on 04/24/2015 at 10:45 AM  Between 7am to 6pm - Pager - [phone] After 6pm go to [email or web address] - password [email or web address] [facility]  [facility]  Office  [phone]  CC: Primary care physician; Megan Mans, MD

## 2015-04-26 LAB — GLUCOSE, CAPILLARY
GLUCOSE-CAPILLARY: 132 mg/dL — AB (ref 65–99)
GLUCOSE-CAPILLARY: 93 mg/dL (ref 65–99)
Glucose-Capillary: 138 mg/dL — ABNORMAL HIGH (ref 65–99)

## 2015-05-01 ENCOUNTER — Encounter: Payer: Self-pay | Admitting: Family Medicine

## 2015-05-01 ENCOUNTER — Ambulatory Visit (INDEPENDENT_AMBULATORY_CARE_PROVIDER_SITE_OTHER): Payer: Medicare Other | Admitting: Family Medicine

## 2015-05-01 VITALS — BP 118/64 | HR 82 | Temp 97.7°F | Resp 18 | Wt 216.0 lb

## 2015-05-01 DIAGNOSIS — J9600 Acute respiratory failure, unspecified whether with hypoxia or hypercapnia: Secondary | ICD-10-CM

## 2015-05-01 DIAGNOSIS — J441 Chronic obstructive pulmonary disease with (acute) exacerbation: Secondary | ICD-10-CM

## 2015-05-01 DIAGNOSIS — I499 Cardiac arrhythmia, unspecified: Secondary | ICD-10-CM | POA: Diagnosis not present

## 2015-05-01 DIAGNOSIS — N183 Chronic kidney disease, stage 3 (moderate): Secondary | ICD-10-CM | POA: Diagnosis not present

## 2015-05-01 NOTE — Progress Notes (Signed)
Patient ID: Jack Jenkins, male   DOB: April 04, 1931, 81 y.o.   MRN: [id number]    Subjective:  HPI Pt is here for a hospital follow up. He had acute hypoxic respiratory failure, CKD stage 3.  Medications started Zpak, Ceftin, Dulera, Spiriva, prednisone, ASA. His Vasotec was d/c'd due to hypotension.   Pt reports that he is feeling much better than he was. Still not as strong and is on 2L of continuous Ox.   Prior to Admission medications   Medication Sig Start Date End Date Taking? Authorizing Provider  allopurinol (ZYLOPRIM) 100 MG tablet Take 150 mg by mouth daily as needed (for gout.).  09/03/14  Yes Historical Provider, MD  aspirin EC 81 MG EC tablet Take 1 tablet (81 mg total) by mouth daily. 04/24/15  Yes Enedina Finner, MD  mometasone-formoterol (DULERA) 100-5 MCG/ACT AERO Inhale 2 puffs into the lungs 2 (two) times daily. 04/24/15  Yes Enedina Finner, MD  tiotropium (SPIRIVA) 18 MCG inhalation capsule Place 1 capsule (18 mcg total) into inhaler and inhale daily. 04/24/15  Yes Enedina Finner, MD  enalapril (VASOTEC) 2.5 MG tablet Take 1 tablet (2.5 mg total) by mouth daily. Patient not taking: Reported on 05/01/2015 12/17/14   Anola Gurney, PA  ranitidine (ZANTAC) 150 MG tablet Take 1 tablet (150 mg total) by mouth 2 (two) times daily. Patient not taking: Reported on 05/01/2015 12/17/14   Anola Gurney, PA    Patient Active Problem List   Diagnosis Date Noted  . Acute respiratory failure (HCC) 04/21/2015  . Dyspnea 04/21/2015  . COPD exacerbation (HCC) 04/21/2015  . Abnormal chest CT 04/21/2015  . CKD (chronic kidney disease) 12/17/2014  . Photokeratitis 11/06/2014  . Allergic rhinitis 11/06/2014  . AB (asthmatic bronchitis) 11/06/2014  . CCF (congestive cardiac failure) (HCC) 11/06/2014  . Colon polyp 11/06/2014  . Urinary system disease 11/06/2014  . ED (erectile dysfunction) of organic origin 11/06/2014  . Acid reflux 11/06/2014  . Acute gouty arthropathy 11/06/2014  . Gout 11/06/2014    . Arthritis, degenerative 11/06/2014  . Fast heart beat 11/06/2014    History reviewed. No pertinent past medical history.  Social History   Social History  . Marital Status: Married    Spouse Name: N/A  . Number of Children: N/A  . Years of Education: N/A   Occupational History  . Not on file.   Social History Main Topics  . Smoking status: Never Smoker   . Smokeless tobacco: Never Used  . Alcohol Use: No  . Drug Use: No  . Sexual Activity: No   Other Topics Concern  . Not on file   Social History Narrative    No Known Allergies  Review of Systems  Constitutional: Negative.   HENT: Negative.   Eyes: Negative.   Respiratory: Negative.   Cardiovascular: Positive for orthopnea.  Gastrointestinal: Negative.   Genitourinary: Negative.   Musculoskeletal: Negative.   Skin: Negative.   Neurological: Negative.   Endo/Heme/Allergies: Negative.   Psychiatric/Behavioral: Negative.     Immunization History  Administered Date(s) Administered  . Pneumococcal Conjugate-13 09/15/2013  . Td 06/04/2004   Objective:  BP 118/64 mmHg  Pulse 82  Temp(Src) 97.7 F (36.5 C) (Oral)  Resp 18  Wt 216 lb (97.977 kg)  SpO2 96%  Physical Exam  Constitutional: He is oriented to person, place, and time and well-developed, well-nourished, and in no distress.  Eyes: Conjunctivae and EOM are normal. Pupils are equal, round, and reactive to light.  Neck: Normal range  of motion. Neck supple.  Cardiovascular: Normal rate, regular rhythm, normal heart sounds and intact distal pulses.   Pulmonary/Chest: Effort normal and breath sounds normal.  Abdominal: Soft. Bowel sounds are normal.  Musculoskeletal: Normal range of motion. He exhibits edema.  1+ lower extremity edema  Neurological: He is alert and oriented to person, place, and time. He has normal reflexes. Gait normal. GCS score is 15.  Skin: Skin is warm and dry.  Psychiatric: Mood, memory, affect and judgment normal.    Lab  Results  Component Value Date   WBC 10.4 04/22/2015   HGB 13.8 04/22/2015   HCT 42.5 04/22/2015   PLT 181 04/22/2015   GLUCOSE 157* 04/22/2015   CHOL 144 09/18/2013   TRIG 64 09/18/2013   HDL 50 09/18/2013   LDLCALC 81 09/18/2013   TSH 3.54 09/18/2013   PSA 1.6 09/18/2013   HGBA1C 5.1 04/21/2015    CMP     Component Value Date/Time   NA 142 04/22/2015 0304   NA 148* 12/31/2014 1204   NA 140 05/17/2013 0638   K 4.9 04/22/2015 0304   K 4.7 05/17/2013 0638   CL 106 04/22/2015 0304   CL 102 05/17/2013 0638   CO2 30 04/22/2015 0304   CO2 34* 05/17/2013 0638   GLUCOSE 157* 04/22/2015 0304   GLUCOSE 64* 12/31/2014 1204   GLUCOSE 136* 05/17/2013 0638   BUN 25* 04/22/2015 0304   BUN 20 12/31/2014 1204   BUN 53* 05/17/2013 0638   CREATININE 1.49* 04/22/2015 0304   CREATININE 1.6* 09/18/2013   CREATININE 2.56* 05/18/2013 0412   CALCIUM 8.4* 04/22/2015 0304   CALCIUM 8.5 05/17/2013 0638   PROT 6.2* 04/22/2015 0304   PROT 6.8 12/31/2014 1204   ALBUMIN 2.6* 04/22/2015 0304   ALBUMIN 4.0 12/31/2014 1204   AST 20 04/22/2015 0304   ALT 24 04/22/2015 0304   ALKPHOS 70 04/22/2015 0304   BILITOT 0.9 04/22/2015 0304   BILITOT 0.7 12/31/2014 1204   GFRNONAA 42* 04/22/2015 0304   GFRNONAA 23* 05/18/2013 0412   GFRAA 49* 04/22/2015 0304   GFRAA 26* 05/18/2013 0412    Assessment and Plan :  1. Acute respiratory failure, unspecified whether with hypoxia or hypercapnia (HCC) It is possible that patient has lung cancer or MAI as a source of his respiratory failure. He has follow-up with pulmonary next month.  2. COPD exacerbation (HCC)   3. Irregular heart beat  - EKG 12-Lead  4. CKD (chronic kidney disease), stage 3 (moderate) - Renal function panel   Patient was seen and examined by Dr. Julieanne Mansonichard Gilbert, and noted scribed by Dimas ChyleBrittany Byrd, CMA  Julieanne Mansonichard Gilbert MD [facility] 05/01/2015 3:20 PM

## 2015-05-02 ENCOUNTER — Telehealth: Payer: Self-pay

## 2015-05-02 LAB — RENAL FUNCTION PANEL
ALBUMIN: 3.5 g/dL (ref 3.5–4.7)
BUN/Creatinine Ratio: 16 (ref 10–22)
BUN: 29 mg/dL — ABNORMAL HIGH (ref 8–27)
CALCIUM: 9.1 mg/dL (ref 8.6–10.2)
CO2: 33 mmol/L — ABNORMAL HIGH (ref 18–29)
CREATININE: 1.79 mg/dL — AB (ref 0.76–1.27)
Chloride: 107 mmol/L — ABNORMAL HIGH (ref 96–106)
GFR calc Af Amer: 40 mL/min/{1.73_m2} — ABNORMAL LOW (ref >59)
GFR, EST NON AFRICAN AMERICAN: 35 mL/min/{1.73_m2} — AB (ref >59)
GLUCOSE: 83 mg/dL (ref 65–99)
PHOSPHORUS: 3.3 mg/dL (ref 2.5–4.5)
POTASSIUM: 6.1 mmol/L — AB (ref 3.5–5.2)
SODIUM: 150 mmol/L — AB (ref 134–144)

## 2015-05-02 NOTE — Telephone Encounter (Signed)
Please have drLateefm call me about mr Jack Jenkins. thx

## 2015-05-02 NOTE — Telephone Encounter (Signed)
Done-aa 

## 2015-05-02 NOTE — Telephone Encounter (Signed)
Patient's wife says they called Dr. Cherylann RatelLateef about the potasium and they wont be able to see him till Tuesday. Wife states if he needs to be seen sooner our office would need to call and see if we can work him in sooner. Please review. Can he wait till December 27th-next week or is it more urgent?-aa

## 2015-05-08 ENCOUNTER — Other Ambulatory Visit: Payer: Self-pay | Admitting: Nephrology

## 2015-05-08 DIAGNOSIS — R609 Edema, unspecified: Secondary | ICD-10-CM

## 2015-05-09 ENCOUNTER — Other Ambulatory Visit
Admission: RE | Admit: 2015-05-09 | Discharge: 2015-05-09 | Disposition: A | Discharge status: Home / Self Care | Payer: Medicare Other | Source: Ambulatory Visit | Attending: Nephrology | Admitting: Nephrology

## 2015-05-09 DIAGNOSIS — E875 Hyperkalemia: Secondary | ICD-10-CM | POA: Diagnosis present

## 2015-05-09 LAB — BASIC METABOLIC PANEL
Anion gap: 3 — ABNORMAL LOW (ref 5–15)
BUN: 21 mg/dL — ABNORMAL HIGH (ref 6–20)
CO2: 33 mmol/L — ABNORMAL HIGH (ref 22–32)
CREATININE: 1.64 mg/dL — AB (ref 0.61–1.24)
Calcium: 9 mg/dL (ref 8.9–10.3)
Chloride: 107 mmol/L (ref 101–111)
GFR calc Af Amer: 43 mL/min — ABNORMAL LOW (ref >60)
GFR calc non Af Amer: 37 mL/min — ABNORMAL LOW (ref >60)
Glucose, Bld: 126 mg/dL — ABNORMAL HIGH (ref 65–99)
Potassium: 6.3 mmol/L — ABNORMAL HIGH (ref 3.5–5.1)
SODIUM: 143 mmol/L (ref 135–145)

## 2015-05-16 ENCOUNTER — Other Ambulatory Visit: Payer: Self-pay

## 2015-05-16 ENCOUNTER — Encounter: Payer: Self-pay | Admitting: Family Medicine

## 2015-05-29 ENCOUNTER — Other Ambulatory Visit: Payer: Self-pay

## 2015-06-03 ENCOUNTER — Ambulatory Visit
Admission: RE | Admit: 2015-06-03 | Discharge: 2015-06-03 | Disposition: A | Discharge status: Home / Self Care | Payer: Medicare Other | Source: Ambulatory Visit | Attending: Nephrology | Admitting: Nephrology

## 2015-06-03 DIAGNOSIS — R609 Edema, unspecified: Secondary | ICD-10-CM | POA: Diagnosis present

## 2015-06-03 DIAGNOSIS — I071 Rheumatic tricuspid insufficiency: Secondary | ICD-10-CM | POA: Diagnosis not present

## 2015-06-03 DIAGNOSIS — I340 Nonrheumatic mitral (valve) insufficiency: Secondary | ICD-10-CM | POA: Diagnosis not present

## 2015-06-03 NOTE — Progress Notes (Signed)
*  PRELIMINARY RESULTS* Echocardiogram 2D Echocardiogram has been performed.  Georgann Housekeeper Hege 06/03/2015, 11:19 AM

## 2015-06-13 ENCOUNTER — Encounter: Payer: Self-pay | Admitting: Family Medicine

## 2015-07-01 ENCOUNTER — Ambulatory Visit: Payer: Medicare Other | Admitting: Family Medicine

## 2015-07-10 ENCOUNTER — Other Ambulatory Visit: Payer: Self-pay | Admitting: Physician Assistant

## 2015-07-10 DIAGNOSIS — R911 Solitary pulmonary nodule: Secondary | ICD-10-CM

## 2015-07-23 ENCOUNTER — Ambulatory Visit
Admission: RE | Admit: 2015-07-23 | Discharge: 2015-07-23 | Disposition: A | Discharge status: Home / Self Care | Payer: Medicare Other | Source: Ambulatory Visit | Attending: Physician Assistant | Admitting: Physician Assistant

## 2015-07-23 DIAGNOSIS — R918 Other nonspecific abnormal finding of lung field: Secondary | ICD-10-CM | POA: Insufficient documentation

## 2015-07-23 DIAGNOSIS — R911 Solitary pulmonary nodule: Secondary | ICD-10-CM

## 2015-10-30 ENCOUNTER — Emergency Department
Admission: EM | Admit: 2015-10-30 | Discharge: 2015-10-30 | Disposition: A | Discharge status: Home / Self Care | Payer: Medicare Other | Attending: Emergency Medicine | Admitting: Emergency Medicine

## 2015-10-30 ENCOUNTER — Emergency Department: Payer: Medicare Other

## 2015-10-30 ENCOUNTER — Other Ambulatory Visit: Payer: Self-pay

## 2015-10-30 ENCOUNTER — Encounter: Payer: Self-pay | Admitting: Emergency Medicine

## 2015-10-30 DIAGNOSIS — I509 Heart failure, unspecified: Secondary | ICD-10-CM | POA: Insufficient documentation

## 2015-10-30 DIAGNOSIS — R0602 Shortness of breath: Secondary | ICD-10-CM | POA: Diagnosis present

## 2015-10-30 DIAGNOSIS — R0600 Dyspnea, unspecified: Secondary | ICD-10-CM

## 2015-10-30 DIAGNOSIS — J441 Chronic obstructive pulmonary disease with (acute) exacerbation: Secondary | ICD-10-CM | POA: Diagnosis not present

## 2015-10-30 DIAGNOSIS — J45909 Unspecified asthma, uncomplicated: Secondary | ICD-10-CM | POA: Insufficient documentation

## 2015-10-30 DIAGNOSIS — N189 Chronic kidney disease, unspecified: Secondary | ICD-10-CM | POA: Diagnosis not present

## 2015-10-30 DIAGNOSIS — Z79899 Other long term (current) drug therapy: Secondary | ICD-10-CM | POA: Insufficient documentation

## 2015-10-30 DIAGNOSIS — M1990 Unspecified osteoarthritis, unspecified site: Secondary | ICD-10-CM | POA: Diagnosis not present

## 2015-10-30 HISTORY — DX: Heart failure, unspecified: I50.9

## 2015-10-30 LAB — COMPREHENSIVE METABOLIC PANEL
ALBUMIN: 3.5 g/dL (ref 3.5–5.0)
ALT: 18 U/L (ref 17–63)
AST: 24 U/L (ref 15–41)
Alkaline Phosphatase: 75 U/L (ref 38–126)
Anion gap: 4 — ABNORMAL LOW (ref 5–15)
BILIRUBIN TOTAL: 0.7 mg/dL (ref 0.3–1.2)
BUN: 20 mg/dL (ref 6–20)
CO2: 38 mmol/L — ABNORMAL HIGH (ref 22–32)
CREATININE: 1.61 mg/dL — AB (ref 0.61–1.24)
Calcium: 9.3 mg/dL (ref 8.9–10.3)
Chloride: 106 mmol/L (ref 101–111)
GFR calc Af Amer: 44 mL/min — ABNORMAL LOW (ref >60)
GFR, EST NON AFRICAN AMERICAN: 38 mL/min — AB (ref >60)
GLUCOSE: 137 mg/dL — AB (ref 65–99)
Potassium: 5 mmol/L (ref 3.5–5.1)
SODIUM: 148 mmol/L — AB (ref 135–145)
TOTAL PROTEIN: 6.3 g/dL — AB (ref 6.5–8.1)

## 2015-10-30 LAB — CBC
HCT: 43.8 % (ref 40.0–52.0)
HEMOGLOBIN: 14.2 g/dL (ref 13.0–18.0)
MCH: 30.4 pg (ref 26.0–34.0)
MCHC: 32.3 g/dL (ref 32.0–36.0)
MCV: 94 fL (ref 80.0–100.0)
Platelets: 114 10*3/uL — ABNORMAL LOW (ref 150–440)
RBC: 4.66 MIL/uL (ref 4.40–5.90)
RDW: 14.5 % (ref 11.5–14.5)
WBC: 4.8 10*3/uL (ref 3.8–10.6)

## 2015-10-30 LAB — BRAIN NATRIURETIC PEPTIDE: B Natriuretic Peptide: 86 pg/mL (ref 0.0–100.0)

## 2015-10-30 LAB — TROPONIN I

## 2015-10-30 NOTE — Discharge Instructions (Signed)
You have been seen in the emergency department today for shortness of breath. Workup as shown largely normal results. As we discussed please take your fluid pill/furosemide every day for the next 3 days. Please follow-up with your primary care doctor this week for recheck. Return to the emergency department for any increased trouble breathing, any chest pain, or any other symptom personally concerning to your self.   Shortness of Breath Shortness of breath means you have trouble breathing. Shortness of breath needs medical care right away. HOME CARE   Do not smoke.  Avoid being around chemicals or things (paint fumes, dust) that may bother your breathing.  Rest as needed. Slowly begin your normal activities.  Only take medicines as told by your doctor.  Keep all doctor visits as told. GET HELP RIGHT AWAY IF:   Your shortness of breath gets worse.  You feel lightheaded, pass out (faint), or have a cough that is not helped by medicine.  You cough up blood.  You have pain with breathing.  You have pain in your chest, arms, shoulders, or belly (abdomen).  You have a fever.  You cannot walk up stairs or exercise the way you normally do.  You do not get better in the time expected.  You have a hard time doing normal activities even with rest.  You have problems with your medicines.  You have any new symptoms. MAKE SURE YOU:  Understand these instructions.  Will watch your condition.  Will get help right away if you are not doing well or get worse.   This information is not intended to replace advice given to you by your health care provider. Make sure you discuss any questions you have with your health care provider.   Document Released: 10/14/2007 Document Revised: 05/02/2013 Document Reviewed: 07/13/2011 Elsevier Interactive Patient Education [employer]2016 Elsevier Inc.

## 2015-10-30 NOTE — ED Notes (Signed)
Pt presents with shortness of breath for two days. Pt on home 02 continuous. Pt with hx of CHF. Pt states he does take lasix but not everyday and feels short of breath and when he takes his 02 off his oxygen drops in the 80's.

## 2015-10-30 NOTE — ED Notes (Signed)
Triage tech is currently connecting him to the monitor - gown changed

## 2015-10-30 NOTE — ED Provider Notes (Signed)
[facility] Emergency Department Provider Note  Time seen: 10:16 AM  I have reviewed the triage vital signs and the nursing notes.   HISTORY  Chief Complaint Shortness of Breath    HPI Jack Jenkins is a 82 y.o. male with a past medical history of congestive heart failure who presents to the emergency department difficulty breathing. According to the patient for the past 1-2 weeks he has felt that his shortness of breath has increased, he feels like it is gotten worse over the past several days. One week ago he was seen by his primary care doctor started him on 2 L of oxygen at home 24/7. The patient states he has been off oxygen since March. His doctor also prescribed him Lasix to be taken as needed, the patient minutes he has not taken any over the past 3-4 days, but states his weight has not increased which is why he did not take any of the medication. Patient states the shortness breath is somewhat worse with exertion. Denies any chest pain, pressure or tightness. Denies any nausea or diaphoresis. Denies any leg pain, states very minimal swelling which is fairly typical for him.     Past Medical History  Diagnosis Date  . CHF (congestive heart failure) [facility])     Patient Active Problem List   Diagnosis Date Noted  . Acute respiratory failure (HCC) 04/21/2015  . Dyspnea 04/21/2015  . COPD exacerbation (HCC) 04/21/2015  . Abnormal chest CT 04/21/2015  . CKD (chronic kidney disease) 12/17/2014  . Photokeratitis 11/06/2014  . Allergic rhinitis 11/06/2014  . AB (asthmatic bronchitis) 11/06/2014  . CCF (congestive cardiac failure) (HCC) 11/06/2014  . Colon polyp 11/06/2014  . Urinary system disease 11/06/2014  . ED (erectile dysfunction) of organic origin 11/06/2014  . Acid reflux 11/06/2014  . Acute gouty arthropathy 11/06/2014  . Gout 11/06/2014  . Arthritis, degenerative 11/06/2014  . Fast heart beat 11/06/2014    Past Surgical History  Procedure  Laterality Date  . Cataract extraction Bilateral   . Cholecystectomy    . Back surgery    . Hernia repair    . Polypectomy      colon  . Laminectomy    . Upper gi endoscopy  01/15/04    Current Outpatient Rx  Name  Route  Sig  Dispense  Refill  . allopurinol (ZYLOPRIM) 100 MG tablet   Oral   Take 150 mg by mouth daily as needed (for gout.).          Marland Kitchen aspirin EC 81 MG EC tablet   Oral   Take 1 tablet (81 mg total) by mouth daily.   30 tablet   0   . enalapril (VASOTEC) 2.5 MG tablet   Oral   Take 1 tablet (2.5 mg total) by mouth daily. Patient not taking: Reported on 05/01/2015   90 tablet   1   . mometasone-formoterol (DULERA) 100-5 MCG/ACT AERO   Inhalation   Inhale 2 puffs into the lungs 2 (two) times daily.   1 Inhaler   1   . ranitidine (ZANTAC) 150 MG tablet   Oral   Take 1 tablet (150 mg total) by mouth 2 (two) times daily. Patient not taking: Reported on 05/01/2015   60 tablet   5   . tiotropium (SPIRIVA) 18 MCG inhalation capsule   Inhalation   Place 1 capsule (18 mcg total) into inhaler and inhale daily.   30 capsule   12  Allergies Review of patient's allergies indicates no known allergies.  Family History  Problem Relation Age of Onset  . Heart failure Mother   . Heart attack Father   . CAD Sister   . Lung cancer Brother   . CAD Sister     Social History Social History  Substance Use Topics  . Smoking status: Never Smoker   . Smokeless tobacco: Never Used  . Alcohol Use: No    Review of Systems Constitutional: Negative for fever. Cardiovascular: Negative for chest pain. Respiratory: Positive for shortness of breath Gastrointestinal: Negative for abdominal pain Musculoskeletal: Negative for back pain Neurological: Negative for headache 10-point ROS otherwise negative.  ____________________________________________   PHYSICAL EXAM:  VITAL SIGNS: ED Triage Vitals  Enc Vitals Group     BP 10/30/15 0932 133/62 mmHg      Pulse Rate 10/30/15 0932 86     Resp 10/30/15 0932 20     Temp 10/30/15 0933 98.9 F (37.2 C)     Temp Source 10/30/15 0933 Oral     SpO2 10/30/15 0932 97 %     Weight 10/30/15 0932 216 lb (97.977 kg)     Height 10/30/15 0932 5\' 9"  (1.753 m)     Head Cir --      Peak Flow --      Pain Score --      Pain Loc --      Pain Edu? --      Excl. in GC? --     Constitutional: Alert and oriented. Well appearing and in no distress. Eyes: Normal exam ENT   Head: Normocephalic and atraumatic.   Mouth/Throat: Mucous membranes are moist. Cardiovascular: Irregular rhythm around 90 bpm, appears to have frequent PVCs on a chair while auscultating. Respiratory: Normal respiratory effort without tachypnea nor retractions. Breath sounds are clear. No wheeze, rales or rhonchi auscultated. Gastrointestinal: Soft and nontender. No distention.  Musculoskeletal: Nontender with normal range of motion in all extremities. Slight lower extremity edema bilaterally. No calf tenderness. Neurologic:  Normal speech and language. No gross focal neurologic deficits Skin:  Skin is warm, dry and intact.  Psychiatric: Mood and affect are normal.  ____________________________________________    EKG  EKG reviewed and interpreted, so shows sinus rhythm at 95 bpm, occasional PVC. No concerning ST changes noted. Normal axis, slightly widened QRS complex.  ____________________________________________    RADIOLOGY  No acute abnormality on chest x-ray  ____________________________________________   INITIAL IMPRESSION / ASSESSMENT AND PLAN / ED COURSE  Pertinent labs & imaging results that were available during my care of the patient were reviewed by me and considered in my medical decision making (see chart for details).  The patient presents the emergency department or shortness of breath ongoing for the past 2 weeks, worse over the past 2-3 days. We will check labs, chest x-ray, and closely monitor in  the emergency department.  Labs have resulted within normal limits including negative troponin. BNP appears within normal limits. Chest x-ray overall clear. The patient states in the mornings he feels short of breath, he is able to cough and get clear sputum production and then states shortness of breath largely goes away. Denies any shortness of breath at this time I'll wearing his 2 L of oxygen. Patient wears 2 L of oxygen at home. We will discharge home with PCP follow-up this week. I discussed return precautions for worsening shortness breath or chest pain. Patient agreeable. ____________________________________________   FINAL CLINICAL IMPRESSION(S) / ED DIAGNOSES  Dyspnea   Minna Antis, MD 10/30/15 1153

## 2016-01-09 ENCOUNTER — Other Ambulatory Visit: Payer: Self-pay | Admitting: Internal Medicine

## 2016-01-09 DIAGNOSIS — J479 Bronchiectasis, uncomplicated: Secondary | ICD-10-CM

## 2016-01-23 ENCOUNTER — Ambulatory Visit
Admission: RE | Admit: 2016-01-23 | Discharge: 2016-01-23 | Disposition: A | Discharge status: Home / Self Care | Payer: Medicare Other | Source: Ambulatory Visit | Attending: Internal Medicine | Admitting: Internal Medicine

## 2016-01-23 DIAGNOSIS — J479 Bronchiectasis, uncomplicated: Secondary | ICD-10-CM | POA: Insufficient documentation

## 2016-01-23 DIAGNOSIS — I700 Atherosclerosis of aorta: Secondary | ICD-10-CM | POA: Insufficient documentation

## 2016-01-23 DIAGNOSIS — R918 Other nonspecific abnormal finding of lung field: Secondary | ICD-10-CM | POA: Diagnosis not present

## 2016-02-13 ENCOUNTER — Encounter: Payer: Self-pay | Admitting: Family Medicine

## 2016-07-22 ENCOUNTER — Telehealth: Payer: Self-pay | Admitting: Family Medicine

## 2016-07-22 NOTE — Telephone Encounter (Signed)
Called Pt to schedule AWV with [facility] - knb °

## 2016-09-30 ENCOUNTER — Telehealth: Payer: Self-pay | Admitting: Family Medicine

## 2016-09-30 NOTE — Telephone Encounter (Signed)
Patient contacted to schedule AWV °

## 2016-10-07 ENCOUNTER — Encounter: Payer: Self-pay | Admitting: Emergency Medicine

## 2016-10-07 ENCOUNTER — Emergency Department: Payer: Medicare Other

## 2016-10-07 ENCOUNTER — Observation Stay
Admission: EM | Admit: 2016-10-07 | Discharge: 2016-10-08 | Disposition: A | Discharge status: Home / Self Care | Payer: Medicare Other | Attending: Internal Medicine | Admitting: Internal Medicine

## 2016-10-07 DIAGNOSIS — Z8249 Family history of ischemic heart disease and other diseases of the circulatory system: Secondary | ICD-10-CM | POA: Diagnosis not present

## 2016-10-07 DIAGNOSIS — Z9981 Dependence on supplemental oxygen: Secondary | ICD-10-CM | POA: Insufficient documentation

## 2016-10-07 DIAGNOSIS — R079 Chest pain, unspecified: Secondary | ICD-10-CM | POA: Diagnosis not present

## 2016-10-07 DIAGNOSIS — R918 Other nonspecific abnormal finding of lung field: Secondary | ICD-10-CM | POA: Insufficient documentation

## 2016-10-07 DIAGNOSIS — Z79899 Other long term (current) drug therapy: Secondary | ICD-10-CM | POA: Diagnosis not present

## 2016-10-07 DIAGNOSIS — I509 Heart failure, unspecified: Secondary | ICD-10-CM | POA: Insufficient documentation

## 2016-10-07 DIAGNOSIS — M109 Gout, unspecified: Secondary | ICD-10-CM | POA: Diagnosis not present

## 2016-10-07 DIAGNOSIS — K219 Gastro-esophageal reflux disease without esophagitis: Secondary | ICD-10-CM | POA: Diagnosis not present

## 2016-10-07 DIAGNOSIS — N183 Chronic kidney disease, stage 3 (moderate): Secondary | ICD-10-CM | POA: Insufficient documentation

## 2016-10-07 DIAGNOSIS — J9611 Chronic respiratory failure with hypoxia: Secondary | ICD-10-CM | POA: Insufficient documentation

## 2016-10-07 DIAGNOSIS — Z7982 Long term (current) use of aspirin: Secondary | ICD-10-CM | POA: Diagnosis not present

## 2016-10-07 DIAGNOSIS — J449 Chronic obstructive pulmonary disease, unspecified: Secondary | ICD-10-CM | POA: Diagnosis not present

## 2016-10-07 LAB — COMPREHENSIVE METABOLIC PANEL
ALBUMIN: 3.5 g/dL (ref 3.5–5.0)
ALK PHOS: 92 U/L (ref 38–126)
ALT: 41 U/L (ref 17–63)
ANION GAP: 5 (ref 5–15)
AST: 48 U/L — AB (ref 15–41)
BUN: 23 mg/dL — AB (ref 6–20)
CALCIUM: 9.1 mg/dL (ref 8.9–10.3)
CO2: 39 mmol/L — ABNORMAL HIGH (ref 22–32)
Chloride: 103 mmol/L (ref 101–111)
Creatinine, Ser: 1.47 mg/dL — ABNORMAL HIGH (ref 0.61–1.24)
GFR calc Af Amer: 49 mL/min — ABNORMAL LOW (ref >60)
GFR calc non Af Amer: 42 mL/min — ABNORMAL LOW (ref >60)
GLUCOSE: 190 mg/dL — AB (ref 65–99)
Potassium: 4.4 mmol/L (ref 3.5–5.1)
SODIUM: 147 mmol/L — AB (ref 135–145)
Total Bilirubin: 0.9 mg/dL (ref 0.3–1.2)
Total Protein: 6.7 g/dL (ref 6.5–8.1)

## 2016-10-07 LAB — CBC
HEMATOCRIT: 38.2 % — AB (ref 40.0–52.0)
Hemoglobin: 12.3 g/dL — ABNORMAL LOW (ref 13.0–18.0)
MCH: 30.8 pg (ref 26.0–34.0)
MCHC: 32.1 g/dL (ref 32.0–36.0)
MCV: 95.8 fL (ref 80.0–100.0)
Platelets: 138 10*3/uL — ABNORMAL LOW (ref 150–440)
RBC: 3.99 MIL/uL — AB (ref 4.40–5.90)
RDW: 15.5 % — AB (ref 11.5–14.5)
WBC: 4.1 10*3/uL (ref 3.8–10.6)

## 2016-10-07 LAB — TROPONIN I
Troponin I: <0.03 ng/mL (ref <0.03)
Troponin I: <0.03 ng/mL (ref <0.03)
Troponin I: <0.03 ng/mL (ref <0.03)

## 2016-10-07 MED ORDER — NITROGLYCERIN 2 % TD OINT
1.0000 [in_us] | TOPICAL_OINTMENT | Freq: Once | TRANSDERMAL | Status: AC
  Administered 2016-10-07: 1 [in_us] via TOPICAL
  Filled 2016-10-07: qty 1

## 2016-10-07 MED ORDER — ENOXAPARIN SODIUM 40 MG/0.4ML ~~LOC~~ SOLN
40.0000 mg | SUBCUTANEOUS | Status: DC
  Administered 2016-10-07: 40 mg via SUBCUTANEOUS
  Filled 2016-10-07: qty 0.4

## 2016-10-07 MED ORDER — ASPIRIN EC 81 MG PO TBEC: 81.0000 mg | DELAYED_RELEASE_TABLET | Freq: Every day | ORAL | Status: DC

## 2016-10-07 MED ORDER — FUROSEMIDE 40 MG PO TABS: 40.0000 mg | ORAL_TABLET | Freq: Every day | ORAL | Status: DC | PRN

## 2016-10-07 MED ORDER — MOMETASONE FURO-FORMOTEROL FUM 100-5 MCG/ACT IN AERO
2.0000 | INHALATION_SPRAY | Freq: Two times a day (BID) | RESPIRATORY_TRACT | Status: DC
  Administered 2016-10-07 – 2016-10-08 (×2): 2 via RESPIRATORY_TRACT
  Filled 2016-10-07: qty 8.8

## 2016-10-07 MED ORDER — METOPROLOL TARTRATE 25 MG PO TABS
25.0000 mg | ORAL_TABLET | Freq: Two times a day (BID) | ORAL | Status: DC
  Administered 2016-10-07 – 2016-10-08 (×2): 25 mg via ORAL
  Filled 2016-10-07 (×2): qty 1

## 2016-10-07 MED ORDER — ONDANSETRON HCL 4 MG/2ML IJ SOLN: 4.0000 mg | Freq: Four times a day (QID) | INTRAMUSCULAR | Status: DC | PRN

## 2016-10-07 MED ORDER — ASPIRIN EC 325 MG PO TBEC
325.0000 mg | DELAYED_RELEASE_TABLET | Freq: Every day | ORAL | Status: DC
  Administered 2016-10-08: 325 mg via ORAL
  Filled 2016-10-07: qty 1

## 2016-10-07 MED ORDER — GI COCKTAIL ~~LOC~~
30.0000 mL | Freq: Four times a day (QID) | ORAL | Status: DC | PRN
  Filled 2016-10-07: qty 30

## 2016-10-07 MED ORDER — ALLOPURINOL 100 MG PO TABS: 100.0000 mg | ORAL_TABLET | Freq: Every day | ORAL | Status: DC | PRN

## 2016-10-07 MED ORDER — ACETAMINOPHEN 325 MG PO TABS: 650.0000 mg | ORAL_TABLET | ORAL | Status: DC | PRN

## 2016-10-07 MED ORDER — MORPHINE SULFATE (PF) 2 MG/ML IV SOLN: 2.0000 mg | INTRAVENOUS | Status: DC | PRN

## 2016-10-07 MED ORDER — ALPRAZOLAM 0.25 MG PO TABS: 0.2500 mg | ORAL_TABLET | Freq: Two times a day (BID) | ORAL | Status: DC | PRN

## 2016-10-07 MED ORDER — COLCHICINE 0.6 MG PO TABS: 0.6000 mg | ORAL_TABLET | Freq: Every day | ORAL | Status: DC | PRN

## 2016-10-07 MED ORDER — SODIUM CHLORIDE 0.9% FLUSH
3.0000 mL | Freq: Two times a day (BID) | INTRAVENOUS | Status: DC
  Administered 2016-10-07 – 2016-10-08 (×2): 3 mL via INTRAVENOUS

## 2016-10-07 NOTE — ED Notes (Signed)
Report given to Kendall, RN.  

## 2016-10-07 NOTE — ED Triage Notes (Addendum)
Patient to ER from home via [facility] for c/o midsternal chest pain that radiated to back. Stated to EMS that pain level was low at time of EMS arrival to home, but currently has no pain upon arrival to ER (resolved en route, no nitro given). Patient has h/o CHF. Patient was given 324 ASA via EMS.

## 2016-10-07 NOTE — Care Management Obs Status (Signed)
MEDICARE OBSERVATION STATUS NOTIFICATION   Patient Details  Name: Jack Jenkins MRN: [id number] Date of Birth: 10-30-1930   Medicare Observation Status Notification Given:  Yes    Berna BueCheryl Dasan Hardman, RN 10/07/2016, 12:49 PM

## 2016-10-07 NOTE — Consult Note (Signed)
[facility]IOLOG[facility]  CARDIOLOGY CONSULT NOTE  Patient ID: Jack Jenkins MRN: [id number] DOB/AGE: [birth year] 81 y.o.  Admit date: 10/07/2016 Referring Physician Dr. Karlene Lineman Primary Physician   Primary Cardiologist none  Reason for Consultation chest pain  HPI: Pt is a 83 yo male with history of copd and asthma who presented to the er with complaints of increasing sob and chest discomfort. Symptoms resolved by the time EMS arrived. In the er, ekg did not show any ischemia. cxr showed cardioomegaly with no acute disease. Chest ct showed no acute findings. He has ruled out for an mi. He has no further chest pain. Echo in 2015 revealed ef 50-60%. He has improved with iv lasix. He is also on metoprolol tartrate. He is currently pain free.   Review of Systems  Constitutional: Negative.   HENT: Negative.   Eyes: Negative.   Respiratory: Positive for shortness of breath.   Cardiovascular: Positive for chest pain.  Gastrointestinal: Negative.   Genitourinary: Negative.   Musculoskeletal: Negative.   Skin: Negative.   Neurological: Negative.   Endo/Heme/Allergies: Negative.   Psychiatric/Behavioral: Negative.     Past Medical History:  Diagnosis Date  . CHF (congestive heart failure) (HCC)     Family History  Problem Relation Age of Onset  . Heart failure Mother   . Heart attack Father   . CAD Sister   . Lung cancer Brother   . CAD Sister     Social History   Social History  . Marital status: Married    Spouse name: N/A  . Number of children: N/A  . Years of education: N/A   Occupational History  . Not on file.   Social History Main Topics  . Smoking status: Never Smoker  . Smokeless tobacco: Never Used  . Alcohol use No  . Drug use: No  . Sexual activity: No   Other Topics Concern  . Not on file   Social History Narrative  . No narrative on file    Past Surgical History:  Procedure Laterality Date  . BACK SURGERY    .  CATARACT EXTRACTION Bilateral   . CHOLECYSTECTOMY    . HERNIA REPAIR    . LAMINECTOMY    . POLYPECTOMY     colon  . UPPER GI ENDOSCOPY  01/15/04     Prescriptions Prior to Admission  Medication Sig Dispense Refill Last Dose  . allopurinol (ZYLOPRIM) 100 MG tablet Take 100 mg by mouth daily as needed (for gout).    10/07/2016 at 0900  . furosemide (LASIX) 40 MG tablet Take 40 mg by mouth daily as needed for fluid.    10/07/2016 at 0900  . tiotropium (SPIRIVA) 18 MCG inhalation capsule Place 1 capsule (18 mcg total) into inhaler and inhale daily. 30 capsule 12 Past Month at prn  . aspirin EC 81 MG EC tablet Take 1 tablet (81 mg total) by mouth daily. (Patient not taking: Reported on 10/30/2015) 30 tablet 0 Not Taking at Unknown time  . colchicine 0.6 MG tablet Take 0.6 mg by mouth daily as needed (for gout).   prn at prn  . mometasone-formoterol (DULERA) 100-5 MCG/ACT AERO Inhale 2 puffs into the lungs 2 (two) times daily. (Patient not taking: Reported on 10/30/2015) 1 Inhaler 1 Not Taking at Unknown time  . ranitidine (ZANTAC) 150 MG tablet Take 1 tablet (150 mg total) by mouth 2 (two) times daily. (Patient not taking: Reported on 10/07/2016) 60  tablet 5 Not Taking at Unknown time    Physical Exam: Blood pressure 107/76, pulse 92, temperature 97.6 F (36.4 C), temperature source Oral, resp. rate 16, height 5\' 9"  (1.753 m), weight 100 kg (220 lb 8 oz), SpO2 99 %.   Wt Readings from Last 1 Encounters:  10/07/16 100 kg (220 lb 8 oz)     Resp: clear to auscultation bilaterally Cardio: regular rate and rhythm GI: soft, non-tender; bowel sounds normal; no masses,  no organomegaly Neurologic: Grossly normal  Labs:   Lab Results  Component Value Date   WBC 4.1 10/07/2016   HGB 12.3 (L) 10/07/2016   HCT 38.2 (L) 10/07/2016   MCV 95.8 10/07/2016   PLT 138 (L) 10/07/2016    Recent Labs Lab 10/07/16 1040  NA 147*  K 4.4  CL 103  CO2 39*  BUN 23*  CREATININE 1.47*  CALCIUM 9.1  PROT  6.7  BILITOT 0.9  ALKPHOS 92  ALT 41  AST 48*  GLUCOSE 190*   Lab Results  Component Value Date   CKTOTAL 27 (L) 05/16/2013   CKMB 1.0 05/16/2013   TROPONINI <0.03 10/07/2016      Radiology: no acute process EKG: nsr with no ischemia  ASSESSMENT AND PLAN:  83 yo male with history of copd admitted after presenting ot the er with 40 min of chest pain. CXR and ekg were unremarkable and he has ruled out for an mi. Functional study scheduled for tomorrow. Will conitnue with current meds and proceed with myovew in am. If negative would discharge to home with outpatient follow up.  Signed: Dalia HeadingKenneth A Nadezhda Pollitt MD, [facility] 10/07/2016, 8:13 PM

## 2016-10-07 NOTE — H&P (Signed)
[employer] - [place] at [facility]   PATIENT NAME: Jack Jenkins    MR#:  [id number]  DATE OF BIRTH:  29-Oct-1930  DATE OF ADMISSION:  10/07/2016  PRIMARY CARE PHYSICIAN: Maple Hudson., MD   REQUESTING/REFERRING PHYSICIAN: Emily Filbert, MD  CHIEF COMPLAINT:   Chief Complaint  Patient presents with  . Chest Pain    HISTORY OF PRESENT ILLNESS:  Jack Jenkins  is a 83 y.o. male with a known history of COPD/asthma, CHF is being admitted for chest pain. it started this morning while he was shaving. Patient describes lower chest wall pain that seemed to radiate into his back. He also had some pain associated with breathing.  His pain was resolved by the time EMS came.  It lasted about 30-45 minutes.  He is not having any pain at this time. PAST MEDICAL HISTORY:   Past Medical History:  Diagnosis Date  . CHF (congestive heart failure) (HCC)     PAST SURGICAL HISTORY:   Past Surgical History:  Procedure Laterality Date  . BACK SURGERY    . CATARACT EXTRACTION Bilateral   . CHOLECYSTECTOMY    . HERNIA REPAIR    . LAMINECTOMY    . POLYPECTOMY     colon  . UPPER GI ENDOSCOPY  01/15/04    SOCIAL HISTORY:   Social History  Substance Use Topics  . Smoking status: Never Smoker  . Smokeless tobacco: Never Used  . Alcohol use No    FAMILY HISTORY:   Family History  Problem Relation Age of Onset  . Heart failure Mother   . Heart attack Father   . CAD Sister   . Lung cancer Brother   . CAD Sister     DRUG ALLERGIES:  No Known Allergies  REVIEW OF SYSTEMS:   Review of Systems  Constitutional: Negative for chills, fever and weight loss.  HENT: Negative for nosebleeds and sore throat.   Eyes: Negative for blurred vision.  Respiratory: Negative for cough, shortness of breath and wheezing.   Cardiovascular: Positive for chest pain. Negative for orthopnea, leg swelling and PND.  Gastrointestinal: Negative for abdominal pain, constipation,  diarrhea, heartburn, nausea and vomiting.  Genitourinary: Negative for dysuria and urgency.  Musculoskeletal: Negative for back pain.  Skin: Negative for rash.  Neurological: Negative for dizziness, speech change, focal weakness and headaches.  Endo/Heme/Allergies: Does not bruise/bleed easily.  Psychiatric/Behavioral: Negative for depression.    MEDICATIONS AT HOME:   Prior to Admission medications   Medication Sig Start Date End Date Taking? Authorizing Provider  allopurinol (ZYLOPRIM) 100 MG tablet Take 100 mg by mouth daily as needed (for gout).    Yes [provider]  furosemide (LASIX) 40 MG tablet Take 40 mg by mouth daily as needed for fluid.    Yes [provider]  tiotropium (SPIRIVA) 18 MCG inhalation capsule Place 1 capsule (18 mcg total) into inhaler and inhale daily. 04/24/15  Yes Enedina Finner, MD  aspirin EC 81 MG EC tablet Take 1 tablet (81 mg total) by mouth daily. Patient not taking: Reported on 10/30/2015 04/24/15   Enedina Finner, MD  colchicine 0.6 MG tablet Take 0.6 mg by mouth daily as needed (for gout).    [provider]  mometasone-formoterol (DULERA) 100-5 MCG/ACT AERO Inhale 2 puffs into the lungs 2 (two) times daily. Patient not taking: Reported on 10/30/2015 04/24/15   Enedina Finner, MD  ranitidine (ZANTAC) 150 MG tablet Take 1 tablet (150 mg total)  by mouth 2 (two) times daily. Patient not taking: Reported on 10/07/2016 12/17/14   Anola Gurneyhauvin, Robert, PA      VITAL SIGNS:  Blood pressure 119/77, pulse 86, temperature 97.5 F (36.4 C), temperature source Oral, resp. rate (!) 21, height 5\' 9"  (1.753 m), weight 100.7 kg (222 lb), SpO2 100 %.  PHYSICAL EXAMINATION:  Physical Exam  GENERAL:  83 y.o.-year-old patient lying in the bed with no acute distress.  EYES: Pupils equal, round, reactive to light and accommodation. No scleral icterus. Extraocular muscles intact.  HEENT: Head atraumatic, normocephalic. Oropharynx and nasopharynx clear.    NECK:  Supple, no jugular venous distention. No thyroid enlargement, no tenderness.  LUNGS: Normal breath sounds bilaterally, no wheezing, rales,rhonchi or crepitation. No use of accessory muscles of respiration.  CARDIOVASCULAR: S1, S2 normal. No murmurs, rubs, or gallops.  ABDOMEN: Soft, nontender, nondistended. Bowel sounds present. No organomegaly or mass.  EXTREMITIES: No pedal edema, cyanosis, or clubbing.  NEUROLOGIC: Cranial nerves II through XII are intact. Muscle strength 5/5 in all extremities. Sensation intact. Gait not checked.  PSYCHIATRIC: The patient is alert and oriented x 3.  SKIN: No obvious rash, lesion, or ulcer.   LABORATORY PANEL:   CBC  Recent Labs Lab 10/07/16 1040  WBC 4.1  HGB 12.3*  HCT 38.2*  PLT 138*   ------------------------------------------------------------------------------------------------------------------  Chemistries   Recent Labs Lab 10/07/16 1040  NA 147*  K 4.4  CL 103  CO2 39*  GLUCOSE 190*  BUN 23*  CREATININE 1.47*  CALCIUM 9.1  AST 48*  ALT 41  ALKPHOS 92  BILITOT 0.9   ------------------------------------------------------------------------------------------------------------------  Cardiac Enzymes  Recent Labs Lab 10/07/16 1040  TROPONINI <0.03   ------------------------------------------------------------------------------------------------------------------  RADIOLOGY:  Dg Chest Port 1 View  Result Date: 10/07/2016 CLINICAL DATA:  Patient to ER for c/o midsternal chest pain that radiated to back. Stated to EMS that pain level was low, but currently has no pain. Patient has h/o CHF EXAM: PORTABLE CHEST 1 VIEW COMPARISON:  10/30/2015 FINDINGS: Patient rotated right. Midline trachea. Mild cardiomegaly. Moderate left hemidiaphragm elevation. No pleural effusion or pneumothorax. Bibasilar volume loss. No congestive failure. No lobar consolidation. Suspect remote left fourth rib trauma, as evidenced by sclerosis,  similar. IMPRESSION: Cardiomegaly, without acute disease. Electronically Signed   By: Jeronimo GreavesKyle  Talbot M.D.   On: 10/07/2016 10:28   IMPRESSION AND PLAN:  Jack Jenkins is a 83 y.o. male has a past medical history significant for COPD/asthma On 2 L oxygen continuous chronically, chronic cough. 's being admitted for chest pain, rule out  * Chest pain - Rule out with serial troponins - Admitted to telemetry - Stress test tomorrow morning - Aspirin - Cardiology consultation  * Chronic hypoxic respiratory failure in the setting of history of pulmonary nodules and history of COPD/asthma - Continue 2 L oxygen via nasal cannula  * Bilateral multiple pulmonary nodules, chronic -Outpatient follow-up with Dr. Welton FlakesKhan -To be followed closely by serial CTs as outpatient. Patient is a nonsmoker  * CKD-III: At baseline - He was taken off his Lasix by his nephrologist recently - We will resume this   * GERD continue Zantac   All the records are reviewed and case discussed with ED provider. Management plans discussed with the patient, family and they are in agreement.  CODE STATUS: Full code  TOTAL TIME TAKING CARE OF THIS PATIENT: 45 minutes.    Delfino LovettVipul Danali Marinos M.D on 10/07/2016 at 2:32 PM  Between 7am to 6pm - Pager -  [phone]  After 6pm go to [email or web address] - password [email or web address] [facility]  [facility] [place] [facility]  Office  [phone]  CC: Primary care physician; Maple Hudson., MD   Note: This dictation was prepared with Dragon dictation along with smaller phrase technology. Any transcriptional errors that result from this process are unintentional.

## 2016-10-07 NOTE — ED Provider Notes (Signed)
[facility] Emergency Department Provider Note       Time seen: ----------------------------------------- 10:06 AM on 10/07/2016 -----------------------------------------     I have reviewed the triage vital signs and the nursing notes.   HISTORY   Chief Complaint No chief complaint on file.    HPI Jack Jenkins is a 83 y.o. male who presents to the ED for chest pain that started this morning while he was shaving. Patient describes lower chest wall pain that seemed to radiate into his back. He also had some pain associated with breathing. He did not have sweats, nausea or other complaints. Patient states his shortness of breath seems to be about at his baseline. Recently he was put on Lasix by his nephrologist. He describes the pain is dull.   Past Medical History:  Diagnosis Date  . CHF (congestive heart failure) [facility](HCC)     Patient Active Problem List   Diagnosis Date Noted  . Acute respiratory failure (HCC) 04/21/2015  . Dyspnea 04/21/2015  . COPD exacerbation (HCC) 04/21/2015  . Abnormal chest CT 04/21/2015  . CKD (chronic kidney disease) 12/17/2014  . Photokeratitis 11/06/2014  . Allergic rhinitis 11/06/2014  . AB (asthmatic bronchitis) 11/06/2014  . CCF (congestive cardiac failure) (HCC) 11/06/2014  . Colon polyp 11/06/2014  . Urinary system disease 11/06/2014  . ED (erectile dysfunction) of organic origin 11/06/2014  . Acid reflux 11/06/2014  . Acute gouty arthropathy 11/06/2014  . Gout 11/06/2014  . Arthritis, degenerative 11/06/2014  . Fast heart beat 11/06/2014    Past Surgical History:  Procedure Laterality Date  . BACK SURGERY    . CATARACT EXTRACTION Bilateral   . CHOLECYSTECTOMY    . HERNIA REPAIR    . LAMINECTOMY    . POLYPECTOMY     colon  . UPPER GI ENDOSCOPY  01/15/04    Allergies Patient has no known allergies.  Social History Social History  Substance Use Topics  . Smoking status: Never Smoker  . Smokeless  tobacco: Never Used  . Alcohol use No    Review of Systems Constitutional: Negative for fever. Eyes: Negative for vision changes ENT:  Negative for congestion, sore throat Cardiovascular:Positive for chest pain Respiratory: Negative for shortness of breath. Gastrointestinal: Negative for abdominal pain, vomiting and diarrhea. Genitourinary: Negative for dysuria. Musculoskeletal: Negative for back pain. Skin: Negative for rash. Neurological: Negative for headaches, focal weakness or numbness.  All systems negative/normal/unremarkable except as stated in the HPI  ____________________________________________   PHYSICAL EXAM:  VITAL SIGNS: ED Triage Vitals  Enc Vitals Group     BP      Pulse      Resp      Temp      Temp src      SpO2      Weight      Height      Head Circumference      Peak Flow      Pain Score      Pain Loc      Pain Edu?      Excl. in GC?    Constitutional: Alert and oriented. Well appearing and in no distress. Eyes: Conjunctivae are normal. Normal extraocular movements. ENT   Head: Normocephalic and atraumatic.   Nose: No congestion/rhinnorhea.   Mouth/Throat: Mucous membranes are moist.   Neck: No stridor. Cardiovascular: Normal rate, regular rhythm. No murmurs, rubs, or gallops. Respiratory: Mild tachypnea with clear breath sounds Gastrointestinal: Soft and nontender. Normal bowel sounds Musculoskeletal: Nontender with  normal range of motion in extremities. No lower extremity tenderness nor edema. Neurologic:  Normal speech and language. No gross focal neurologic deficits are appreciated.  Skin:  Skin is warm, dry and intact. No rash noted. Psychiatric: Mood and affect are normal. Speech and behavior are normal.  ____________________________________________  EKG: Interpreted by me.Sinus rhythm rate 91 bpm, normal PR interval, wide QRS, borderline long QT.  ____________________________________________  ED COURSE:  Pertinent  labs & imaging results that were available during my care of the patient were reviewed by me and considered in my medical decision making (see chart for details). Patient presents for chest pain, we will assess with labs and imaging as indicated.   Procedures ____________________________________________   LABS (pertinent positives/negatives)  Labs Reviewed  CBC - Abnormal; Notable for the following:       Result Value   RBC 3.99 (*)    Hemoglobin 12.3 (*)    HCT 38.2 (*)    RDW 15.5 (*)    Platelets 138 (*)    All other components within normal limits  COMPREHENSIVE METABOLIC PANEL - Abnormal; Notable for the following:    Sodium 147 (*)    CO2 39 (*)    Glucose, Bld 190 (*)    BUN 23 (*)    Creatinine, Ser 1.47 (*)    AST 48 (*)    GFR calc non Af Amer 42 (*)    GFR calc Af Amer 49 (*)    All other components within normal limits  TROPONIN I    RADIOLOGY Images were viewed by me  Chest x-ray IMPRESSION: Cardiomegaly, without acute disease. ____________________________________________  FINAL ASSESSMENT AND PLAN  Chest pain  Plan: Patient's labs and imaging were dictated above. Patient had presented for Chest pain of uncertain etiology. Labs at this point are within normal limits. I will discuss with the hospitalist for cardiac rule out.   Emily Filbert, MD   Note: This note was generated in part or whole with voice recognition software. Voice recognition is usually quite accurate but there are transcription errors that can and very often do occur. I apologize for any typographical errors that were not detected and corrected.     Emily Filbert, MD 10/07/16 [phone]

## 2016-10-08 ENCOUNTER — Encounter: Payer: Self-pay | Admitting: Radiology

## 2016-10-08 ENCOUNTER — Observation Stay: Payer: Medicare Other

## 2016-10-08 ENCOUNTER — Telehealth: Payer: Self-pay | Admitting: Family Medicine

## 2016-10-08 MED ORDER — NITROGLYCERIN 0.4 MG SL SUBL: 0.4000 mg | SUBLINGUAL_TABLET | SUBLINGUAL | 0 refills | Status: AC | PRN

## 2016-10-08 MED ORDER — CLOPIDOGREL BISULFATE 75 MG PO TABS: 75.0000 mg | ORAL_TABLET | Freq: Every day | ORAL | 0 refills | Status: DC

## 2016-10-08 MED ORDER — TECHNETIUM TC 99M TETROFOSMIN IV KIT
13.7410 | PACK | Freq: Once | INTRAVENOUS | Status: AC | PRN
  Administered 2016-10-08: 13.741 via INTRAVENOUS

## 2016-10-08 MED ORDER — METOPROLOL SUCCINATE ER 25 MG PO TB24: 25.0000 mg | ORAL_TABLET | Freq: Every day | ORAL | 0 refills | Status: DC

## 2016-10-08 NOTE — Discharge Instructions (Addendum)

## 2016-10-08 NOTE — Plan of Care (Signed)
Problem: Health Behavior/Discharge Planning: Goal: Ability to manage health-related needs will improve Outcome: Completed/Met Date Met: 10/08/16 Instruction re discharge meds and followup given.  Both patient and wife verbalized understanding.

## 2016-10-08 NOTE — Telephone Encounter (Signed)
[facility] called to schedule a Hospital F/U with Dr. Sullivan LoneGilbert. Pt is being discharged today 10/08/16 and was treated for chest pain. Pt is scheduled to see Dr. Sullivan LoneGilbert on 10/20/16 @ 10:30 a.m. Thanks TNP

## 2016-10-08 NOTE — Plan of Care (Signed)
Problem: Pain Managment: Goal: General experience of comfort will improve Outcome: Progressing No voiced complaints of pain this shift.  SR continues on telemetry.     

## 2016-10-08 NOTE — Progress Notes (Signed)
Patient unable to lay flat the the myoview stress test.  Test was canceled by nuclear medicine.

## 2016-10-08 NOTE — Discharge Summary (Signed)
[employer] - [place] at [facility]   PATIENT NAME: Jack Jenkins    MR#:  [id number]  DATE OF BIRTH:  06-12-1930  DATE OF ADMISSION:  10/07/2016 ADMITTING PHYSICIAN: Delfino Lovett, MD  DATE OF DISCHARGE: 10/08/2016  3:25 PM  PRIMARY CARE PHYSICIAN: Maple Hudson., MD    ADMISSION DIAGNOSIS:  Nonspecific chest pain [R07.9]  DISCHARGE DIAGNOSIS:  Active Problems:   Chest pain   SECONDARY DIAGNOSIS:   Past Medical History:  Diagnosis Date  . CHF (congestive heart failure) (HCC)     HOSPITAL COURSE:   1.  Chest pain after shaving. He had his oxygen off at that time. Currently no recurrence of chest pain while here in the hospital. Cardiac enzymes were negative 4. Patient could not lie flat for stress test. Chest pain is not reproducible. Patient already on aspirin. Add Plavix and metoprolol. Nitroglycerin when necessary. Outpatient follow-up with cardiology.  I am wondering if being off the oxygen was part of the issue on why he had chest pain. 2. Chronic respiratory failure on 2 L of oxygen. Patient states his breathing is at his baseline 3. COPD and asthma. Continue Dulera inhaler 4. 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 Continue oral Lasix. Can consider ACE inhibitor as outpatient if blood pressure allows. Blood pressure will not allow it while here in the hospital too low. 5. Pulmonary nodules. Follows outpatient with Dr. Lennette Bihari 6. Chronic kidney disease stage III 7. GERD on Zantac  DISCHARGE CONDITIONS:   Satisfactory  CONSULTS OBTAINED:  Treatment Team:  Dalia Heading, MD  DRUG ALLERGIES:  No Known Allergies  DISCHARGE MEDICATIONS:   Discharge Medication List as of 10/08/2016  2:56 PM    START taking these medications   Details  clopidogrel (PLAVIX) 75 MG tablet Take 1 tablet (75 mg total) by mouth daily., Starting Thu 10/08/2016, Until Fri 10/08/2017, Print    metoprolol succinate (TOPROL XL) 25 MG 24 hr tablet  Take 1 tablet (25 mg total) by mouth daily., Starting Thu 10/08/2016, Print    nitroGLYCERIN (NITROSTAT) 0.4 MG SL tablet Place 1 tablet (0.4 mg total) under the tongue every 5 (five) minutes as needed for chest pain., Starting Thu 10/08/2016, Print      CONTINUE these medications which have NOT CHANGED   Details  allopurinol (ZYLOPRIM) 100 MG tablet Take 100 mg by mouth daily as needed (for gout). , Until Discontinued, Historical Med    furosemide (LASIX) 40 MG tablet Take 40 mg by mouth daily as needed for fluid. , Until Discontinued, Historical Med    tiotropium (SPIRIVA) 18 MCG inhalation capsule Place 1 capsule (18 mcg total) into inhaler and inhale daily., Starting 04/24/2015, Until Discontinued, Normal    aspirin EC 81 MG EC tablet Take 1 tablet (81 mg total) by mouth daily., Starting 04/24/2015, Until Discontinued, Normal    colchicine 0.6 MG tablet Take 0.6 mg by mouth daily as needed (for gout)., Until Discontinued, Historical Med    mometasone-formoterol (DULERA) 100-5 MCG/ACT AERO Inhale 2 puffs into the lungs 2 (two) times daily., Starting 04/24/2015, Until Discontinued, Normal    ranitidine (ZANTAC) 150 MG tablet Take 1 tablet (150 mg total) by mouth 2 (two) times daily., Starting 12/17/2014, Until Discontinued, Normal         DISCHARGE INSTRUCTIONS:   Follow-up with Dr. Lady Gary one week Follow-up PMD one week  If you experience worsening of your admission symptoms, develop shortness of breath, life threatening emergency, suicidal  or homicidal thoughts you must seek medical attention immediately by calling 911 or calling your MD immediately  if symptoms less severe.  You Must read complete instructions/literature along with all the possible adverse reactions/side effects for all the Medicines you take and that have been prescribed to you. Take any new Medicines after you have completely understood and accept all the possible adverse reactions/side effects.   Please  note  You were cared for by a hospitalist during your hospital stay. If you have any questions about your discharge medications or the care you received while you were in the hospital after you are discharged, you can call the unit and asked to speak with the hospitalist on call if the hospitalist that took care of you is not available. Once you are discharged, your primary care physician will handle any further medical issues. Please note that NO REFILLS for any discharge medications will be authorized once you are discharged, as it is imperative that you return to your primary care physician (or establish a relationship with a primary care physician if you do not have one) for your aftercare needs so that they can reassess your need for medications and monitor your lab values.    Today   CHIEF COMPLAINT:   Chief Complaint  Patient presents with  . Chest Pain    HISTORY OF PRESENT ILLNESS:  Jack Jenkins  is a 83 y.o. male presented with chest pain while shaving   VITAL SIGNS:  Blood pressure (!) 105/49, pulse 78, temperature 98.2 F (36.8 C), temperature source Oral, resp. rate 16, height 5\' 9"  (1.753 m), weight 100 kg (220 lb 8 oz), SpO2 96 %.    PHYSICAL EXAMINATION:  GENERAL:  83 y.o.-year-old patient lying in the bed with no acute distress.  EYES: Pupils equal, round, reactive to light and accommodation. No scleral icterus. Extraocular muscles intact.  HEENT: Head atraumatic, normocephalic. Oropharynx and nasopharynx clear.  NECK:  Supple, no jugular venous distention. No thyroid enlargement, no tenderness.  LUNGS: Decreased breath sounds bilaterally, no wheezing, rales,rhonchi or crepitation. No use of accessory muscles of respiration.  CARDIOVASCULAR: S1, S2 normal. No murmurs, rubs, or gallops.  ABDOMEN: Soft, non-tender, non-distended. Bowel sounds present. No organomegaly or mass.  EXTREMITIES: 2+ edema. No cyanosis, or clubbing.  NEUROLOGIC: Cranial nerves II through XII  are intact. Muscle strength 5/5 in all extremities. Sensation intact. Gait not checked.  PSYCHIATRIC: The patient is alert and oriented x 3.  SKIN: No obvious rash, lesion, or ulcer. Chronic lower extremity discoloration  DATA REVIEW:   CBC  Recent Labs Lab 10/07/16 1040  WBC 4.1  HGB 12.3*  HCT 38.2*  PLT 138*    Chemistries   Recent Labs Lab 10/07/16 1040  NA 147*  K 4.4  CL 103  CO2 39*  GLUCOSE 190*  BUN 23*  CREATININE 1.47*  CALCIUM 9.1  AST 48*  ALT 41  ALKPHOS 92  BILITOT 0.9    Cardiac Enzymes  Recent Labs Lab 10/07/16 2208  TROPONINI <0.03    Microbiology Results  Results for orders placed or performed during the hospital encounter of 04/21/15  MRSA PCR Screening     Status: None   Collection Time: 04/21/15  3:25 PM  Result Value Ref Range Status   MRSA by PCR NEGATIVE NEGATIVE Final    Comment:        The GeneXpert MRSA Assay (FDA approved for NASAL specimens only), is one component of a comprehensive MRSA colonization surveillance program.  It is not intended to diagnose MRSA infection nor to guide or monitor treatment for MRSA infections.     RADIOLOGY:  Dg Chest Port 1 View  Result Date: 10/07/2016 CLINICAL DATA:  Patient to ER for c/o midsternal chest pain that radiated to back. Stated to EMS that pain level was low, but currently has no pain. Patient has h/o CHF EXAM: PORTABLE CHEST 1 VIEW COMPARISON:  10/30/2015 FINDINGS: Patient rotated right. Midline trachea. Mild cardiomegaly. Moderate left hemidiaphragm elevation. No pleural effusion or pneumothorax. Bibasilar volume loss. No congestive failure. No lobar consolidation. Suspect remote left fourth rib trauma, as evidenced by sclerosis, similar. IMPRESSION: Cardiomegaly, without acute disease. Electronically Signed   By: Jeronimo GreavesKyle  Talbot M.D.   On: 10/07/2016 10:28    Management plans discussed with the patient, family and they are in agreement.  CODE STATUS:     Code Status  Orders        Start     Ordered   10/07/16 1536  Full code  Continuous     10/07/16 1535    Code Status History    Date Active Date Inactive Code Status Order ID Comments User Context   04/21/2015  3:16 PM 04/24/2015  6:44 PM Full Code [id number]  Marguarite ArbourSparks, Jeffrey D, MD Inpatient    Advance Directive Documentation     Most Recent Value  Type of Advance Directive  Healthcare Power of Attorney  Pre-existing out of facility DNR order (yellow form or pink MOST form)  -  "MOST" Form in Place?  -      TOTAL TIME TAKING CARE OF THIS PATIENT: 35 minutes.    Alford HighlandWIETING, Milferd Ansell M.D on 10/08/2016 at 3:50 PM  Between 7am to 6pm - Pager - [phone]  After 6pm go to [email or web address] - password [email or web address] [facility]  [employer] Office  [phone]  CC: Primary care physician; Maple HudsonGilbert, Riham Polyakov L Jr., MD

## 2016-10-08 NOTE — Progress Notes (Signed)
Discharged to home with wife.  They have a good grasp of his medications, what they are for, and how to take them.  Follow up appointments have been made

## 2016-10-09 NOTE — Telephone Encounter (Signed)
Transition Care Management Follow-up Telephone Call    Date discharged? 10/08/16  How have you been since you were released from the hospital? Recovering well, pt denies any current symptoms.  Any patient concerns? Pt has not yet started medication prescribed from hospital. Pt is getting those filled today and would like to f/u with Dr. Sullivan LoneGilbert about all the changes and if all the new meds are needed.   Items Reviewed:  Medications reviewed: Yes  Allergies reviewed: Yes  Dietary changes reviewed: N/A  Referrals reviewed: Yes, apt with Dr. Gwen PoundsKowalski on 10/15/16   Functional Questionnaire:  Independent - I Dependent - D    Activities of Daily Living (ADLs):    Personal hygiene - I Dressing - I Eating - I Maintaining continence - I Transferring - I   Independent Activities of Daily Living (iADLs): Basic communication skills - I Transportation - I Meal preparation - I Shopping - I Housework - I Managing medications - I  Managing personal finances - I   Confirmed importance and date/time of follow-up visits scheduled YES  Provider Appointment booked with PCP 10/20/16 @ 10:30 AM.  Confirmed with patient if condition begins to worsen call PCP or go to the ER.  Patient was given the office number and encouraged to call back with question or concerns: YES

## 2016-10-12 NOTE — Telephone Encounter (Signed)
Pt's wife called to let us know that when they come in for pt's hospital f/u on 10/20/16 they will discuss the AWV with Dr. Sullivan LoneGilbert and if they decide to do the AWV they will schedule the appt when they check out. Thanks TNP

## 2016-10-15 DIAGNOSIS — E782 Mixed hyperlipidemia: Secondary | ICD-10-CM | POA: Insufficient documentation

## 2016-10-20 ENCOUNTER — Ambulatory Visit (INDEPENDENT_AMBULATORY_CARE_PROVIDER_SITE_OTHER): Payer: Medicare Other | Admitting: Family Medicine

## 2016-10-20 ENCOUNTER — Encounter: Payer: Self-pay | Admitting: Family Medicine

## 2016-10-20 VITALS — BP 118/58 | HR 58 | Temp 97.8°F | Resp 16 | Wt 223.0 lb

## 2016-10-20 DIAGNOSIS — J441 Chronic obstructive pulmonary disease with (acute) exacerbation: Secondary | ICD-10-CM

## 2016-10-20 DIAGNOSIS — N183 Chronic kidney disease, stage 3 unspecified: Secondary | ICD-10-CM

## 2016-10-20 DIAGNOSIS — I509 Heart failure, unspecified: Secondary | ICD-10-CM

## 2016-10-20 MED ORDER — ALLOPURINOL 100 MG PO TABS: 100.0000 mg | ORAL_TABLET | Freq: Every day | ORAL | 3 refills | Status: DC | PRN

## 2016-10-20 MED ORDER — METOPROLOL SUCCINATE ER 25 MG PO TB24: 25.0000 mg | ORAL_TABLET | Freq: Every day | ORAL | 3 refills | Status: DC

## 2016-10-20 MED ORDER — FUROSEMIDE 40 MG PO TABS: 40.0000 mg | ORAL_TABLET | Freq: Every day | ORAL | 3 refills | Status: DC | PRN

## 2016-10-20 NOTE — Progress Notes (Signed)
Patient: Jack Jenkins Male    DOB: 07-Jul-1930   83 y.o.   MRN: [id number] Visit Date: 10/20/2016  Today's Provider: Megan Mansichard Gilbert Jr, MD   Chief Complaint  Patient presents with  . Hospitalization Follow-up   Subjective:    HPI  Follow up Hospitalization  Patient was [facility] to [facility] on 10/07/2016 and discharged on 10/08/2016 . He was treated for chest pain and congestive heart failure. Treatment for this included adding Plavix and Metoprolol. He reports good compliance with treatment. He reports this condition is Improved.  Patient saw Dr. Gwen PoundsKowalski on 10/15/2016 and patient was to continue the current regimen for his hypertension for now. It was recommended that the patient be enrolled in cardiac rehab. No stress test needed at this time.        No Known Allergies   Current Outpatient Prescriptions:  .  allopurinol (ZYLOPRIM) 100 MG tablet, Take 100 mg by mouth daily as needed (for gout). , Disp: , Rfl:  .  colchicine 0.6 MG tablet, Take 0.6 mg by mouth daily as needed (for gout)., Disp: , Rfl:  .  furosemide (LASIX) 40 MG tablet, Take 40 mg by mouth daily as needed for fluid. , Disp: , Rfl:  .  metoprolol succinate (TOPROL XL) 25 MG 24 hr tablet, Take 1 tablet (25 mg total) by mouth daily., Disp: 30 tablet, Rfl: 0 .  tiotropium (SPIRIVA) 18 MCG inhalation capsule, Place 1 capsule (18 mcg total) into inhaler and inhale daily., Disp: 30 capsule, Rfl: 12 .  aspirin EC 81 MG EC tablet, Take 1 tablet (81 mg total) by mouth daily. (Patient not taking: Reported on 10/30/2015), Disp: 30 tablet, Rfl: 0 .  clopidogrel (PLAVIX) 75 MG tablet, Take 1 tablet (75 mg total) by mouth daily. (Patient not taking: Reported on 10/09/2016), Disp: 30 tablet, Rfl: 0 .  mometasone-formoterol (DULERA) 100-5 MCG/ACT AERO, Inhale 2 puffs into the lungs 2 (two) times daily., Disp: 1 Inhaler, Rfl: 1 .  nitroGLYCERIN (NITROSTAT) 0.4 MG SL tablet, Place 1 tablet (0.4 mg total) under the tongue every 5  (five) minutes as needed for chest pain. (Patient not taking: Reported on 10/09/2016), Disp: 30 tablet, Rfl: 0 .  ranitidine (ZANTAC) 150 MG tablet, Take 1 tablet (150 mg total) by mouth 2 (two) times daily. (Patient taking differently: Take 150 mg by mouth 2 (two) times daily. ), Disp: 60 tablet, Rfl: 5  Review of Systems  Constitutional: Negative.   HENT: Negative.   Eyes: Negative.   Respiratory: Positive for shortness of breath.        With exertion  Cardiovascular: Negative for chest pain, palpitations and leg swelling.  Gastrointestinal: Negative.   Endocrine: Negative.   Musculoskeletal: Negative.   Allergic/Immunologic: Negative.   Neurological: Negative.   Hematological: Negative.   Psychiatric/Behavioral: Negative.     Social History  Substance Use Topics  . Smoking status: Never Smoker  . Smokeless tobacco: Never Used  . Alcohol use No   Objective:   BP (!) 118/58 (BP Location: Right Arm, Patient Position: Sitting, Cuff Size: Normal)   Pulse (!) 58   Temp 97.8 F (36.6 C)   Resp 16   Wt 223 lb (101.2 kg)   SpO2 99% Comment: with 2L of O2  BMI 32.93 kg/m  Vitals:   10/20/16 1049  BP: (!) 118/58  Pulse: (!) 58  Resp: 16  Temp: 97.8 F (36.6 C)  SpO2: 99%  Weight: 223 lb (101.2 kg)  Physical Exam  Constitutional: He is oriented to person, place, and time. He appears well-developed and well-nourished.  WNWDWM NAD. Wearing O2.  HENT:  Head: Normocephalic and atraumatic.  Right Ear: External ear normal.  Left Ear: External ear normal.  Nose: Nose normal.  Eyes: Conjunctivae are normal. No scleral icterus.  Neck: No thyromegaly present.  Cardiovascular: Normal rate, regular rhythm and normal heart sounds.   Pulmonary/Chest: Effort normal and breath sounds normal.  Abdominal: Soft.  Neurological: He is alert and oriented to person, place, and time.  Skin: Skin is warm and dry.  Psychiatric: He has a normal mood and affect. His behavior is normal.  Judgment and thought content normal.        Assessment & Plan:     Chest Pain Resolved.Off of Plavix. Chronic CHF  COPD On O2.      Richard Wendelyn Breslow, MD  [facility]

## 2016-11-12 ENCOUNTER — Encounter: Payer: Self-pay | Admitting: Family Medicine

## 2016-11-12 ENCOUNTER — Ambulatory Visit (INDEPENDENT_AMBULATORY_CARE_PROVIDER_SITE_OTHER): Payer: Medicare Other | Admitting: Family Medicine

## 2016-11-12 VITALS — BP 108/54 | HR 105 | Temp 98.1°F | Wt 224.8 lb

## 2016-11-12 DIAGNOSIS — J4541 Moderate persistent asthma with (acute) exacerbation: Secondary | ICD-10-CM

## 2016-11-12 DIAGNOSIS — J441 Chronic obstructive pulmonary disease with (acute) exacerbation: Secondary | ICD-10-CM | POA: Diagnosis not present

## 2016-11-12 MED ORDER — DM-GUAIFENESIN ER 30-600 MG PO TB12: 1.0000 | ORAL_TABLET | Freq: Two times a day (BID) | ORAL | 0 refills | Status: DC

## 2016-11-12 MED ORDER — DOXYCYCLINE HYCLATE 100 MG PO TABS: 100.0000 mg | ORAL_TABLET | Freq: Two times a day (BID) | ORAL | 0 refills | Status: DC

## 2016-11-12 MED ORDER — ALBUTEROL SULFATE (2.5 MG/3ML) 0.083% IN NEBU: 2.5000 mg | INHALATION_SOLUTION | Freq: Once | RESPIRATORY_TRACT | Status: DC

## 2016-11-12 NOTE — Progress Notes (Signed)
Patient: Jack Jenkins Male    DOB: Oct 05, 1930   83 y.o.   MRN: [id number] Visit Date: 11/12/2016  Today's Provider: Dortha Kern, PA   Chief Complaint  Patient presents with  . Cough  . Nasal Congestion   Subjective:    URI   This is a new problem. The current episode started yesterday. The problem has been unchanged. There has been no fever. Associated symptoms include congestion, coughing, nausea, a sore throat and vomiting (clear phlegm). He has tried nothing for the symptoms.   Patient Active Problem List   Diagnosis Date Noted  . Hyperlipidemia, mixed 10/15/2016  . Chest pain 10/07/2016  . Acute respiratory failure (HCC) 04/21/2015  . Dyspnea 04/21/2015  . COPD exacerbation (HCC) 04/21/2015  . Abnormal chest CT 04/21/2015  . CKD (chronic kidney disease) 12/17/2014  . Photokeratitis 11/06/2014  . Allergic rhinitis 11/06/2014  . AB (asthmatic bronchitis) 11/06/2014  . CCF (congestive cardiac failure) (HCC) 11/06/2014  . Colon polyp 11/06/2014  . Urinary system disease 11/06/2014  . ED (erectile dysfunction) of organic origin 11/06/2014  . Acid reflux 11/06/2014  . Acute gouty arthropathy 11/06/2014  . Gout 11/06/2014  . Arthritis, degenerative 11/06/2014  . Fast heart beat 11/06/2014   Past Surgical History:  Procedure Laterality Date  . BACK SURGERY    . CATARACT EXTRACTION Bilateral   . CHOLECYSTECTOMY    . HERNIA REPAIR    . LAMINECTOMY    . POLYPECTOMY     colon  . UPPER GI ENDOSCOPY  01/15/04   Family History  Problem Relation Age of Onset  . Heart failure Mother   . Heart attack Father   . CAD Sister   . Lung cancer Brother   . CAD Sister    No Known Allergies   Previous Medications   ALLOPURINOL (ZYLOPRIM) 100 MG TABLET    Take 1 tablet (100 mg total) by mouth daily as needed (for gout).   ASPIRIN EC 81 MG EC TABLET    Take 1 tablet (81 mg total) by mouth daily.   CLOPIDOGREL (PLAVIX) 75 MG TABLET    Take 1 tablet (75 mg total) by mouth daily.     COLCHICINE 0.6 MG TABLET    Take 0.6 mg by mouth daily as needed (for gout).   FUROSEMIDE (LASIX) 40 MG TABLET    Take 1 tablet (40 mg total) by mouth daily as needed for fluid.   METOPROLOL SUCCINATE (TOPROL XL) 25 MG 24 HR TABLET    Take 1 tablet (25 mg total) by mouth daily.   MOMETASONE-FORMOTEROL (DULERA) 100-5 MCG/ACT AERO    Inhale 2 puffs into the lungs 2 (two) times daily.   NITROGLYCERIN (NITROSTAT) 0.4 MG SL TABLET    Place 1 tablet (0.4 mg total) under the tongue every 5 (five) minutes as needed for chest pain.   RANITIDINE (ZANTAC) 150 MG TABLET    Take 1 tablet (150 mg total) by mouth 2 (two) times daily.   TIOTROPIUM (SPIRIVA) 18 MCG INHALATION CAPSULE    Place 1 capsule (18 mcg total) into inhaler and inhale daily.    Review of Systems  Constitutional: Negative.   HENT: Positive for congestion and sore throat.   Respiratory: Positive for cough.   Cardiovascular: Negative.   Gastrointestinal: Positive for nausea and vomiting (clear phlegm).    Social History  Substance Use Topics  . Smoking status: Never Smoker  . Smokeless tobacco: Never Used  . Alcohol use No   Objective:  BP (!) 108/54 (BP Location: Right Arm, Patient Position: Sitting, Cuff Size: Normal)   Pulse (!) 105   Temp 98.1 F (36.7 C) (Oral)   Wt 224 lb 12.8 oz (102 kg)   SpO2 92% Comment: with 2 L O2  BMI 33.20 kg/m   Physical Exam  Constitutional: He is oriented to person, place, and time. He appears well-developed and well-nourished. No distress.  HENT:  Head: Normocephalic and atraumatic.  Right Ear: Hearing normal.  Left Ear: Hearing normal.  Nose: Nose normal.  Eyes: Conjunctivae and lids are normal. Right eye exhibits no discharge. Left eye exhibits no discharge. No scleral icterus.  Neck: Neck supple.  Cardiovascular: Normal rate and regular rhythm.   Pulmonary/Chest: Effort normal. He has wheezes.  Dyspnea and wheezing with very distant breath sounds.  Abdominal: Soft. Bowel sounds  are normal.  Musculoskeletal: Normal range of motion.  Lymphadenopathy:    He has no cervical adenopathy.  Neurological: He is alert and oriented to person, place, and time.  Skin: Skin is intact. No lesion and no rash noted.  Psychiatric: He has a normal mood and affect. His speech is normal and behavior is normal. Thought content normal.      Assessment & Plan:     1. Moderate persistent asthmatic bronchitis with acute exacerbation Onset over the past week with son and grandsons having the same symptoms after a beach trip with them. No fever. Sputum primarily clear to milky. No URI symptoms. Still using Dulera and Spiriva regularly with oxygen by nasal cannula at 2 LPM 24 hours a day. Pulse oximetry 92% on oxygen today. After nebulizer treatment, wheezing improved and lessening of dyspnea. Treat with Doxycycline and Mucinex-DM for cough and congestion. Recheck on 10 days if no better or symptoms return. - albuterol (PROVENTIL) (2.5 MG/3ML) 0.083% nebulizer solution 2.5 mg; Take 3 mLs (2.5 mg total) by nebulization once. - doxycycline (VIBRA-TABS) 100 MG tablet; Take 1 tablet (100 mg total) by mouth 2 (two) times daily.  Dispense: 20 tablet; Refill: 0 - dextromethorphan-guaiFENesin (MUCINEX DM) 30-600 MG 12hr tablet; Take 1 tablet by mouth 2 (two) times daily.  Dispense: 20 tablet; Refill: 0  2. COPD exacerbation (HCC) Recent flare of sputum production and wheezing. Nebulizer treatment helped - doxycycline (VIBRA-TABS) 100 MG tablet; Take 1 tablet (100 mg total) by mouth 2 (two) times daily.  Dispense: 20 tablet; Refill: 0

## 2016-11-23 ENCOUNTER — Ambulatory Visit: Payer: Medicare Other | Admitting: Family Medicine

## 2016-11-24 DIAGNOSIS — I5032 Chronic diastolic (congestive) heart failure: Secondary | ICD-10-CM | POA: Insufficient documentation

## 2016-11-24 DIAGNOSIS — I10 Essential (primary) hypertension: Secondary | ICD-10-CM | POA: Insufficient documentation

## 2016-12-31 ENCOUNTER — Other Ambulatory Visit: Payer: Self-pay | Admitting: Internal Medicine

## 2016-12-31 DIAGNOSIS — R0602 Shortness of breath: Secondary | ICD-10-CM

## 2017-01-12 ENCOUNTER — Ambulatory Visit
Admission: RE | Admit: 2017-01-12 | Discharge: 2017-01-12 | Disposition: A | Discharge status: Home / Self Care | Payer: Medicare Other | Source: Ambulatory Visit | Attending: Internal Medicine | Admitting: Internal Medicine

## 2017-01-12 DIAGNOSIS — R0602 Shortness of breath: Secondary | ICD-10-CM | POA: Diagnosis present

## 2017-01-12 DIAGNOSIS — J40 Bronchitis, not specified as acute or chronic: Secondary | ICD-10-CM | POA: Diagnosis not present

## 2017-01-12 DIAGNOSIS — I517 Cardiomegaly: Secondary | ICD-10-CM | POA: Insufficient documentation

## 2017-01-12 DIAGNOSIS — I700 Atherosclerosis of aorta: Secondary | ICD-10-CM | POA: Insufficient documentation

## 2017-01-21 ENCOUNTER — Other Ambulatory Visit: Payer: Self-pay | Admitting: Internal Medicine

## 2017-01-21 DIAGNOSIS — R911 Solitary pulmonary nodule: Secondary | ICD-10-CM

## 2017-01-25 ENCOUNTER — Ambulatory Visit (HOSPITAL_COMMUNITY): Payer: Medicare Other

## 2017-01-25 ENCOUNTER — Ambulatory Visit
Admission: RE | Admit: 2017-01-25 | Discharge: 2017-01-25 | Disposition: A | Discharge status: Home / Self Care | Payer: Medicare Other | Source: Ambulatory Visit | Attending: Internal Medicine | Admitting: Internal Medicine

## 2017-01-25 DIAGNOSIS — I700 Atherosclerosis of aorta: Secondary | ICD-10-CM | POA: Diagnosis not present

## 2017-01-25 DIAGNOSIS — J9811 Atelectasis: Secondary | ICD-10-CM | POA: Insufficient documentation

## 2017-01-25 DIAGNOSIS — R911 Solitary pulmonary nodule: Secondary | ICD-10-CM | POA: Diagnosis present

## 2017-01-25 LAB — POCT I-STAT CREATININE: Creatinine, Ser: 1.7 mg/dL — ABNORMAL HIGH (ref 0.61–1.24)

## 2017-01-25 MED ORDER — IOPAMIDOL (ISOVUE-300) INJECTION 61%: 75.0000 mL | Freq: Once | INTRAVENOUS | Status: DC | PRN

## 2017-01-25 MED ORDER — IOPAMIDOL (ISOVUE-300) INJECTION 61%
60.0000 mL | Freq: Once | INTRAVENOUS | Status: AC | PRN
  Administered 2017-01-25: 60 mL via INTRAVENOUS

## 2017-01-27 ENCOUNTER — Ambulatory Visit: Payer: Medicare Other

## 2017-02-01 ENCOUNTER — Telehealth: Payer: Self-pay | Admitting: Family Medicine

## 2017-02-09 ENCOUNTER — Ambulatory Visit
Admission: RE | Admit: 2017-02-09 | Discharge: 2017-02-09 | Disposition: A | Discharge status: Home / Self Care | Payer: Medicare Other | Source: Ambulatory Visit | Attending: Internal Medicine | Admitting: Internal Medicine

## 2017-02-09 ENCOUNTER — Ambulatory Visit: Payer: Medicare Other

## 2017-02-09 ENCOUNTER — Encounter
Admission: RE | Disposition: A | Discharge status: Home / Self Care | Payer: Self-pay | Source: Ambulatory Visit | Attending: Internal Medicine

## 2017-02-09 ENCOUNTER — Encounter: Payer: Self-pay | Admitting: *Deleted

## 2017-02-09 DIAGNOSIS — K219 Gastro-esophageal reflux disease without esophagitis: Secondary | ICD-10-CM | POA: Insufficient documentation

## 2017-02-09 DIAGNOSIS — I509 Heart failure, unspecified: Secondary | ICD-10-CM | POA: Insufficient documentation

## 2017-02-09 DIAGNOSIS — J849 Interstitial pulmonary disease, unspecified: Secondary | ICD-10-CM | POA: Diagnosis not present

## 2017-02-09 DIAGNOSIS — G20 Parkinson's disease: Secondary | ICD-10-CM | POA: Insufficient documentation

## 2017-02-09 DIAGNOSIS — I252 Old myocardial infarction: Secondary | ICD-10-CM | POA: Diagnosis not present

## 2017-02-09 DIAGNOSIS — Z9889 Other specified postprocedural states: Secondary | ICD-10-CM

## 2017-02-09 DIAGNOSIS — Z79899 Other long term (current) drug therapy: Secondary | ICD-10-CM | POA: Insufficient documentation

## 2017-02-09 DIAGNOSIS — J449 Chronic obstructive pulmonary disease, unspecified: Secondary | ICD-10-CM | POA: Insufficient documentation

## 2017-02-09 HISTORY — PX: FLEXIBLE BRONCHOSCOPY: SHX5094

## 2017-02-09 SURGERY — BRONCHOSCOPY, FLEXIBLE
Anesthesia: Moderate Sedation

## 2017-02-09 MED ORDER — BUTAMBEN-TETRACAINE-BENZOCAINE 2-2-14 % EX AERO
1.0000 | INHALATION_SPRAY | Freq: Once | CUTANEOUS | Status: DC
  Filled 2017-02-09: qty 20

## 2017-02-09 MED ORDER — MIDAZOLAM HCL 5 MG/5ML IJ SOLN
INTRAMUSCULAR | Status: AC | PRN
  Administered 2017-02-09 (×2): 1 mg via INTRAVENOUS

## 2017-02-09 MED ORDER — FENTANYL CITRATE (PF) 100 MCG/2ML IJ SOLN
INTRAMUSCULAR | Status: AC
  Filled 2017-02-09: qty 4

## 2017-02-09 MED ORDER — MIDAZOLAM HCL 5 MG/5ML IJ SOLN
INTRAMUSCULAR | Status: AC
  Filled 2017-02-09: qty 10

## 2017-02-09 MED ORDER — PHENYLEPHRINE HCL 0.25 % NA SOLN
1.0000 | Freq: Four times a day (QID) | NASAL | Status: DC | PRN
  Filled 2017-02-09: qty 15

## 2017-02-09 MED ORDER — LIDOCAINE HCL 2 % EX GEL: 1.0000 "application " | Freq: Once | CUTANEOUS | Status: DC

## 2017-02-09 MED ORDER — FENTANYL CITRATE (PF) 100 MCG/2ML IJ SOLN
INTRAMUSCULAR | Status: AC | PRN
  Administered 2017-02-09: 50 ug via INTRAVENOUS

## 2017-02-09 NOTE — Discharge Instructions (Signed)
Flexible Bronchoscopy, Care After °This sheet gives you information about how to care for yourself after your procedure. Your health care provider may also give you more specific instructions. If you have problems or questions, contact your health care provider. °What can I expect after the procedure? °After the procedure, it is common to have the following symptoms for 24-48 hours: °· A cough that is worse than it was before the procedure. °· A low-grade fever. °· A sore throat or hoarse voice. °· Small streaks of blood in the mucus from your lungs (sputum), if tissue samples were removed (biopsy). ° °Follow these instructions at home: °Eating and drinking °· Do not eat or drink anything (including water) for 2 hours after your procedure, or until your numbing medicine (local anesthetic) has worn off. Having a numb throat increases your risk of burning yourself or choking. °· After your numbness is gone and your cough and gag reflexes have returned, you may start eating only soft foods and slowly drinking liquids. °· The day after the procedure, return to your normal diet. °Driving °· Do not drive for 24 hours if you were given a medicine to help you relax (sedative). °· Do not drive or use heavy machinery while taking prescription pain medicine. °General instructions °· Take over-the-counter and prescription medicines only as told by your health care provider. °· Return to your normal activities as told by your health care provider. Ask your health care provider what activities are safe for you. °· Do not use any products that contain nicotine or tobacco, such as cigarettes and e-cigarettes. If you need help quitting, ask your health care provider. °· Keep all follow-up visits as told by your health care provider. This is important, especially if you had a biopsy taken. °Get help right away if: °· You have shortness of breath that gets worse. °· You become light-headed or feel like you might faint. °· You have  chest pain. °· You cough up more than a small amount of blood. °· The amount of blood you cough up increases. °Summary °· Common symptoms in the 24-48 hours following a flexible bronchoscopy include cough, low-grade fever, sore throat or hoarse voice, and blood-streaked mucus from the lungs (if you had a biopsy). °· Do not eat or drink anything (including water) for 2 hours after your procedure, or until your local anesthetic has worn off. You can return to your normal diet the day after the procedure. °· Get help right away if you develop worsening shortness of breath, have chest pain, become light-headed, or cough up more than a small amount of blood. °This information is not intended to replace advice given to you by your health care provider. Make sure you discuss any questions you have with your health care provider. °Document Released: 11/14/2004 Document Revised: 05/15/2016 Document Reviewed: 05/15/2016 °Elsevier Interactive Patient Education © 2017 Else[employer] Inc. °Moderate Conscious Sedation, Adult, Care After °These instructions provide you with information about caring for yourself after your procedure. Your health care provider may also give you more specific instructions. Your treatment has been planned according to current medical practices, but problems sometimes occur. Call your health care provider if you have any problems or questions after your procedure. °What can I expect after the procedure? °After your procedure, it is common: °· To feel sleepy for several hours. °· To feel clumsy and have poor balance for several hours. °· To have poor judgment for several hours. °· To vomit if you eat too soon. ° °  Follow these instructions at home: For at least 24 hours after the procedure:   Do not: ? Participate in activities where you could fall or become injured. ? Drive. ? Use heavy machinery. ? Drink alcohol. ? Take sleeping pills or medicines that cause drowsiness. ? Make important decisions  or sign legal documents. ? Take care of children on your own.  Rest. Eating and drinking  Follow the diet recommended by your health care provider.  If you vomit: ? Drink water, juice, or soup when you can drink without vomiting. ? Make sure you have little or no nausea before eating solid foods. General instructions  Have a responsible adult stay with you until you are awake and alert.  Take over-the-counter and prescription medicines only as told by your health care provider.  If you smoke, do not smoke without supervision.  Keep all follow-up visits as told by your health care provider. This is important. Contact a health care provider if:  You keep feeling nauseous or you keep vomiting.  You feel light-headed.  You develop a rash.  You have a fever. Get help right away if:  You have trouble breathing. This information is not intended to replace advice given to you by your health care provider. Make sure you discuss any questions you have with your health care provider. Document Released: 02/15/2013 Document Revised: 09/30/2015 Document Reviewed: 08/17/2015 Elsevier Interactive Patient Education  [employer].

## 2017-02-09 NOTE — Op Note (Signed)
[facility] Patient Name: Jack Jenkins Procedure Date: 02/09/2017 11:33 AM MRN: [id number] Account #: [id number] Date of Birth: April 20, 1931 Admit Type: Inpatient Age: 83 Room: [facility] PROCEDURE RM 01 Gender: Male Note Status: Finalized Attending MD: Allyne Gee , MD Procedure:         Bronchoscopy Indications:       Interstitial lung disease, Recurrent bilateral pneumonia Providers:         Allyne Gee, MD Referring MD:       Medicines:         Midazolam 2 mg IV, Fentanyl 50 mcg IV Complications:     No immediate complications Procedure:         Pre-Anesthesia Assessment:                    - The risks and benefits of the procedure and the sedation                     options and risks were discussed with the patient. All                     questions were answered and informed consent was obtained.                    - Pre-procedure physical examination revealed no                     contraindications to sedation.                    - ASA Grade Assessment: IV - A patient with severe                     systemic disease that is a constant threat to life.                    - After reviewing the risks and benefits, the patient was                     deemed in satisfactory condition to undergo the procedure.                    - The anesthesia plan was to use moderate                     sedation/analgesia.                    - Immediately prior to administration of medications, the                     patient was re-assessed for adequacy to receive sedatives.                    - The heart rate, respiratory rate, oxygen saturations,                     blood pressure, adequacy of pulmonary ventilation, and                     response to care were monitored throughout the procedure.                    - The physical status of the patient was re-assessed after  the procedure.                    After obtaining informed consent, the bronchoscope  was                     passed under direct vision. Throughout the procedure, the                     patient's blood pressure, pulse, and oxygen saturations                     were monitored continuously. The procedure was                     accomplished without difficulty. The patient tolerated the                     procedure fairly well. The total duration of the procedure                     was 15 minutes. the Bronchoscope Olympus [id number]                     [id number] was introduced through the right nostril and                     advanced to the tracheobronchial tree of both lungs. Findings:      The nasopharynx/oropharynx appears normal. The larynx appears normal.       The vocal cords appear normal. The subglottic space is normal. The       trachea is of normal caliber. The carina is sharp. The tracheobronchial       tree of the right lung was examined to at least the first subsegmental       level. Bronchial mucosa and anatomy in the right lung are normal; there       are no endobronchial lesions, and no secretions.      Left Lung Abnormalities: Erythema was found in the left mainstem       bronchus and in the left lower lobe. Bleeding was induced by the       procedure in the left lower lobe. Estimated blood loss: minimal. The       bleeding stopped spontaneously BAL was performed in the left lower lobe,       in the right middle lobe and in the right lower lobe of the lung and       sent for cell count, bacterial culture, viral smears & culture, and       fungal & AFB analysis and cytology, aerobic culture, AFB analysis &       culture, Chlamydia, Legionella (DFA & culture), Mycoplasma, Nocardia and       fungal analysis. 150 mL of fluid were instilled. 70 mL were returned.       The return was blood-tinged. There were no mucoid plugs in the return       fluid. Multiple specimens were obtained, and each sent for analysis. Impression:        - Interstitial lung disease                     - Recurrent bilateral pneumonia                    - The right lung was normal.                    -  Erythema was present in the left mainstem bronchus and                     in the left lower lobe.                    - Bleeding was induced by the procedure in the left lower                     lobe.                    - Bronchoalveolar lavage was performed.                    - The examination was normal. Recommendation:    - Await BAL, culture, cytology and washing results.                    - Await BAL, culture, cytology and washing results. Devona Konig, MD Allyne Gee, MD 02/09/2017 11:55:59 AM This report has been signed electronically. Number of Addenda: 0 Note Initiated On: 02/09/2017 11:33 AM      [facility]

## 2017-02-09 NOTE — Progress Notes (Signed)
Pt sitting up in chair at this time in NAD.  Pt drinking juice at this time w/o difficulty. Pt on oxygen via [place] 4L.  Dr. Welton Flakes at bedside earlier to discuss procedure and follow up.  Pt' family at bedside.  Discharge and follow up instructions reviewed with pt and family.  Pt and family verbalize understanding.

## 2017-02-10 ENCOUNTER — Encounter: Payer: Self-pay | Admitting: Internal Medicine

## 2017-02-10 LAB — ACID FAST SMEAR (AFB): ACID FAST SMEAR - AFSCU2: NEGATIVE

## 2017-02-13 LAB — CULTURE, RESPIRATORY W GRAM STAIN

## 2017-02-13 LAB — CULTURE, RESPIRATORY: CULTURE: NORMAL

## 2017-03-01 ENCOUNTER — Telehealth: Payer: Self-pay

## 2017-03-01 NOTE — Telephone Encounter (Signed)
FYI...  Pt's wife come in to the clinic today for an appointment for herself.  While she is here she mentioned that her husband's O2 was 66% after getting out of the shower.  I explained to her that his O2 should not be that low and that he would need to be seen in the ER.  She then explained to me that "his oxygen always drops that low when he takes a shower or walks around.  But once he sits down for a little while his O2 will go back up."    I advised her Jack Jenkins really needed to be evaluated at the ER.  She said the she will "First be seen for her appointment then go home and decide."  She then stated the [name]'s "pulmnologist knows about this and it doesn't seem to concern him".  I had her call while she was here Jack Jenkins says his O2 was back at 94%.   Thanks,   -Vernona RiegerLaura

## 2017-03-02 LAB — CULTURE, FUNGUS WITHOUT SMEAR

## 2017-03-05 LAB — AFB ORGANISM ID BY DNA PROBE
M AVIUM COMPLEX: POSITIVE — AB
M tuberculosis complex: NEGATIVE

## 2017-03-05 LAB — MAC SUSCEPTIBILITY BROTH
Amikacin: 16
Ciprofloxacin: >16
Clarithromycin: 1
Linezolid: 32

## 2017-03-05 LAB — ACID FAST CULTURE WITH REFLEXED SENSITIVITIES: ACID FAST CULTURE - AFSCU3: POSITIVE — AB

## 2017-03-05 LAB — ACID FAST CULTURE WITH REFLEXED SENSITIVITIES (MYCOBACTERIA)

## 2017-04-01 ENCOUNTER — Encounter: Payer: Self-pay | Admitting: Emergency Medicine

## 2017-04-01 ENCOUNTER — Inpatient Hospital Stay: Payer: Medicare Other

## 2017-04-01 ENCOUNTER — Emergency Department: Payer: Medicare Other

## 2017-04-01 ENCOUNTER — Inpatient Hospital Stay
Admission: EM | Admit: 2017-04-01 | Discharge: 2017-04-07 | DRG: 177 | Disposition: A | Discharge status: Home Health | Payer: Medicare Other | Attending: Internal Medicine | Admitting: Internal Medicine

## 2017-04-01 ENCOUNTER — Other Ambulatory Visit: Payer: Self-pay

## 2017-04-01 DIAGNOSIS — E872 Acidosis: Secondary | ICD-10-CM | POA: Diagnosis present

## 2017-04-01 DIAGNOSIS — I5022 Chronic systolic (congestive) heart failure: Secondary | ICD-10-CM | POA: Diagnosis present

## 2017-04-01 DIAGNOSIS — A310 Pulmonary mycobacterial infection: Principal | ICD-10-CM | POA: Diagnosis present

## 2017-04-01 DIAGNOSIS — J440 Chronic obstructive pulmonary disease with acute lower respiratory infection: Secondary | ICD-10-CM | POA: Diagnosis present

## 2017-04-01 DIAGNOSIS — Z79899 Other long term (current) drug therapy: Secondary | ICD-10-CM | POA: Diagnosis not present

## 2017-04-01 DIAGNOSIS — J849 Interstitial pulmonary disease, unspecified: Secondary | ICD-10-CM | POA: Diagnosis present

## 2017-04-01 DIAGNOSIS — J9622 Acute and chronic respiratory failure with hypercapnia: Secondary | ICD-10-CM | POA: Diagnosis present

## 2017-04-01 DIAGNOSIS — R0600 Dyspnea, unspecified: Secondary | ICD-10-CM

## 2017-04-01 DIAGNOSIS — G9341 Metabolic encephalopathy: Secondary | ICD-10-CM | POA: Diagnosis present

## 2017-04-01 DIAGNOSIS — Z9981 Dependence on supplemental oxygen: Secondary | ICD-10-CM | POA: Diagnosis not present

## 2017-04-01 DIAGNOSIS — J441 Chronic obstructive pulmonary disease with (acute) exacerbation: Secondary | ICD-10-CM | POA: Diagnosis present

## 2017-04-01 DIAGNOSIS — R451 Restlessness and agitation: Secondary | ICD-10-CM | POA: Diagnosis not present

## 2017-04-01 DIAGNOSIS — M109 Gout, unspecified: Secondary | ICD-10-CM

## 2017-04-01 DIAGNOSIS — E875 Hyperkalemia: Secondary | ICD-10-CM | POA: Diagnosis present

## 2017-04-01 DIAGNOSIS — J4541 Moderate persistent asthma with (acute) exacerbation: Secondary | ICD-10-CM | POA: Diagnosis present

## 2017-04-01 DIAGNOSIS — Z7902 Long term (current) use of antithrombotics/antiplatelets: Secondary | ICD-10-CM

## 2017-04-01 DIAGNOSIS — J9621 Acute and chronic respiratory failure with hypoxia: Secondary | ICD-10-CM | POA: Diagnosis present

## 2017-04-01 DIAGNOSIS — Z7982 Long term (current) use of aspirin: Secondary | ICD-10-CM | POA: Diagnosis not present

## 2017-04-01 DIAGNOSIS — D696 Thrombocytopenia, unspecified: Secondary | ICD-10-CM | POA: Diagnosis not present

## 2017-04-01 DIAGNOSIS — N183 Chronic kidney disease, stage 3 (moderate): Secondary | ICD-10-CM | POA: Diagnosis present

## 2017-04-01 DIAGNOSIS — R410 Disorientation, unspecified: Secondary | ICD-10-CM

## 2017-04-01 DIAGNOSIS — R0902 Hypoxemia: Secondary | ICD-10-CM

## 2017-04-01 DIAGNOSIS — J449 Chronic obstructive pulmonary disease, unspecified: Secondary | ICD-10-CM | POA: Diagnosis present

## 2017-04-01 DIAGNOSIS — R0602 Shortness of breath: Secondary | ICD-10-CM

## 2017-04-01 HISTORY — DX: Disorder of kidney and ureter, unspecified: N28.9

## 2017-04-01 HISTORY — DX: Chronic obstructive pulmonary disease, unspecified: J44.9

## 2017-04-01 HISTORY — DX: Gout, unspecified: M10.9

## 2017-04-01 LAB — CBC WITH DIFFERENTIAL/PLATELET
BASOS ABS: 0 10*3/uL (ref 0–0.1)
BASOS PCT: 0 %
EOS ABS: 0.2 10*3/uL (ref 0–0.7)
Eosinophils Relative: 6 %
HCT: 38.3 % — ABNORMAL LOW (ref 40.0–52.0)
HEMOGLOBIN: 12.1 g/dL — AB (ref 13.0–18.0)
LYMPHS ABS: 1.1 10*3/uL (ref 1.0–3.6)
Lymphocytes Relative: 29 %
MCH: 31 pg (ref 26.0–34.0)
MCHC: 31.6 g/dL — ABNORMAL LOW (ref 32.0–36.0)
MCV: 98 fL (ref 80.0–100.0)
Monocytes Absolute: 0.5 10*3/uL (ref 0.2–1.0)
Monocytes Relative: 12 %
NEUTROS PCT: 53 %
Neutro Abs: 2 10*3/uL (ref 1.4–6.5)
PLATELETS: 126 10*3/uL — AB (ref 150–440)
RBC: 3.91 MIL/uL — AB (ref 4.40–5.90)
RDW: 15.1 % — ABNORMAL HIGH (ref 11.5–14.5)
WBC: 3.8 10*3/uL (ref 3.8–10.6)

## 2017-04-01 LAB — COMPREHENSIVE METABOLIC PANEL
ALT: 14 U/L — AB (ref 17–63)
AST: 28 U/L (ref 15–41)
Albumin: 3.6 g/dL (ref 3.5–5.0)
Alkaline Phosphatase: 81 U/L (ref 38–126)
Anion gap: 10 (ref 5–15)
BUN: 16 mg/dL (ref 6–20)
CHLORIDE: 98 mmol/L — AB (ref 101–111)
CO2: 39 mmol/L — AB (ref 22–32)
CREATININE: 1.31 mg/dL — AB (ref 0.61–1.24)
Calcium: 9.2 mg/dL (ref 8.9–10.3)
GFR calc non Af Amer: 48 mL/min — ABNORMAL LOW (ref >60)
GFR, EST AFRICAN AMERICAN: 56 mL/min — AB (ref >60)
Glucose, Bld: 105 mg/dL — ABNORMAL HIGH (ref 65–99)
POTASSIUM: 4.3 mmol/L (ref 3.5–5.1)
SODIUM: 147 mmol/L — AB (ref 135–145)
Total Bilirubin: 0.8 mg/dL (ref 0.3–1.2)
Total Protein: 6.6 g/dL (ref 6.5–8.1)

## 2017-04-01 LAB — LACTIC ACID, PLASMA
LACTIC ACID, VENOUS: 2.5 mmol/L — AB (ref 0.5–1.9)
Lactic Acid, Venous: 1.8 mmol/L (ref 0.5–1.9)

## 2017-04-01 LAB — TROPONIN I

## 2017-04-01 LAB — TSH: TSH: 1.804 u[IU]/mL (ref 0.350–4.500)

## 2017-04-01 LAB — BRAIN NATRIURETIC PEPTIDE: B Natriuretic Peptide: 58 pg/mL (ref 0.0–100.0)

## 2017-04-01 MED ORDER — ALBUTEROL SULFATE (2.5 MG/3ML) 0.083% IN NEBU: 2.5000 mg | INHALATION_SOLUTION | Freq: Once | RESPIRATORY_TRACT | Status: DC

## 2017-04-01 MED ORDER — METHYLPREDNISOLONE SODIUM SUCC 125 MG IJ SOLR
60.0000 mg | Freq: Once | INTRAMUSCULAR | Status: AC
  Administered 2017-04-01: 60 mg via INTRAVENOUS
  Filled 2017-04-01: qty 2

## 2017-04-01 MED ORDER — HEPARIN SODIUM (PORCINE) 5000 UNIT/ML IJ SOLN
5000.0000 [IU] | Freq: Three times a day (TID) | INTRAMUSCULAR | Status: DC
  Administered 2017-04-01 – 2017-04-05 (×10): 5000 [IU] via SUBCUTANEOUS
  Filled 2017-04-01 (×11): qty 1

## 2017-04-01 MED ORDER — TIOTROPIUM BROMIDE MONOHYDRATE 18 MCG IN CAPS
18.0000 ug | ORAL_CAPSULE | Freq: Every day | RESPIRATORY_TRACT | Status: DC
  Administered 2017-04-01: 13:00:00 18 ug via RESPIRATORY_TRACT
  Filled 2017-04-01: qty 5

## 2017-04-01 MED ORDER — DOCUSATE SODIUM 100 MG PO CAPS
100.0000 mg | ORAL_CAPSULE | Freq: Two times a day (BID) | ORAL | Status: DC
  Administered 2017-04-01 – 2017-04-07 (×9): 100 mg via ORAL
  Filled 2017-04-01 (×11): qty 1

## 2017-04-01 MED ORDER — ONDANSETRON HCL 4 MG PO TABS: 4.0000 mg | ORAL_TABLET | Freq: Four times a day (QID) | ORAL | Status: DC | PRN

## 2017-04-01 MED ORDER — IPRATROPIUM-ALBUTEROL 0.5-2.5 (3) MG/3ML IN SOLN
3.0000 mL | Freq: Once | RESPIRATORY_TRACT | Status: AC
  Administered 2017-04-01: 3 mL via RESPIRATORY_TRACT
  Filled 2017-04-01: qty 3

## 2017-04-01 MED ORDER — TRAZODONE HCL 50 MG PO TABS
25.0000 mg | ORAL_TABLET | Freq: Every evening | ORAL | Status: DC | PRN
  Administered 2017-04-01 – 2017-04-03 (×3): 25 mg via ORAL
  Filled 2017-04-01 (×3): qty 1

## 2017-04-01 MED ORDER — GUAIFENESIN ER 600 MG PO TB12
600.0000 mg | ORAL_TABLET | Freq: Two times a day (BID) | ORAL | Status: DC
  Administered 2017-04-01 – 2017-04-03 (×6): 600 mg via ORAL
  Filled 2017-04-01 (×7): qty 1

## 2017-04-01 MED ORDER — METOPROLOL SUCCINATE ER 25 MG PO TB24
25.0000 mg | ORAL_TABLET | Freq: Every day | ORAL | Status: DC
  Administered 2017-04-01 – 2017-04-07 (×7): 25 mg via ORAL
  Filled 2017-04-01 (×8): qty 1

## 2017-04-01 MED ORDER — ORAL CARE MOUTH RINSE
15.0000 mL | Freq: Two times a day (BID) | OROMUCOSAL | Status: DC
  Administered 2017-04-01 – 2017-04-07 (×7): 15 mL via OROMUCOSAL

## 2017-04-01 MED ORDER — ONDANSETRON HCL 4 MG/2ML IJ SOLN: 4.0000 mg | Freq: Four times a day (QID) | INTRAMUSCULAR | Status: DC | PRN

## 2017-04-01 MED ORDER — CLOPIDOGREL BISULFATE 75 MG PO TABS
75.0000 mg | ORAL_TABLET | Freq: Every day | ORAL | Status: DC
  Administered 2017-04-01 – 2017-04-07 (×6): 75 mg via ORAL
  Filled 2017-04-01 (×7): qty 1

## 2017-04-01 MED ORDER — IPRATROPIUM-ALBUTEROL 0.5-2.5 (3) MG/3ML IN SOLN
3.0000 mL | Freq: Once | RESPIRATORY_TRACT | Status: AC
Start: 2017-04-01 — End: 2017-04-01
  Administered 2017-04-01: 3 mL via RESPIRATORY_TRACT
  Filled 2017-04-01: qty 3

## 2017-04-01 MED ORDER — AZITHROMYCIN 500 MG PO TABS
500.0000 mg | ORAL_TABLET | Freq: Once | ORAL | Status: AC
  Administered 2017-04-01: 500 mg via ORAL
  Filled 2017-04-01: qty 1

## 2017-04-01 MED ORDER — DEXTROMETHORPHAN POLISTIREX ER 30 MG/5ML PO SUER
30.0000 mg | Freq: Two times a day (BID) | ORAL | Status: DC
  Administered 2017-04-01 – 2017-04-03 (×6): 30 mg via ORAL
  Filled 2017-04-01 (×8): qty 5

## 2017-04-01 MED ORDER — AZITHROMYCIN 500 MG PO TABS
250.0000 mg | ORAL_TABLET | ORAL | Status: DC
  Administered 2017-04-05 – 2017-04-07 (×2): 250 mg via ORAL
  Filled 2017-04-01: qty 1

## 2017-04-01 MED ORDER — NITROGLYCERIN 0.4 MG SL SUBL: 0.4000 mg | SUBLINGUAL_TABLET | SUBLINGUAL | Status: DC | PRN

## 2017-04-01 MED ORDER — MOMETASONE FURO-FORMOTEROL FUM 200-5 MCG/ACT IN AERO
2.0000 | INHALATION_SPRAY | Freq: Two times a day (BID) | RESPIRATORY_TRACT | Status: DC
  Administered 2017-04-01 – 2017-04-07 (×12): 2 via RESPIRATORY_TRACT
  Filled 2017-04-01: qty 8.8

## 2017-04-01 MED ORDER — HYDROCODONE-ACETAMINOPHEN 5-325 MG PO TABS: 1.0000 | ORAL_TABLET | ORAL | Status: DC | PRN

## 2017-04-01 MED ORDER — BISACODYL 5 MG PO TBEC
5.0000 mg | DELAYED_RELEASE_TABLET | Freq: Every day | ORAL | Status: DC | PRN
  Filled 2017-04-01: qty 1

## 2017-04-01 MED ORDER — ACETAMINOPHEN 325 MG PO TABS
650.0000 mg | ORAL_TABLET | Freq: Four times a day (QID) | ORAL | Status: DC | PRN
  Administered 2017-04-02 – 2017-04-06 (×3): 650 mg via ORAL
  Filled 2017-04-01 (×3): qty 2

## 2017-04-01 MED ORDER — ASPIRIN EC 81 MG PO TBEC
81.0000 mg | DELAYED_RELEASE_TABLET | Freq: Every day | ORAL | Status: DC
  Administered 2017-04-01 – 2017-04-07 (×7): 81 mg via ORAL
  Filled 2017-04-01 (×7): qty 1

## 2017-04-01 MED ORDER — AZITHROMYCIN 250 MG PO TABS
250.0000 mg | ORAL_TABLET | Freq: Every day | ORAL | Status: DC
  Administered 2017-04-02 – 2017-04-04 (×3): 250 mg via ORAL
  Filled 2017-04-01 (×4): qty 1

## 2017-04-01 MED ORDER — DM-GUAIFENESIN ER 30-600 MG PO TB12: 1.0000 | ORAL_TABLET | Freq: Two times a day (BID) | ORAL | Status: DC

## 2017-04-01 MED ORDER — ETHAMBUTOL HCL 400 MG PO TABS
1200.0000 mg | ORAL_TABLET | ORAL | Status: DC
  Administered 2017-04-02 – 2017-04-07 (×3): 1200 mg via ORAL
  Filled 2017-04-01 (×4): qty 3

## 2017-04-01 MED ORDER — METHYLPREDNISOLONE SODIUM SUCC 125 MG IJ SOLR
60.0000 mg | Freq: Four times a day (QID) | INTRAMUSCULAR | Status: AC
  Administered 2017-04-01 – 2017-04-02 (×4): 60 mg via INTRAVENOUS
  Filled 2017-04-01 (×4): qty 2

## 2017-04-01 MED ORDER — MOMETASONE FURO-FORMOTEROL FUM 100-5 MCG/ACT IN AERO
2.0000 | INHALATION_SPRAY | Freq: Two times a day (BID) | RESPIRATORY_TRACT | Status: DC
  Filled 2017-04-01: qty 8.8

## 2017-04-01 MED ORDER — PREDNISONE 20 MG PO TABS
40.0000 mg | ORAL_TABLET | Freq: Every day | ORAL | Status: DC
  Administered 2017-04-02: 40 mg via ORAL
  Filled 2017-04-01: qty 2

## 2017-04-01 MED ORDER — SODIUM CHLORIDE 0.9 % IV SOLN
INTRAVENOUS | Status: DC
  Administered 2017-04-01 – 2017-04-04 (×4): via INTRAVENOUS

## 2017-04-01 MED ORDER — IPRATROPIUM-ALBUTEROL 0.5-2.5 (3) MG/3ML IN SOLN
3.0000 mL | Freq: Four times a day (QID) | RESPIRATORY_TRACT | Status: DC
  Administered 2017-04-01 – 2017-04-02 (×4): 3 mL via RESPIRATORY_TRACT
  Filled 2017-04-01 (×4): qty 3

## 2017-04-01 MED ORDER — RIFAMPIN 300 MG PO CAPS
600.0000 mg | ORAL_CAPSULE | ORAL | Status: DC
  Administered 2017-04-02 – 2017-04-07 (×3): 600 mg via ORAL
  Filled 2017-04-01 (×4): qty 2

## 2017-04-01 MED ORDER — IOPAMIDOL (ISOVUE-370) INJECTION 76%
75.0000 mL | Freq: Once | INTRAVENOUS | Status: AC | PRN
  Administered 2017-04-01: 14:00:00 75 mL via INTRAVENOUS

## 2017-04-01 MED ORDER — ARFORMOTEROL TARTRATE 15 MCG/2ML IN NEBU
15.0000 ug | INHALATION_SOLUTION | Freq: Two times a day (BID) | RESPIRATORY_TRACT | Status: DC
  Filled 2017-04-01: qty 2

## 2017-04-01 MED ORDER — ACETAMINOPHEN 650 MG RE SUPP: 650.0000 mg | Freq: Four times a day (QID) | RECTAL | Status: DC | PRN

## 2017-04-01 NOTE — ED Triage Notes (Signed)
Pt to ed with c/o sob, increased mucus, cough from home.  Pt wife reports increased urination and "talking out of his head" with all over shaking.  Pt alert on arrival to ed.  Pt on 2plm o2 at home continuously.  Pt appears sob while sitting, with nasal flaring and labored breaths.

## 2017-04-01 NOTE — Progress Notes (Signed)
Dr Sherryll BurgerShah made aware of lactic acid 2.5, states that he will be up to see pt soon

## 2017-04-01 NOTE — ED Provider Notes (Signed)
[facility] Emergency Department Provider Note   ____________________________________________   First MD Initiated Contact with Patient 04/01/17 [phone]     (approximate)  I have reviewed the triage vital signs and the nursing notes.   HISTORY  Chief Complaint Shortness of Breath    HPI Jack Jenkins is a 83 y.o. male who is complaining of shortness of breath coughing up more mucus than usual. He and his wife reports that usually white sometimes thick but not always his wife reports she is urinating more than usual and last 2 nights has been talking out of his head and shaking. He becomes exquisitely short of breath after walking he walked in from registration without his oxygen and his O2 sat when he sat down with 65% on oxygen it went up slowly the highest dose seen is about 90% but it's usually running about 88. Patient also has swelling in the right leg which is worse than the left not quite sure how long this has been going on he can't really tell me.  Past Medical History:  Diagnosis Date  . CHF (congestive heart failure) ([place])   . COPD (chronic obstructive pulmonary disease) ([place])   . Gout   . Renal disorder     Patient Active Problem List   Diagnosis Date Noted  . Hyperlipidemia, mixed 10/15/2016  . Chest pain 10/07/2016  . Acute respiratory failure ([place]) 04/21/2015  . Dyspnea 04/21/2015  . COPD exacerbation ([place]) 04/21/2015  . Abnormal chest CT 04/21/2015  . CKD (chronic kidney disease) 12/17/2014  . Photokeratitis 11/06/2014  . Allergic rhinitis 11/06/2014  . AB (asthmatic bronchitis) 11/06/2014  . CCF (congestive cardiac failure) ([place]) 11/06/2014  . Colon polyp 11/06/2014  . Urinary system disease 11/06/2014  . ED (erectile dysfunction) of organic origin 11/06/2014  . Acid reflux 11/06/2014  . Acute gouty arthropathy 11/06/2014  . Gout 11/06/2014  . Arthritis, degenerative 11/06/2014  . Fast heart beat 11/06/2014    Past Surgical  History:  Procedure Laterality Date  . BACK SURGERY    . CATARACT EXTRACTION Bilateral   . CHOLECYSTECTOMY    . FLEXIBLE BRONCHOSCOPY N/A 02/09/2017   Procedure: FLEXIBLE BRONCHOSCOPY;  Surgeon: Allyne Gee, MD;  Location: [facility] ORS;  Service: Pulmonary;  Laterality: N/A;  . HERNIA REPAIR    . LAMINECTOMY    . POLYPECTOMY     colon  . UPPER GI ENDOSCOPY  01/15/04    Prior to Admission medications   Medication Sig Start Date End Date Taking? Authorizing Provider  allopurinol (ZYLOPRIM) 100 MG tablet Take 1 tablet (100 mg total) by mouth daily as needed (for gout). Patient taking differently: Take 100 mg by mouth daily.  10/20/16  Yes [name]., MD  aspirin EC 81 MG EC tablet Take 1 tablet (81 mg total) by mouth daily. 04/24/15  Yes Fritzi Mandes, MD  azithromycin (ZITHROMAX) 250 MG tablet Take 1 tablet by mouth 3 (three) times a week. 03/24/17  Yes [provider]  ethambutol (MYAMBUTOL) 400 MG tablet Take 1,200 mg by mouth 3 (three) times a week. 03/24/17  Yes [provider]  furosemide (LASIX) 40 MG tablet Take 1 tablet (40 mg total) by mouth daily as needed for fluid. 10/20/16  Yes [name]., MD  metoprolol succinate (TOPROL XL) 25 MG 24 hr tablet Take 1 tablet (25 mg total) by mouth daily. 10/20/16  Yes [name]., MD  rifampin (RIFADIN) 300 MG capsule Take 600  mg by mouth 3 (three) times a week. 02/16/17  Yes [provider]  tiotropium (SPIRIVA) 18 MCG inhalation capsule Place 1 capsule (18 mcg total) into inhaler and inhale daily. 04/24/15  Yes Fritzi Mandes, MD  clopidogrel (PLAVIX) 75 MG tablet Take 1 tablet (75 mg total) by mouth daily. Patient not taking: Reported on 10/09/2016 10/08/16 10/08/17  Loletha Grayer, MD  dextromethorphan-guaiFENesin [facility] DM) 30-600 MG 12hr tablet Take 1 tablet by mouth 2 (two) times daily. Patient not taking: Reported on 02/08/2017 11/12/16   Chrismon, Vickki Muff, PA  mometasone-formoterol  (DULERA) 100-5 MCG/ACT AERO Inhale 2 puffs into the lungs 2 (two) times daily. Patient not taking: Reported on 11/12/2016 04/24/15   Fritzi Mandes, MD  nitroGLYCERIN (NITROSTAT) 0.4 MG SL tablet Place 1 tablet (0.4 mg total) under the tongue every 5 (five) minutes as needed for chest pain. Patient not taking: Reported on 10/09/2016 10/08/16   Loletha Grayer, MD  ranitidine (ZANTAC) 150 MG tablet Take 1 tablet (150 mg total) by mouth 2 (two) times daily. Patient not taking: Reported on 04/01/2017 12/17/14   Carmon Ginsberg, PA    Allergies Patient has no known allergies.  Family History  Problem Relation Age of Onset  . Heart failure Mother   . Heart attack Father   . CAD Sister   . Lung cancer Brother   . CAD Sister     Social History Social History   Tobacco Use  . Smoking status: Never Smoker  . Smokeless tobacco: Never Used  Substance Use Topics  . Alcohol use: No    Alcohol/week: 0.0 oz  . Drug use: No    Review of Systems  Constitutional: No fever/chills Eyes: No visual changes. ENT: No sore throat. Cardiovascular: Denies chest pain. Respiratory:  shortness of breath. Gastrointestinal: No abdominal pain.  No nausea, no vomiting.  No diarrhea.  No constipation. Genitourinary: Negative for dysuria. Musculoskeletal: Negative for back pain. Skin: Negative for rash. Neurological: Negative for headaches, focal weakness   ____________________________________________   PHYSICAL EXAM:  VITAL SIGNS: ED Triage Vitals  Enc Vitals Group     BP --      Pulse Rate 04/01/17 0741 (!) 120     Resp 04/01/17 0741 (!) 32     Temp 04/01/17 0741 98.5 F (36.9 C)     Temp Source 04/01/17 0741 Oral     SpO2 04/01/17 0741 (!) 75 %     Weight 04/01/17 0742 214 lb (97.1 kg)     Height --      Head Circumference --      Peak Flow --      Pain Score --      Pain Loc --      Pain Edu? --      Excl. in [place]? --     Constitutional: Alert and oriented. working hard to breathe Eyes:  Conjunctivae are normal Head: Atraumatic. Nose: No congestion/rhinnorhea. Mouth/Throat: Mucous membranes are moist.  Oropharynx non-erythematous. Neck: No stridor.  Cardiovascular: rapidl rate, regular rhythm. Grossly normal heart sounds.  Good peripheral circulation. Respiratory: increased respiratory effort. using accessory muscles and having retractions. Lungs rhonchi throughout Gastrointestinal: Soft and nontender. No distention. No abdominal bruits. No CVA tenderness. Musculoskeletal: No lower extremity tenderness edema in the right leg only.  No joint effusions. Neurologic:  Normal speech and language. No gross focal neurologic deficits are appreciated. No gait instability. Skin:  Skin is warm, dry and intact. No rash noted. Psychiatric: Mood and affect are normal.  Speech and behavior are normal.  ____________________________________________   LABS (all labs ordered are listed, but only abnormal results are displayed)  Labs Reviewed  COMPREHENSIVE METABOLIC PANEL - Abnormal; Notable for the following components:      Result Value   Sodium 147 (*)    Chloride 98 (*)    CO2 39 (*)    Glucose, Bld 105 (*)    Creatinine, Ser 1.31 (*)    ALT 14 (*)    GFR calc non Af Amer 48 (*)    GFR calc Af Amer 56 (*)    All other components within normal limits  CBC WITH DIFFERENTIAL/PLATELET - Abnormal; Notable for the following components:   RBC 3.91 (*)    Hemoglobin 12.1 (*)    HCT 38.3 (*)    MCHC 31.6 (*)    RDW 15.1 (*)    Platelets 126 (*)    All other components within normal limits  BRAIN NATRIURETIC PEPTIDE  TROPONIN I  LACTIC ACID, PLASMA  LACTIC ACID, PLASMA   ____________________________________________  EKG  EKG read and interpreted by me shows sinus tachycardia rate of 111 rightward axis right bundle branch block. Also reading left posterior hemiblock. There are no acute ST-T wave changes that I can  see. ____________________________________________  RADIOLOGY chest x-ray read as no acute disease  ____________________________________________   PROCEDURES  Procedure(s) performed:   Procedures  Critical Care performed:   ____________________________________________   INITIAL IMPRESSION / ASSESSMENT AND PLAN / ED COURSE  patient still desats when he walks. His episodes of talking out of his head shaking and reaching up into the air and picking at things that are not there sounds suspiciously like delirium. He is not acting like that at present but for the last 2 nights he has been. This isn't new behavior for him. I believe between     ____________________________________________   FINAL CLINICAL IMPRESSION(S) / ED DIAGNOSES  Final diagnoses:  COPD exacerbation ([place])  Hypoxia  Delirium     ED Discharge Orders    None       Note:  This document was prepared using Dragon voice recognition software and may include unintentional dictation errors.    Nena Polio, MD 04/01/17 [phone]

## 2017-04-01 NOTE — H&P (Addendum)
[place] [facility] [place] NAME: Jack Jenkins    MR#:  [id number]  DATE OF BIRTH:  Sep 08, 1930  DATE OF ADMISSION:  04/01/2017  PRIMARY CARE PHYSICIAN: [name]., MD   REQUESTING/REFERRING PHYSICIAN: Nena Polio, MD  CHIEF COMPLAINT:   Chief Complaint  Patient presents with  . Shortness of Breath    HISTORY OF PRESENT ILLNESS:  Jack Jenkins  is a 83 y.o. 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  He has been started by pulmonology on rifampin, ethambutol, azithromycin regimen for 9 months.  She has been feeling on and off short of breath for years has been getting worse over last 6 months and past 2-3 weeks it has been unmanageable.  Patient sees Dr. Humphrey Rolls who performed bronchoscopy on 10/2 was placed on antibiotics as mentioned before for Mycobacterium avium infection patient is still not feeling any better reports.  His oxygen level dropping in the 70s with minimal exertion.  He also reports cough. No fever. PAST MEDICAL HISTORY:   Past Medical History:  Diagnosis Date  . CHF (congestive heart failure) ([place])   . COPD (chronic obstructive pulmonary disease) ([place])   . Gout   . Renal disorder     PAST SURGICAL HISTORY:   Past Surgical History:  Procedure Laterality Date  . BACK SURGERY    . CATARACT EXTRACTION Bilateral   . CHOLECYSTECTOMY    . FLEXIBLE BRONCHOSCOPY N/A 02/09/2017   Procedure: FLEXIBLE BRONCHOSCOPY;  Surgeon: Allyne Gee, MD;  Location: [facility] ORS;  Service: Pulmonary;  Laterality: N/A;  . HERNIA REPAIR    . LAMINECTOMY    . POLYPECTOMY     colon  . UPPER GI ENDOSCOPY  01/15/04   SOCIAL HISTORY:   Social History   Tobacco Use  . Smoking status: Never Smoker  . Smokeless tobacco: Never Used  Substance Use Topics  . Alcohol use: No    Alcohol/week: 0.0 oz    FAMILY HISTORY:   Family History   Problem Relation Age of Onset  . Heart failure Mother   . Heart attack Father   . CAD Sister   . Lung cancer Brother   . CAD Sister     DRUG ALLERGIES:  No Known Allergies  REVIEW OF SYSTEMS:   Review of Systems  Constitutional: Positive for malaise/fatigue. Negative for chills, fever and weight loss.  HENT: Negative for nosebleeds and sore throat.   Eyes: Negative for blurred vision.  Respiratory: Positive for cough and shortness of breath. Negative for wheezing.   Cardiovascular: Negative for chest pain, orthopnea, leg swelling and PND.  Gastrointestinal: Negative for abdominal pain, constipation, diarrhea, heartburn, nausea and vomiting.  Genitourinary: Negative for dysuria and urgency.  Musculoskeletal: Negative for back pain.  Skin: Negative for rash.  Neurological: Positive for weakness. Negative for dizziness, speech change, focal weakness and headaches.  Endo/Heme/Allergies: Does not bruise/bleed easily.  Psychiatric/Behavioral: Negative for depression.    MEDICATIONS AT HOME:   Prior to Admission medications   Medication Sig Start Date End Date Taking? Authorizing Provider  allopurinol (ZYLOPRIM) 100 MG tablet Take 1 tablet (100 mg total) by mouth daily as needed (for gout). Patient taking differently: Take 100 mg by mouth daily.  10/20/16  Yes [name]., MD  aspirin EC 81 MG EC tablet Take 1 tablet (81 mg total) by mouth daily. 04/24/15  Yes Fritzi Mandes, MD  azithromycin (ZITHROMAX) 250 MG tablet Take 1 tablet by mouth 3 (three) times a week. 03/24/17  Yes [provider]  ethambutol (MYAMBUTOL) 400 MG tablet Take 1,200 mg by mouth 3 (three) times a week. 03/24/17  Yes [provider]  furosemide (LASIX) 40 MG tablet Take 1 tablet (40 mg total) by mouth daily as needed for fluid. 10/20/16  Yes [name]., MD  metoprolol succinate (TOPROL XL) 25 MG 24 hr tablet Take 1 tablet (25 mg total) by mouth daily. 10/20/16  Yes [name]., MD  rifampin (RIFADIN) 300 MG capsule Take 600 mg by mouth 3 (three) times a week. 02/16/17  Yes [provider]  tiotropium (SPIRIVA) 18 MCG inhalation capsule Place 1 capsule (18 mcg total) into inhaler and inhale daily. 04/24/15  Yes Fritzi Mandes, MD  clopidogrel (PLAVIX) 75 MG tablet Take 1 tablet (75 mg total) by mouth daily. Patient not taking: Reported on 10/09/2016 10/08/16 10/08/17  Loletha Grayer, MD  dextromethorphan-guaiFENesin [facility] DM) 30-600 MG 12hr tablet Take 1 tablet by mouth 2 (two) times daily. Patient not taking: Reported on 02/08/2017 11/12/16   Chrismon, Vickki Muff, PA  mometasone-formoterol (DULERA) 100-5 MCG/ACT AERO Inhale 2 puffs into the lungs 2 (two) times daily. Patient not taking: Reported on 11/12/2016 04/24/15   Fritzi Mandes, MD  nitroGLYCERIN (NITROSTAT) 0.4 MG SL tablet Place 1 tablet (0.4 mg total) under the tongue every 5 (five) minutes as needed for chest pain. Patient not taking: Reported on 10/09/2016 10/08/16   Loletha Grayer, MD  ranitidine (ZANTAC) 150 MG tablet Take 1 tablet (150 mg total) by mouth 2 (two) times daily. Patient not taking: Reported on 04/01/2017 12/17/14   Carmon Ginsberg, PA      VITAL SIGNS:  Blood pressure 132/69, pulse (!) 119, temperature 98.7 F (37.1 C), temperature source Oral, resp. rate 20, height _0  (1.753 m), weight 95.8 kg (211 lb 4.8 oz), SpO2 95 %.  PHYSICAL EXAMINATION:  Physical Exam  Constitutional: He is oriented to person, place, and time and well-developed, well-nourished, and in no distress.  HENT:  Head: Normocephalic and atraumatic.  Eyes: Conjunctivae and EOM are normal. Pupils are equal, round, and reactive to light.  Neck: Normal range of motion. Neck supple. No tracheal deviation present. No thyromegaly present.  Cardiovascular: Normal rate, regular rhythm and normal heart sounds.  Pulmonary/Chest: Effort normal and breath sounds normal. No respiratory distress. He has no wheezes. He  exhibits no tenderness.  Abdominal: Soft. Bowel sounds are normal. He exhibits no distension. There is no tenderness.  Musculoskeletal: Normal range of motion.  Neurological: He is alert and oriented to person, place, and time. No cranial nerve deficit.  Skin: Skin is warm and dry. No rash noted.  Psychiatric: Mood and affect normal.   LABORATORY PANEL:   CBC Recent Labs  Lab 04/01/17 0749  WBC 3.8  HGB 12.1*  HCT 38.3*  PLT 126*   ------------------------------------------------------------------------------------------------------------------  Chemistries  Recent Labs  Lab 04/01/17 0749  NA 147*  K 4.3  CL 98*  CO2 39*  GLUCOSE 105*  BUN 16  CREATININE 1.31*  CALCIUM 9.2  AST 28  ALT 14*  ALKPHOS 81  BILITOT 0.8   ------------------------------------------------------------------------------------------------------------------  Cardiac Enzymes Recent Labs  Lab 04/01/17 0749  TROPONINI <0.03   ------------------------------------------------------------------------------------------------------------------  RADIOLOGY:  Ct Head Wo Contrast  Result Date: 04/01/2017 CLINICAL DATA:  Mental status changes. EXAM: CT HEAD WITHOUT CONTRAST TECHNIQUE: Contiguous axial  images were obtained from the base of the skull through the vertex without intravenous contrast. COMPARISON:  None. FINDINGS: Brain: The ventricles are normal in size and configuration. No extra-axial fluid collections are identified. The gray-white differentiation is normal. No CT findings for acute intracranial process such as hemorrhage or infarction. 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 No mass lesions. The brainstem and cerebellum are grossly normal. Vascular: Vascular calcifications but no definite aneurysm or hyperdense vessels. Skull: No skull fracture or bone lesion. Sinuses/Orbits: The paranasal sinuses and mastoid air cells are clear. The globes are  intact. Other: No scalp lesions or hematoma. IMPRESSION: No acute intracranial findings or mass lesion. Electronically Signed   By: Marijo Sanes M.D.   On: 04/01/2017 10:57   US Venous Img Lower Unilateral Right  Result Date: 04/01/2017 CLINICAL DATA:  Right leg swelling for 2 years. Short of breath. Worsening over the last 3 weeks. EXAM: RIGHT LOWER EXTREMITY VENOUS DUPLEX ULTRASOUND TECHNIQUE: Doppler venous assessment of the right lower extremity deep venous system was performed, including characterization of spectral flow, compressibility, and phasicity. COMPARISON:  None. FINDINGS: There is complete compressibility of the right common femoral, femoral, and popliteal veins. Doppler analysis demonstrates respiratory phasicity and augmentation of flow with calf compression. No obvious superficial vein or calf vein thrombosis. IMPRESSION: No evidence of right lower extremity DVT. Electronically Signed   By: Marybelle Killings M.D.   On: 04/01/2017 08:39   Dg Chest Portable 1 View  Result Date: 04/01/2017 CLINICAL DATA:  Short of breath EXAM: PORTABLE CHEST 1 VIEW COMPARISON:  02/09/2017 FINDINGS: Bibasilar airspace disease left greater than right unchanged. Negative for heart failure or effusion. 1 cm nodular density overlying the left fourth rib posteriorly compatible with a bone island in the rib. IMPRESSION: Bibasilar atelectasis unchanged. Electronically Signed   By: Franchot Gallo M.D.   On: 04/01/2017 08:12   IMPRESSION AND PLAN:  83 year old male with worsening shortness of breath  * Acute on chronic hypoxic respiratory failure -Normally wears 2 L oxygen but he was still in 70s in the emergency department so was placed on 3-4 L usually now is back down to 2 L -This is likely due to severe interstitial lung disease and Mycobacterium avium infection in the lung -Obtain CT chest for further evaluation -Lower extremity Dopplers negative  * COPD exacerbation: Continue home inhalers -Add IV  Solu-Medrol & nebs  * Chronic systolic CHF (EF 40-34%): Continue metoprolol.  -He is well compensated at this time.  In fact may need some fluids  *Lactic acidosis -Continue antibiotic for his Mycobacterium avium infection -Monitor lactic acid with aggressive IV hydration  * Mycobacterium avium Lung infection - BAL + for above - continue triple Abx  - Pulmo & ID c/s  * Chronic kidney disease stage III: At baseline        All the records are reviewed and case discussed with ED provider. Management plans discussed with the patient, family (wife at bedside) and they are in agreement.  CODE STATUS: Full code  TOTAL TIME TAKING CARE OF THIS PATIENT: 45 minutes.    Max Sane M.D on 04/01/2017 at 2:25 PM  Between 7am to 6pm - Pager - [phone]  After 6pm go to [email or web address] - password [email or web address] [facility]  [facility] [employer] [place] [facility]  Office  [phone]  CC: Primary care physician; [name]., MD   Note: This dictation was prepared with Dragon dictation along with smaller phrase technology.  Any transcriptional errors that result from this process are unintentional.

## 2017-04-01 NOTE — Plan of Care (Signed)
  Progressing Education: Knowledge of General Education information will improve 04/01/2017 1231 - Progressing by Burnett KanarisPerez, Saahir Prude N, RN Education: Knowledge of disease or condition will improve 04/01/2017 1231 - Progressing by Burnett KanarisPerez, Jahzara Slattery N, RN Knowledge of the prescribed therapeutic regimen will improve 04/01/2017 1231 - Progressing by Burnett KanarisPerez, Christabelle Hanzlik N, RN Health Behavior/Discharge Planning: Ability to manage health-related needs will improve 04/01/2017 1231 - Progressing by Burnett KanarisPerez, Laken Lobato N, RN Respiratory: Ability to maintain a clear airway will improve 04/01/2017 1231 - Progressing by Burnett KanarisPerez, Jillyn Stacey N, RN Levels of oxygenation will improve 04/01/2017 1231 - Progressing by Burnett KanarisPerez, Kano Heckmann N, RN Ability to maintain adequate ventilation will improve 04/01/2017 1231 - Progressing by Burnett KanarisPerez, Twana Wileman N, RN Complications related to the disease process, condition or treatment will be avoided or minimized 04/01/2017 1231 - Progressing by Burnett KanarisPerez, Raegen Tarpley N, RN

## 2017-04-01 NOTE — ED Notes (Signed)
Pt up to toilet in room, on 2L oxygen via [place]. Pt's sats dropped to 77%. MD notified.

## 2017-04-02 ENCOUNTER — Inpatient Hospital Stay: Payer: Medicare Other

## 2017-04-02 LAB — CBC
HCT: 35 % — ABNORMAL LOW (ref 40.0–52.0)
HEMOGLOBIN: 11.4 g/dL — AB (ref 13.0–18.0)
MCH: 31.6 pg (ref 26.0–34.0)
MCHC: 32.5 g/dL (ref 32.0–36.0)
MCV: 97.1 fL (ref 80.0–100.0)
PLATELETS: 128 10*3/uL — AB (ref 150–440)
RBC: 3.61 MIL/uL — ABNORMAL LOW (ref 4.40–5.90)
RDW: 15.3 % — ABNORMAL HIGH (ref 11.5–14.5)
WBC: 6 10*3/uL (ref 3.8–10.6)

## 2017-04-02 LAB — BASIC METABOLIC PANEL
ANION GAP: 9 (ref 5–15)
Anion gap: 12 (ref 5–15)
BUN: 22 mg/dL — ABNORMAL HIGH (ref 6–20)
BUN: 22 mg/dL — AB (ref 6–20)
CALCIUM: 9.2 mg/dL (ref 8.9–10.3)
CALCIUM: 9.4 mg/dL (ref 8.9–10.3)
CO2: 36 mmol/L — ABNORMAL HIGH (ref 22–32)
CO2: 36 mmol/L — ABNORMAL HIGH (ref 22–32)
CREATININE: 1.43 mg/dL — AB (ref 0.61–1.24)
CREATININE: 1.41 mg/dL — AB (ref 0.61–1.24)
Chloride: 98 mmol/L — ABNORMAL LOW (ref 101–111)
Chloride: 97 mmol/L — ABNORMAL LOW (ref 101–111)
GFR calc Af Amer: 51 mL/min — ABNORMAL LOW (ref >60)
GFR, EST AFRICAN AMERICAN: 50 mL/min — AB (ref >60)
GFR, EST NON AFRICAN AMERICAN: 43 mL/min — AB (ref >60)
GFR, EST NON AFRICAN AMERICAN: 44 mL/min — AB (ref >60)
GLUCOSE: 127 mg/dL — AB (ref 65–99)
GLUCOSE: 113 mg/dL — AB (ref 65–99)
POTASSIUM: 5.4 mmol/L — AB (ref 3.5–5.1)
Potassium: 5.4 mmol/L — ABNORMAL HIGH (ref 3.5–5.1)
SODIUM: 145 mmol/L (ref 135–145)
Sodium: 143 mmol/L (ref 135–145)

## 2017-04-02 LAB — LACTIC ACID, PLASMA
LACTIC ACID, VENOUS: 2 mmol/L — AB (ref 0.5–1.9)
Lactic Acid, Venous: 3 mmol/L (ref 0.5–1.9)

## 2017-04-02 LAB — GLUCOSE, CAPILLARY: GLUCOSE-CAPILLARY: 105 mg/dL — AB (ref 65–99)

## 2017-04-02 MED ORDER — IPRATROPIUM-ALBUTEROL 0.5-2.5 (3) MG/3ML IN SOLN
3.0000 mL | Freq: Three times a day (TID) | RESPIRATORY_TRACT | Status: DC
  Administered 2017-04-02 – 2017-04-05 (×8): 3 mL via RESPIRATORY_TRACT
  Filled 2017-04-02 (×8): qty 3

## 2017-04-02 MED ORDER — ADULT MULTIVITAMIN W/MINERALS CH
1.0000 | ORAL_TABLET | Freq: Every day | ORAL | Status: DC
  Administered 2017-04-02 – 2017-04-07 (×6): 1 via ORAL
  Filled 2017-04-02 (×6): qty 1

## 2017-04-02 MED ORDER — SODIUM POLYSTYRENE SULFONATE 15 GM/60ML PO SUSP
30.0000 g | Freq: Once | ORAL | Status: AC
  Administered 2017-04-02: 30 g via ORAL
  Filled 2017-04-02: qty 120

## 2017-04-02 MED ORDER — DEXTROSE 50 % IV SOLN
25.0000 mL | Freq: Once | INTRAVENOUS | Status: AC
  Administered 2017-04-02: 09:00:00 25 mL via INTRAVENOUS
  Filled 2017-04-02: qty 50

## 2017-04-02 MED ORDER — INSULIN ASPART 100 UNIT/ML ~~LOC~~ SOLN
10.0000 [IU] | Freq: Once | SUBCUTANEOUS | Status: AC
  Administered 2017-04-02: 10 [IU] via INTRAVENOUS
  Filled 2017-04-02: qty 1

## 2017-04-02 MED ORDER — NEPRO/CARBSTEADY PO LIQD
237.0000 mL | Freq: Two times a day (BID) | ORAL | Status: DC
  Administered 2017-04-03 – 2017-04-07 (×5): 237 mL via ORAL

## 2017-04-02 MED ORDER — ENSURE ENLIVE PO LIQD: 237.0000 mL | Freq: Two times a day (BID) | ORAL | Status: DC

## 2017-04-02 NOTE — Progress Notes (Addendum)
[facility] [place]    MR#:  [id number]  DATE OF BIRTH:  02-Jan-1931  SUBJECTIVE:   Patient with cough and somewhat improved breathing this morning.   REVIEW OF SYSTEMS:    Review of Systems  Constitutional: Negative for fever, chills weight loss HENT: Negative for ear pain, nosebleeds, congestion, facial swelling, rhinorrhea, neck pain, neck stiffness and ear discharge.   Respiratory: Positive for cough and shortness of breath Cardiovascular: Negative for chest pain, palpitations and leg swelling.  Gastrointestinal: Negative for heartburn, abdominal pain, vomiting, diarrhea or consitpation Genitourinary: Negative for dysuria, urgency, frequency, hematuria Musculoskeletal: Negative for back pain or joint pain Neurological: Negative for dizziness, seizures, syncope, focal weakness,  numbness and headaches.  Hematological: Does not bruise/bleed easily.  Psychiatric/Behavioral: He has had some hallucinations in the past few days.   Tolerating Diet: yes      DRUG ALLERGIES:  No Known Allergies  VITALS:  Blood pressure 128/63, pulse (!) 103, temperature (!) 97.5 F (36.4 C), temperature source Oral, resp. rate (!) 22, height _0  (1.753 m), weight 97.5 kg (215 lb), SpO2 97 %.  PHYSICAL EXAMINATION:  Constitutional: Appears well-developed and well-nourished. No distress. HENT: Normocephalic. Marland Kitchen Oropharynx is clear and moist.  Eyes: Conjunctivae and EOM are normal. PERRLA, no scleral icterus.  Neck: Normal ROM. Neck supple. No JVD. No tracheal deviation. CVS: RRR, S1/S2 +, no murmurs, no gallops, no carotid bruit.  Pulmonary: Bilateral respiratory crackles without wheezing or rhonchi yes  Abdominal: Soft. BS +,  no distension, tenderness, rebound or guarding.  Musculoskeletal: Normal range of motion. No edema and no tenderness.  Neuro: Alert. CN 2-12 grossly intact. No focal deficits. Skin: Skin is warm and dry. No  rash noted. Psychiatric: Normal mood and affect.      LABORATORY PANEL:   CBC Recent Labs  Lab 04/02/17 0456  WBC 6.0  HGB 11.4*  HCT 35.0*  PLT 128*   ------------------------------------------------------------------------------------------------------------------  Chemistries  Recent Labs  Lab 04/01/17 0749  04/02/17 0823  NA 147*   < > 145  K 4.3   < > 5.4*  CL 98*   < > 97*  CO2 39*   < > 36*  GLUCOSE 105*   < > 113*  BUN 16   < > 22*  CREATININE 1.31*   < > 1.41*  CALCIUM 9.2   < > 9.4  AST 28  --   --   ALT 14*  --   --   ALKPHOS 81  --   --   BILITOT 0.8  --   --    < > = values in this interval not displayed.   ------------------------------------------------------------------------------------------------------------------  Cardiac Enzymes Recent Labs  Lab 04/01/17 0749  TROPONINI <0.03   ------------------------------------------------------------------------------------------------------------------  RADIOLOGY:  Dg Chest 1 View  Result Date: 04/02/2017 CLINICAL DATA:  Shortness of breath since yesterday. EXAM: CHEST 1 VIEW COMPARISON:  04/01/2017 and CT 04/01/2017 FINDINGS: Lungs are hypoinflated with linear atelectasis/scarring over the left base. The small left effusion with basilar atelectasis seen on the recent CT is not well visualized on today's radiograph mild stable cardiomegaly. Remainder the exam is unchanged. IMPRESSION: Hyperinflation without acute cardiopulmonary disease. Small left effusions/atelectasis seen on CT yesterday is not well visualized. Mild stable cardiomegaly. Electronically Signed   By: Marin Olp M.D.   On: 04/02/2017 09:28   Ct Head Wo Contrast  Result Date: 04/01/2017 CLINICAL DATA:  Mental status changes.  EXAM: CT HEAD WITHOUT CONTRAST TECHNIQUE: Contiguous axial images were obtained from the base of the skull through the vertex without intravenous contrast. COMPARISON:  None. FINDINGS: Brain: The ventricles are  normal in size and configuration. No extra-axial fluid collections are identified. The gray-white differentiation is normal. No CT findings for acute intracranial process such as hemorrhage or infarction. 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 No mass lesions. The brainstem and cerebellum are grossly normal. Vascular: Vascular calcifications but no definite aneurysm or hyperdense vessels. Skull: No skull fracture or bone lesion. Sinuses/Orbits: The paranasal sinuses and mastoid air cells are clear. The globes are intact. Other: No scalp lesions or hematoma. IMPRESSION: No acute intracranial findings or mass lesion. Electronically Signed   By: Marijo Sanes M.D.   On: 04/01/2017 10:57   Ct Angio Chest Pe W Or Wo Contrast  Result Date: 04/01/2017 CLINICAL DATA:  Shortness of breath.  Leg swelling. EXAM: CT ANGIOGRAPHY CHEST WITH CONTRAST TECHNIQUE: Multidetector CT imaging of the chest was performed using the standard protocol during bolus administration of intravenous contrast. Multiplanar CT image reconstructions and MIPs were obtained to evaluate the vascular anatomy. CONTRAST:  66m ISOVUE-370 IOPAMIDOL (ISOVUE-370) INJECTION 76% COMPARISON:  Plain films of earlier today.  CT of 01/25/2017. FINDINGS: Cardiovascular: The quality of this exam for evaluation of pulmonary embolism is moderate. The bolus is centered in the SVC. No pulmonary embolism to the large segmental level Aortic atherosclerosis. Tortuous thoracic aorta. Mild cardiomegaly with lipomatous hypertrophy of the interatrial septum. No pericardial effusion. Mediastinum/Nodes: No mediastinal or hilar adenopathy. Lungs/Pleura: New small left pleural effusion. Left hemidiaphragm elevation with adjacent volume loss and passive atelectasis. Calcified granulomas at the right lung base. Mucoid impaction within right lower lobe subsegmental bronchi, including on image 66/ series 6. Similar. Re- demonstration of  volume loss in the right middle lobe. Minimal left upper lobe clustered "Tree-in-bud" nodularity, including on image 26/ series 6, similar. Similar subtle findings identified in the right upper and right lower lobes. Upper Abdomen: Normal imaged portions of the liver, spleen, stomach, pancreas. Cholecystectomy. Normal imaged adrenal glands and right kidney. Posterior upper pole left renal cyst. An anterolateral left upper pole 2.9 cm lesion measures greater than fluid density, including on image 93/series 4. Is similar in size back on 05/15/2013 Musculoskeletal: Moderate to marked bilateral gynecomastia. Probable sebaceous cyst about the anterior right chest wall at 10 mm. Osteopenia. Review of the MIP images confirms the above findings. IMPRESSION: 1. No evidence of pulmonary embolism. Mild limitations as detailed above. 2. New small left pleural effusion. Given the clinical history, this could represent fluid overload. 3. Similar appearance of the lungs, with left hemidiaphragm elevation and adjacent passive atelectasis. Subtle "Tree-in-bud" nodularity again identified, suggestive of infectious bronchiolitis. 4.  Aortic Atherosclerosis (ICD10-I70.0). 5. Left renal lesion which is technically indeterminate but favored to represent a minimally complex cyst, given size stability relative to 05/16/2003. Electronically Signed   By: KAbigail MiyamotoM.D.   On: 04/01/2017 14:41   UK[place]Venous Img Lower Unilateral Right  Result Date: 04/01/2017 CLINICAL DATA:  Right leg swelling for 2 years. Short of breath. Worsening over the last 3 weeks. EXAM: RIGHT LOWER EXTREMITY VENOUS DUPLEX ULTRASOUND TECHNIQUE: Doppler venous assessment of the right lower extremity deep venous system was performed, including characterization of spectral flow, compressibility, and phasicity. COMPARISON:  None. FINDINGS: There is complete compressibility of the right common femoral, femoral, and popliteal veins. Doppler analysis demonstrates  respiratory phasicity  and augmentation of flow with calf compression. No obvious superficial vein or calf vein thrombosis. IMPRESSION: No evidence of right lower extremity DVT. Electronically Signed   By: Marybelle Killings M.D.   On: 04/01/2017 08:39   Dg Chest Portable 1 View  Result Date: 04/01/2017 CLINICAL DATA:  Short of breath EXAM: PORTABLE CHEST 1 VIEW COMPARISON:  02/09/2017 FINDINGS: Bibasilar airspace disease left greater than right unchanged. Negative for heart failure or effusion. 1 cm nodular density overlying the left fourth rib posteriorly compatible with a bone island in the rib. IMPRESSION: Bibasilar atelectasis unchanged. Electronically Signed   By: Franchot Gallo M.D.   On: 04/01/2017 08:12     ASSESSMENT AND PLAN:   83 year old male with history of COPD and MAC on oral treatment who presents with shortness of breath.  1. Acute on chronic hypoxic respiratory failure due to underlying lung disease and an before meals Patient will need at least 3-4 L of oxygen upon discharge from his baseline 2-3 L. Patient was ruled out for pulmonary emboli Patient has chronically elevated diaphragm Pulmonary consultation pending  2. Acute COPD exacerbation: Patient without wheezing this morning Continue oral steroids  3. Recent diagnosis of MAC: Continue rifampin, azithromycin, ethambutol ID consultation requested  4. Chronic systolic heart failure ejection fraction 40-50%: Patient does not have signs of exacerbation at this time.  5. Chronic kidney disease stage III: Creatinine is at baseline  6. Lactic acidosis: Monitor lactic acid level 7. Hyperkalemia: Treated this when he takes a, insulin and dextrose. Repeat BMP pending.  Management plans discussed with the patient and family and they are in agreement.  CODE STATUS: full  TOTAL TIME TAKING CARE OF THIS PATIENT: 30 minutes.     POSSIBLE D/C 2-4 days, DEPENDING ON CLINICAL CONDITION.   Andreah Goheen M.D on 04/02/2017 at  10:53 AM  Between 7am to 6pm - Pager - [phone] After 6pm go to [email or web address] - password [email or web address] [facility]  Office  [phone]  CC: Primary care physician; [name]., MD  Note: This dictation was prepared with Dragon dictation along with smaller phrase technology. Any transcriptional errors that result from this process are unintentional.

## 2017-04-02 NOTE — Evaluation (Signed)
Physical Therapy Evaluation Patient Details Name: Jack Jenkins A Behrend MRN: [id number] DOB: [birth year] Today's Date: 04/02/2017   History of Present Illness  Pt admitted for COPD. Pt with complaints of SOB upon arrival. History includes CHF, COPD, cyocobacterium avium infection, gout, and CKD. Pt with recent bronchoscopy on 10/2.   Clinical Impression  Pt is a pleasant 83 year old male who was admitted for COPD. Pt performs bed mobility with supervision, transfers with cga and RW, and ambulation with cga and RW. Pt encouraged to continue to use RW as pt demonstrates poor balance without AD. All mobility performed on 2L of O2. Pt demonstrates deficits with strength/balance. Pt with limited insight to mobility deficits. Would benefit from skilled PT to address above deficits and promote optimal return to PLOF. Recommend transition to HHPT upon discharge from acute hospitalization.       Follow Up Recommendations Home health PT    Equipment Recommendations  None recommended by PT    Recommendations for Other Services       Precautions / Restrictions Precautions Precautions: Fall Restrictions Weight Bearing Restrictions: No      Mobility  Bed Mobility Overal bed mobility: Needs Assistance Bed Mobility: Supine to Sit     Supine to sit: Supervision     General bed mobility comments: safe technique performed. Slight posterior leaning noted once seated, however able to self correct.  Transfers Overall transfer level: Needs assistance Equipment used: Rolling walker (2 wheeled) Transfers: Sit to/from Stand Sit to Stand: Min guard         General transfer comment: Received pt in standing without AD, very unsteady. Once given RW, pt with improved balance.  Ambulation/Gait Ambulation/Gait assistance: Min guard Ambulation Distance (Feet): 200 Feet Assistive device: Rolling walker (2 wheeled) Gait Pattern/deviations: Step-through pattern     General Gait Details: ambulated with RW  with reciprocal gait pattern performed. Good gait speed noted. Needs cues for sequencing and obstacle avoidance. All mobility performed on 2L of O2 with sats slightly decreasing to 88%, however improving to 92% with rest break. Chair follow for safety.  Stairs            Wheelchair Mobility    Modified Rankin (Stroke Patients Only)       Balance Overall balance assessment: Needs assistance Sitting-balance support: Feet supported Sitting balance-Leahy Scale: Good     Standing balance support: No upper extremity supported Standing balance-Leahy Scale: Poor Standing balance comment: heavy post leaning noted                             Pertinent Vitals/Pain Pain Assessment: No/denies pain    Home Living Family/patient expects to be discharged to:: Private residence Living Arrangements: Spouse/significant other Available Help at Discharge: Family Type of Home: House Home Access: Level entry     Home Layout: One level Home Equipment: Environmental consultantWalker - 2 wheels;Walker - standard;Wheelchair - manual      Prior Function Level of Independence: Independent with assistive device(s)         Comments: uses SPC for all mobility, occasionally uses WC for long distances     Hand Dominance        Extremity/Trunk Assessment   Upper Extremity Assessment Upper Extremity Assessment: Overall WFL for tasks assessed    Lower Extremity Assessment Lower Extremity Assessment: Generalized weakness(B LE grossly 4+/5)       Communication   Communication: No difficulties  Cognition Arousal/Alertness: Awake/alert Behavior During Therapy: [facility]  for tasks assessed/performed Overall Cognitive Status: Within Functional Limits for tasks assessed                                        General Comments      Exercises Other Exercises Other Exercises: Received pt on BSC. Pt able to perform self hygiene with safe technique although needs cues for sequencing. Once  standing, pt very unsteady without AD, ambulated [facility]. Needs cues for sequencing as he accidently sits on IV cord. Further mobility performed with RW   Assessment/Plan    PT Assessment Patient needs continued PT services  PT Problem List Decreased strength;Decreased balance;Decreased mobility       PT Treatment Interventions Gait training;Therapeutic exercise;Balance training    PT Goals (Current goals can be found in the Care Plan section)  Acute Rehab PT Goals Patient Stated Goal: to go home PT Goal Formulation: With patient Time For Goal Achievement: 04/16/17 Potential to Achieve Goals: Good    Frequency Min 2X/week   Barriers to discharge        Co-evaluation               AM-PAC PT "6 Clicks" Daily Activity  Outcome Measure Difficulty turning over in bed (including adjusting bedclothes, sheets and blankets)?: None Difficulty moving from lying on back to sitting on the side of the bed? : None Difficulty sitting down on and standing up from a chair with arms (e.g., wheelchair, bedside commode, etc,.)?: Unable Help needed moving to and from a bed to chair (including a wheelchair)?: A Little Help needed walking in hospital room?: A Little Help needed climbing 3-5 steps with a railing? : A Little 6 Click Score: 18    End of Session Equipment Utilized During Treatment: Gait belt Activity Tolerance: Patient tolerated treatment well Patient left: in bed;with bed alarm set Nurse Communication: Mobility status PT Visit Diagnosis: Unsteadiness on feet (R26.81);Muscle weakness (generalized) (M62.81);Difficulty in walking, not elsewhere classified (R26.2)    Time: [id number] PT Time Calculation (min) (ACUTE ONLY): 26 min   Charges:   PT Evaluation $PT Eval Low Complexity: 1 Low PT Treatments $Gait Training: 8-22 mins   PT G Codes:   PT G-Codes **NOT FOR INPATIENT CLASS** Functional Assessment Tool Used: AM-PAC 6 Clicks Basic Mobility Functional  Limitation: Mobility: Walking and moving around Mobility: Walking and Moving Around Current Status ([id number]): At least 40 percent but less than 60 percent impaired, limited or restricted Mobility: Walking and Moving Around Goal Status [phone]): At least 20 percent but less than 40 percent impaired, limited or restricted    Elizabeth PalauStephanie Lakely Elmendorf, PT, DPT [phone]   Hira Trent 04/02/2017, 4:53 PM

## 2017-04-02 NOTE — Progress Notes (Signed)
Initial Nutrition Assessment  DOCUMENTATION CODES:   Obesity unspecified  INTERVENTION:   Recommend check phosphorus labs  Nepro Shake po BID, each supplement provides 425 kcal and 19 grams protein  MVI  NUTRITION DIAGNOSIS:   Increased nutrient needs related to catabolic illness(COPD, CHF) as evidenced by increased estimated needs from protein.  GOAL:   Patient will meet greater than or equal to 90% of their needs  MONITOR:   PO intake, Supplement acceptance, Labs, Weight trends, I & O's  REASON FOR ASSESSMENT:   Consult Assessment of nutrition requirement/status  ASSESSMENT:   83 y/o male with h/o COPD, CHF, CKD III, mycobacterium avium presents with SOB   Met with pt in room today. Pt reports poor appetite and oral intake for the past 2 months. Pt reports his appetite is improved today; pt ate 100% of his breakfast. Per chart, pt is weight stable. RD discussed the importance of adequate protein intake with COPD with pt. Pt does not drink supplements at home but is willing to try Nepro. RD will add Nepro and MVI. Pt with elevated potassium today; recommend check phosphorus.   Medications reviewed and include: aspirin, azithromycin, plavix, colace, heparin, NaCl @50ml /hr  Labs reviewed: K 5.4(H), Cl 97(H), BUN 22(H), creat 1.41(H)  Nutrition-Focused physical exam completed. Findings are no fat depletion, moderate muscle depletions in temporal regions, and mild edema BLE.   Diet Order:  Diet Heart Room service appropriate? Yes; Fluid consistency: Thin  EDUCATION NEEDS:   Education needs have been addressed  Skin:   Reviewed RN Assessment  Last BM:  11/21  Height:   Ht Readings from Last 1 Encounters:  04/01/17 5' 9"  (1.753 m)    Weight:   Wt Readings from Last 1 Encounters:  04/02/17 215 lb (97.5 kg)    Ideal Body Weight:  72.7 kg  BMI:  Body mass index is 31.75 kg/m.  Estimated Nutritional Needs:   Kcal:  2000-2300kcal/day   Protein:   99-118g/day  Fluid:  >2L/day   Koleen Distance MS, RD, LDN Pager #[phone] After Hours Pager: [phone]

## 2017-04-03 LAB — BASIC METABOLIC PANEL
ANION GAP: 11 (ref 5–15)
BUN: 24 mg/dL — ABNORMAL HIGH (ref 6–20)
CHLORIDE: 98 mmol/L — AB (ref 101–111)
CO2: 35 mmol/L — AB (ref 22–32)
Calcium: 8.7 mg/dL — ABNORMAL LOW (ref 8.9–10.3)
Creatinine, Ser: 1.39 mg/dL — ABNORMAL HIGH (ref 0.61–1.24)
GFR calc non Af Amer: 45 mL/min — ABNORMAL LOW (ref >60)
GFR, EST AFRICAN AMERICAN: 52 mL/min — AB (ref >60)
Glucose, Bld: 124 mg/dL — ABNORMAL HIGH (ref 65–99)
POTASSIUM: 4.6 mmol/L (ref 3.5–5.1)
Sodium: 144 mmol/L (ref 135–145)

## 2017-04-03 LAB — BLOOD GAS, VENOUS
Acid-Base Excess: 13.1 mmol/L — ABNORMAL HIGH (ref 0.0–2.0)
Bicarbonate: 43.8 mmol/L — ABNORMAL HIGH (ref 20.0–28.0)
FIO2: 0.28
O2 Saturation: 88.2 %
PCO2 VEN: 83 mmHg — AB (ref 44.0–60.0)
PO2 VEN: 59 mmHg — AB (ref 32.0–45.0)
Patient temperature: 37
pH, Ven: 7.33 (ref 7.250–7.430)

## 2017-04-03 LAB — GLUCOSE, CAPILLARY
GLUCOSE-CAPILLARY: 131 mg/dL — AB (ref 65–99)
Glucose-Capillary: 93 mg/dL (ref 65–99)

## 2017-04-03 LAB — MRSA PCR SCREENING: MRSA BY PCR: NEGATIVE

## 2017-04-03 LAB — LACTIC ACID, PLASMA: Lactic Acid, Venous: 1.5 mmol/L (ref 0.5–1.9)

## 2017-04-03 MED ORDER — POLYVINYL ALCOHOL 1.4 % OP SOLN
1.0000 [drp] | OPHTHALMIC | Status: DC | PRN
  Filled 2017-04-03: qty 15

## 2017-04-03 NOTE — Progress Notes (Addendum)
Pt with increased confusion.  Pt thinks more time has passed than actual has.  Pt insistent on speaking with his wife and is unable to dial the phone himself. Wrote out phone number for his wife in large letters and left at bedside. Called pt's wife and let her know that he wants to speak to her.  Wife on phone with pt. Henriette CombsSarah Tykera Skates RN [phone] Wife and son came to unit to speak with pt.  Instructed wife/son on plan of care- ID, Pulmonary consults, duonebs, oral steroids, fluids, labs (BMP for lactic acid and K+).  Wife questioned if new antibiotic Rifampin. Henriette CombsSarah Brendan Gruwell RN

## 2017-04-03 NOTE — Progress Notes (Signed)
[facility] [place] NAME: Jack Jenkins    MR#:  [id number]  DATE OF BIRTH:  Mar 15, 1931  SUBJECTIVE:   Patient with confusion this am VBG shows CO2=80 this am He is ok with Bipap Family at [place]:    Review of Systems  Constitutional: Negative for fever, chills weight loss HENT: Negative for ear pain, nosebleeds, congestion, facial swelling, rhinorrhea, neck pain, neck stiffness and ear discharge.   Respiratory: Positive for cough and shortness of breath Cardiovascular: Negative for chest pain, palpitations and leg swelling.  Gastrointestinal: Negative for heartburn, abdominal pain, vomiting, diarrhea or consitpation Genitourinary: Negative for dysuria, urgency, frequency, hematuria Musculoskeletal: Negative for back pain or joint pain Neurological: Negative for dizziness, seizures, syncope, focal weakness,  numbness and headaches.  Hematological: Does not bruise/bleed easily.  Psychiatric/Behavioral: He has had some hallucinations this am and confusion  Tolerating Diet: yes      DRUG ALLERGIES:  No Known Allergies  VITALS:  Blood pressure (!) 112/56, pulse (!) 106, temperature 97.7 F (36.5 C), temperature source Oral, resp. rate (!) 21, height _0  (1.753 m), weight 100.5 kg (221 lb 9 oz), SpO2 97 %.  PHYSICAL EXAMINATION:  Constitutional: Appears well-developed and well-nourished. No distress. HENT: Normocephalic. Marland Kitchen Oropharynx is clear and moist.  Eyes: Conjunctivae and EOM are normal. PERRLA, no scleral icterus.  Neck: Normal ROM. Neck supple. No JVD. No tracheal deviation. CVS: RRR, S1/S2 +, no murmurs, no gallops, no carotid bruit.  Pulmonary: Bilateral respiratory crackles without wheezing or rhonchi yes  Abdominal: Soft. BS +,  no distension, tenderness, rebound or guarding.  Musculoskeletal: Normal range of motion. No edema and no tenderness.  Neuro: Alert. CN 2-12 grossly intact. No focal  deficits. Skin: Skin is warm and dry. No rash noted. Psychiatric: Normal mood and affect.      LABORATORY PANEL:   CBC Recent Labs  Lab 04/02/17 0456  WBC 6.0  HGB 11.4*  HCT 35.0*  PLT 128*   ------------------------------------------------------------------------------------------------------------------  Chemistries  Recent Labs  Lab 04/01/17 0749  04/03/17 0415  NA 147*   < > 144  K 4.3   < > 4.6  CL 98*   < > 98*  CO2 39*   < > 35*  GLUCOSE 105*   < > 124*  BUN 16   < > 24*  CREATININE 1.31*   < > 1.39*  CALCIUM 9.2   < > 8.7*  AST 28  --   --   ALT 14*  --   --   ALKPHOS 81  --   --   BILITOT 0.8  --   --    < > = values in this interval not displayed.   ------------------------------------------------------------------------------------------------------------------  Cardiac Enzymes Recent Labs  Lab 04/01/17 0749  TROPONINI <0.03   ------------------------------------------------------------------------------------------------------------------  RADIOLOGY:  Dg Chest 1 View  Result Date: 04/02/2017 CLINICAL DATA:  Shortness of breath since yesterday. EXAM: CHEST 1 VIEW COMPARISON:  04/01/2017 and CT 04/01/2017 FINDINGS: Lungs are hypoinflated with linear atelectasis/scarring over the left base. The small left effusion with basilar atelectasis seen on the recent CT is not well visualized on today's radiograph mild stable cardiomegaly. Remainder the exam is unchanged. IMPRESSION: Hyperinflation without acute cardiopulmonary disease. Small left effusions/atelectasis seen on CT yesterday is not well visualized. Mild stable cardiomegaly. Electronically Signed   By: Marin Olp M.D.   On: 04/02/2017 09:28   Ct Head Wo Contrast  Result Date: 04/01/2017 CLINICAL DATA:  Mental status changes. EXAM: CT HEAD WITHOUT CONTRAST TECHNIQUE: Contiguous axial images were obtained from the base of the skull through the vertex without intravenous contrast. COMPARISON:   None. FINDINGS: Brain: The ventricles are normal in size and configuration. No extra-axial fluid collections are identified. The gray-white differentiation is normal. No CT findings for acute intracranial process such as hemorrhage or infarction. 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 No mass lesions. The brainstem and cerebellum are grossly normal. Vascular: Vascular calcifications but no definite aneurysm or hyperdense vessels. Skull: No skull fracture or bone lesion. Sinuses/Orbits: The paranasal sinuses and mastoid air cells are clear. The globes are intact. Other: No scalp lesions or hematoma. IMPRESSION: No acute intracranial findings or mass lesion. Electronically Signed   By: Marijo Sanes M.D.   On: 04/01/2017 10:57   Ct Angio Chest Pe W Or Wo Contrast  Result Date: 04/01/2017 CLINICAL DATA:  Shortness of breath.  Leg swelling. EXAM: CT ANGIOGRAPHY CHEST WITH CONTRAST TECHNIQUE: Multidetector CT imaging of the chest was performed using the standard protocol during bolus administration of intravenous contrast. Multiplanar CT image reconstructions and MIPs were obtained to evaluate the vascular anatomy. CONTRAST:  69m ISOVUE-370 IOPAMIDOL (ISOVUE-370) INJECTION 76% COMPARISON:  Plain films of earlier today.  CT of 01/25/2017. FINDINGS: Cardiovascular: The quality of this exam for evaluation of pulmonary embolism is moderate. The bolus is centered in the SVC. No pulmonary embolism to the large segmental level Aortic atherosclerosis. Tortuous thoracic aorta. Mild cardiomegaly with lipomatous hypertrophy of the interatrial septum. No pericardial effusion. Mediastinum/Nodes: No mediastinal or hilar adenopathy. Lungs/Pleura: New small left pleural effusion. Left hemidiaphragm elevation with adjacent volume loss and passive atelectasis. Calcified granulomas at the right lung base. Mucoid impaction within right lower lobe subsegmental bronchi, including on image  66/ series 6. Similar. Re- demonstration of volume loss in the right middle lobe. Minimal left upper lobe clustered "Tree-in-bud" nodularity, including on image 26/ series 6, similar. Similar subtle findings identified in the right upper and right lower lobes. Upper Abdomen: Normal imaged portions of the liver, spleen, stomach, pancreas. Cholecystectomy. Normal imaged adrenal glands and right kidney. Posterior upper pole left renal cyst. An anterolateral left upper pole 2.9 cm lesion measures greater than fluid density, including on image 93/series 4. Is similar in size back on 05/15/2013 Musculoskeletal: Moderate to marked bilateral gynecomastia. Probable sebaceous cyst about the anterior right chest wall at 10 mm. Osteopenia. Review of the MIP images confirms the above findings. IMPRESSION: 1. No evidence of pulmonary embolism. Mild limitations as detailed above. 2. New small left pleural effusion. Given the clinical history, this could represent fluid overload. 3. Similar appearance of the lungs, with left hemidiaphragm elevation and adjacent passive atelectasis. Subtle "Tree-in-bud" nodularity again identified, suggestive of infectious bronchiolitis. 4.  Aortic Atherosclerosis (ICD10-I70.0). 5. Left renal lesion which is technically indeterminate but favored to represent a minimally complex cyst, given size stability relative to 05/16/2003. Electronically Signed   By: KAbigail MiyamotoM.D.   On: 04/01/2017 14:41     ASSESSMENT AND PLAN:   83year old male with history of COPD and MAC on oral treatment who presents with shortness of breath.  1. Acute on chronic hypoxic respiratory failure due to underlying lung disease and an before meals Patient will need at least 3-4 L of oxygen upon discharge from his baseline 2-3 L. Patient was ruled out for pulmonary emboli Patient has chronically elevated diaphragm Pulmonary consultation  pending  2. Acute COPD exacerbation with CO2 retention Patient will need  BiPAP overnight Consider IV steroids Continue nebs Discuss case with intensivist  3. Recent diagnosis of MAC: Continue rifampin, azithromycin, ethambutol ID consultation requested for Monday.  4. Chronic systolic heart failure ejection fraction 40-50%: Patient does not have signs of exacerbation at this time.  5. Chronic kidney disease stage III: Creatinine is at baseline  6. Lactic acidosis: This has resolved. 7. Hyperkalemia: This has resolved.  8. Acute metabolic encephalopathy in the setting of CO2 retention with PCO2 of 83 Patient will be transferred to stepdown on BiPAP   Management plans discussed with the patient and family and they are in agreement. Case discussed with intensivist on-call CODE STATUS: full  Critical care TOTAL TIME TAKING CARE OF THIS PATIENT: 30 minutes.     POSSIBLE D/C 3- 4 days, DEPENDING ON CLINICAL CONDITION.   Eisley Barber M.D on 04/03/2017 at 10:25 AM  Between 7am to 6pm - Pager - [phone] After 6pm go to [email or web address] - password [email or web address] [facility]  Office  [phone]  CC: Primary care physician; [name]., MD  Note: This dictation was prepared with Dragon dictation along with smaller phrase technology. Any transcriptional errors that result from this process are unintentional.

## 2017-04-03 NOTE — Therapy (Signed)
ABG results called to Dr. Harland GermanGutpa at 1250p.  Orders received to continue BIPAP until supper.  Allow the patient to eat supper and then resume BIPAP until in the AM of 04/04/17

## 2017-04-03 NOTE — Plan of Care (Signed)
Family with good understanding of general education and medication.  Pt is confused and unable to understand plan of care, at times unable to recall reason for admission. Pt still requiring additional oxygenation, cyanosis to mouth and labored breathing/pursed lip breathing with any exertion. Henriette CombsSarah Trevonne Nyland RN

## 2017-04-04 DIAGNOSIS — R410 Disorientation, unspecified: Secondary | ICD-10-CM

## 2017-04-04 LAB — BLOOD GAS, ARTERIAL
ACID-BASE EXCESS: 14.8 mmol/L — AB (ref 0.0–2.0)
BICARBONATE: 41.4 mmol/L — AB (ref 20.0–28.0)
FIO2: 0.32
O2 SAT: 89.8 %
PATIENT TEMPERATURE: 37
pCO2 arterial: 61 mmHg — ABNORMAL HIGH (ref 32.0–48.0)
pH, Arterial: 7.44 (ref 7.350–7.450)
pO2, Arterial: 56 mmHg — ABNORMAL LOW (ref 83.0–108.0)

## 2017-04-04 LAB — GLUCOSE, CAPILLARY: GLUCOSE-CAPILLARY: 88 mg/dL (ref 65–99)

## 2017-04-04 MED ORDER — HALOPERIDOL LACTATE 5 MG/ML IJ SOLN
2.0000 mg | Freq: Four times a day (QID) | INTRAMUSCULAR | Status: DC | PRN
  Administered 2017-04-04: 2 mg via INTRAVENOUS
  Filled 2017-04-04: qty 1

## 2017-04-04 MED ORDER — SODIUM CHLORIDE 0.9 % IV BOLUS (SEPSIS)
500.0000 mL | Freq: Once | INTRAVENOUS | Status: AC
  Administered 2017-04-05: 500 mL via INTRAVENOUS

## 2017-04-04 MED ORDER — DEXMEDETOMIDINE HCL IN NACL 400 MCG/100ML IV SOLN
0.4000 ug/kg/h | INTRAVENOUS | Status: DC
  Administered 2017-04-04: 0.4 ug/kg/h via INTRAVENOUS
  Filled 2017-04-04: qty 100

## 2017-04-04 NOTE — Progress Notes (Signed)
eLink Physician-Brief Progress Note Patient Name: Starleen BlueGrady A Bernardi DOB: [birth year] MRN: [id number]   Date of Service  04/04/2017  HPI/Events of Note  Hypotensive Sleeping on bipap  eICU Interventions  minimise precedex NS bolus 500     Intervention Category Major Interventions: Hypotension - evaluation and management  Ferol Laiche V. Brooke Steinhilber 04/04/2017, 11:59 PM

## 2017-04-04 NOTE — Progress Notes (Signed)
Assumed care of patient at 15:20. Report received from [facility] RN. Spoke with Dr Chales AbrahamsGupta regarding starting patient lasix and stopping fluids. At this point Dr Chales AbrahamsGupta is not going to resume lasix and we will continue Normal saline.

## 2017-04-04 NOTE — Progress Notes (Signed)
Called Dr Chales AbrahamsGupta patient increasingly confused. Pulled bi-pap off. Placed nasal cannula back on. Patient becomes combative on and off. Family believes that Haldol made his agitation worse. This nurse requested ABG. Per Dr Chales AbrahamsGupta no ABG order at this time. He did give orders for precedex if needed.

## 2017-04-04 NOTE — Progress Notes (Signed)
Pt impulsive up on side of bed, agitated and wanting to get on BSC. After bm, back to bed, increased sob and says "I'm not getting enough oxygen."  Increased [place] to 5L temporarily. Repositioned in bed.  Lurene ShadowBAllen, RN

## 2017-04-05 DIAGNOSIS — J9622 Acute and chronic respiratory failure with hypercapnia: Secondary | ICD-10-CM

## 2017-04-05 DIAGNOSIS — J9621 Acute and chronic respiratory failure with hypoxia: Secondary | ICD-10-CM

## 2017-04-05 LAB — BASIC METABOLIC PANEL
ANION GAP: 3 — AB (ref 5–15)
BUN: 22 mg/dL — AB (ref 6–20)
CHLORIDE: 105 mmol/L (ref 101–111)
CO2: 35 mmol/L — ABNORMAL HIGH (ref 22–32)
Calcium: 8.3 mg/dL — ABNORMAL LOW (ref 8.9–10.3)
Creatinine, Ser: 1.24 mg/dL (ref 0.61–1.24)
GFR calc Af Amer: 60 mL/min — ABNORMAL LOW (ref >60)
GFR, EST NON AFRICAN AMERICAN: 52 mL/min — AB (ref >60)
Glucose, Bld: 99 mg/dL (ref 65–99)
POTASSIUM: 4.1 mmol/L (ref 3.5–5.1)
SODIUM: 143 mmol/L (ref 135–145)

## 2017-04-05 LAB — MAGNESIUM: MAGNESIUM: 2.5 mg/dL — AB (ref 1.7–2.4)

## 2017-04-05 LAB — COMPREHENSIVE METABOLIC PANEL
ALBUMIN: 2.6 g/dL — AB (ref 3.5–5.0)
ALT: 21 U/L (ref 17–63)
AST: 36 U/L (ref 15–41)
Alkaline Phosphatase: 55 U/L (ref 38–126)
Anion gap: 3 — ABNORMAL LOW (ref 5–15)
BUN: 24 mg/dL — AB (ref 6–20)
CHLORIDE: 105 mmol/L (ref 101–111)
CO2: 35 mmol/L — ABNORMAL HIGH (ref 22–32)
Calcium: 8.2 mg/dL — ABNORMAL LOW (ref 8.9–10.3)
Creatinine, Ser: 1.31 mg/dL — ABNORMAL HIGH (ref 0.61–1.24)
GFR, EST AFRICAN AMERICAN: 56 mL/min — AB (ref >60)
GFR, EST NON AFRICAN AMERICAN: 48 mL/min — AB (ref >60)
Glucose, Bld: 93 mg/dL (ref 65–99)
POTASSIUM: 4.2 mmol/L (ref 3.5–5.1)
Sodium: 143 mmol/L (ref 135–145)
Total Bilirubin: 0.5 mg/dL (ref 0.3–1.2)
Total Protein: 4.9 g/dL — ABNORMAL LOW (ref 6.5–8.1)

## 2017-04-05 LAB — CBC WITH DIFFERENTIAL/PLATELET
BASOS ABS: 0 10*3/uL (ref 0–0.1)
BASOS PCT: 0 %
Eosinophils Absolute: 0.1 10*3/uL (ref 0–0.7)
Eosinophils Relative: 4 %
HEMATOCRIT: 30.6 % — AB (ref 40.0–52.0)
HEMOGLOBIN: 9.9 g/dL — AB (ref 13.0–18.0)
Lymphocytes Relative: 17 %
Lymphs Abs: 0.5 10*3/uL — ABNORMAL LOW (ref 1.0–3.6)
MCH: 31.4 pg (ref 26.0–34.0)
MCHC: 32.2 g/dL (ref 32.0–36.0)
MCV: 97.5 fL (ref 80.0–100.0)
Monocytes Absolute: 0.5 10*3/uL (ref 0.2–1.0)
Monocytes Relative: 16 %
NEUTROS ABS: 1.8 10*3/uL (ref 1.4–6.5)
NEUTROS PCT: 63 %
Platelets: 89 10*3/uL — ABNORMAL LOW (ref 150–440)
RBC: 3.14 MIL/uL — ABNORMAL LOW (ref 4.40–5.90)
RDW: 15.9 % — ABNORMAL HIGH (ref 11.5–14.5)
WBC: 2.9 10*3/uL — ABNORMAL LOW (ref 3.8–10.6)

## 2017-04-05 LAB — LACTIC ACID, PLASMA: LACTIC ACID, VENOUS: 0.6 mmol/L (ref 0.5–1.9)

## 2017-04-05 LAB — GLUCOSE, CAPILLARY: GLUCOSE-CAPILLARY: 72 mg/dL (ref 65–99)

## 2017-04-05 LAB — AMMONIA: Ammonia: 38 umol/L — ABNORMAL HIGH (ref 9–35)

## 2017-04-05 LAB — PHOSPHORUS: PHOSPHORUS: 4.5 mg/dL (ref 2.5–4.6)

## 2017-04-05 MED ORDER — SODIUM CHLORIDE 0.9 % IV BOLUS (SEPSIS)
500.0000 mL | Freq: Once | INTRAVENOUS | Status: AC
  Administered 2017-04-05: 500 mL via INTRAVENOUS

## 2017-04-05 MED ORDER — TIOTROPIUM BROMIDE MONOHYDRATE 18 MCG IN CAPS
18.0000 ug | ORAL_CAPSULE | Freq: Every day | RESPIRATORY_TRACT | Status: DC
  Administered 2017-04-05 – 2017-04-07 (×3): 18 ug via RESPIRATORY_TRACT
  Filled 2017-04-05: qty 5

## 2017-04-05 MED ORDER — SALINE SPRAY 0.65 % NA SOLN
1.0000 | NASAL | Status: DC | PRN
  Administered 2017-04-05: 23:00:00 1 via NASAL
  Filled 2017-04-05: qty 44

## 2017-04-05 MED ORDER — IPRATROPIUM-ALBUTEROL 0.5-2.5 (3) MG/3ML IN SOLN: 3.0000 mL | Freq: Four times a day (QID) | RESPIRATORY_TRACT | Status: DC | PRN

## 2017-04-05 NOTE — Progress Notes (Signed)
PT Cancellation Note  Patient Details Name: Jack Jenkins MRN: [id number] DOB: [birth year]   Cancelled Treatment:    Reason Eval/Treat Not Completed: Other (comment). Pt transferred to CCU on 11/24 secondary to increased SOB and confusion. Unable to tolerate bipap. Started on precedex secondary to agitation and confusion, however now with extremely low BP. Current order discontinued secondary to change in status. No new order received at this time. Please re-consult when medically stable.   Garrus Gauthreaux 04/05/2017, 8:14 AM  Elizabeth PalauStephanie Kaori Jumper, PT, DPT [phone]

## 2017-04-05 NOTE — Progress Notes (Signed)
[facility] at [place] NAME: Jack Jenkins    MR#:  [id number]  DATE OF BIRTH:  November 18, 1930  SUBJECTIVE:   Patient was on Bipap overnight Still confused  REVIEW OF SYSTEMS:    Review of Systems  Constitutional: Negative for fever, chills weight loss HENT: Negative for ear pain, nosebleeds, congestion, facial swelling, rhinorrhea, neck pain, neck stiffness and ear discharge.   Respiratory: Positive for cough and shortness of breath Cardiovascular: Negative for chest pain, palpitations and leg swelling.  Gastrointestinal: Negative for heartburn, abdominal pain, vomiting, diarrhea or consitpation Genitourinary: Negative for dysuria, urgency, frequency, hematuria Musculoskeletal: Negative for back pain or joint pain Neurological: Negative for dizziness, seizures, syncope, focal weakness,  numbness and headaches.  Hematological: Does not bruise/bleed easily.  Psychiatric/Behavioral: He has had some hallucinations this am and confusion no agitation reproted  Tolerating Diet: yes      DRUG ALLERGIES:  No Known Allergies  VITALS:  Blood pressure (!) 78/43, pulse 89, temperature 97.8 F (36.6 C), temperature source Axillary, resp. rate 17, height _0  (1.626 m), weight 103.4 kg (227 lb 15.3 oz), SpO2 95 %.  PHYSICAL EXAMINATION:  Constitutional: Appears well-developed and well-nourished. No distress. HENT: Normocephalic. Marland Kitchen Oropharynx is clear and moist.  Eyes: Conjunctivae and EOM are normal. PERRLA, no scleral icterus.  Neck: Normal ROM. Neck supple. No JVD. No tracheal deviation. CVS: RRR, S1/S2 +, no murmurs, no gallops, no carotid bruit.  Pulmonary: Bilateral respiratory crackles without wheezing or rhonchi   Abdominal: Soft. BS +,  no distension, tenderness, rebound or guarding.  Musculoskeletal: Normal range of motion. No edema and no tenderness.  Neuro: Alert. CN 2-12 grossly intact. No focal deficits. Skin: Skin is warm and dry. No  rash noted. Psychiatric: Patient appears confused this morning     LABORATORY PANEL:   CBC Recent Labs  Lab 04/05/17 0504  WBC 2.9*  HGB 9.9*  HCT 30.6*  PLT 89*   ------------------------------------------------------------------------------------------------------------------  Chemistries  Recent Labs  Lab 04/05/17 0504  NA 143  K 4.2  CL 105  CO2 35*  GLUCOSE 93  BUN 24*  CREATININE 1.31*  CALCIUM 8.2*  MG 2.5*  AST 36  ALT 21  ALKPHOS 55  BILITOT 0.5   ------------------------------------------------------------------------------------------------------------------  Cardiac Enzymes Recent Labs  Lab 04/01/17 0749  TROPONINI <0.03   ------------------------------------------------------------------------------------------------------------------  RADIOLOGY:  No results found.   ASSESSMENT AND PLAN:   83 year old male with history of COPD and MAC on oral treatment who presents with shortness of breath.  1. Acute on chronic hypoxic respiratory failure due to underlying lung disease and an before meals Patient will need at least 3-4 L of oxygen upon discharge from his baseline 2-3 L. Patient was ruled out for pulmonary emboli Patient has chronically elevated left diaphragm Family has requested patient be evaluated by his pulmonologist.  2. Acute COPD exacerbation with CO2 retention Patient's CO2 has improved with Bipap Continue nebs   3. Recent diagnosis of MAC: Continue rifampin, azithromycin, ethambutol ID consultation requested for Monday.  4. Chronic systolic heart failure ejection fraction 40-50%: Patient does not have signs of exacerbation at this time.  5. Chronic kidney disease stage III: Creatinine is at baseline  6. Lactic acidosis: This has resolved. 7. Hyperkalemia: This has resolved.  8. Acute metabolic encephalopathy in the setting of CO2 retention with PCO2 of 83 Patient was transferred to stepdown unit due to encephalopathy  from CO2 retention. He remains confused and agitated at times.  Family reports no underlying dementia. Check NH3/UA   Management plans discussed with nurisng  CODE STATUS: full  TOTAL TIME TAKING CARE OF THIS PATIENT: 24 minutes.     POSSIBLE D/C 3- 4 days, DEPENDING ON CLINICAL CONDITION.   Tazia Illescas M.D on 04/05/2017 at 7:44 AM  Between 7am to 6pm - Pager - [phone] After 6pm go to [email or web address] - password [email or web address] [facility]  Office  [phone]  CC: Primary care physician; [name]., MD  Note: This dictation was prepared with Dragon dictation along with smaller phrase technology. Any transcriptional errors that result from this process are unintentional.

## 2017-04-05 NOTE — Progress Notes (Signed)
Good day. Confusion has cleared for now. Completely oriented. MAE. Up in chair for 2 hours. Still short of breath with exertion. Family in and out.

## 2017-04-05 NOTE — Progress Notes (Signed)
PULMONARY / CRITICAL CARE MEDICINE   Name: Jack Jenkins MRN: [id number] DOB: 12-12-1930    ADMISSION DATE:  04/01/2017    SIGNIFICANT EVENTS: Patient seen and examined at bedside. Noted events overnight. Nursing reports patient intermittently agitated overnight.  PAST MEDICAL HISTORY :   has a past medical history of CHF (congestive heart failure) ([place]), COPD (chronic obstructive pulmonary disease) ([place]), Gout, and Renal disorder.  has a past surgical history that includes Cataract extraction (Bilateral); Cholecystectomy; Back surgery; Hernia repair; Polypectomy; Laminectomy; Upper gi endoscopy (01/15/04); and Flexible bronchoscopy (N/A, 02/09/2017).   Prior to Admission medications   Medication Sig Start Date End Date Taking? Authorizing Provider  allopurinol (ZYLOPRIM) 100 MG tablet Take 1 tablet (100 mg total) by mouth daily as needed (for gout). Patient taking differently: Take 100 mg by mouth daily.  10/20/16  Yes [name]., MD  aspirin EC 81 MG EC tablet Take 1 tablet (81 mg total) by mouth daily. 04/24/15  Yes Fritzi Mandes, MD  azithromycin (ZITHROMAX) 250 MG tablet Take 1 tablet by mouth 3 (three) times a week. 03/24/17  Yes [provider]  ethambutol (MYAMBUTOL) 400 MG tablet Take 1,200 mg by mouth 3 (three) times a week. 03/24/17  Yes [provider]  furosemide (LASIX) 40 MG tablet Take 1 tablet (40 mg total) by mouth daily as needed for fluid. 10/20/16  Yes [name]., MD  metoprolol succinate (TOPROL XL) 25 MG 24 hr tablet Take 1 tablet (25 mg total) by mouth daily. 10/20/16  Yes [name]., MD  rifampin (RIFADIN) 300 MG capsule Take 600 mg by mouth 3 (three) times a week. 02/16/17  Yes [provider]  tiotropium (SPIRIVA) 18 MCG inhalation capsule Place 1 capsule (18 mcg total) into inhaler and inhale daily. 04/24/15  Yes Fritzi Mandes, MD  clopidogrel (PLAVIX) 75 MG tablet Take 1 tablet (75 mg total) by mouth  daily. Patient not taking: Reported on 10/09/2016 10/08/16 10/08/17  Loletha Grayer, MD  dextromethorphan-guaiFENesin [facility] DM) 30-600 MG 12hr tablet Take 1 tablet by mouth 2 (two) times daily. Patient not taking: Reported on 02/08/2017 11/12/16   Chrismon, Vickki Muff, PA  mometasone-formoterol (DULERA) 100-5 MCG/ACT AERO Inhale 2 puffs into the lungs 2 (two) times daily. Patient not taking: Reported on 11/12/2016 04/24/15   Fritzi Mandes, MD  nitroGLYCERIN (NITROSTAT) 0.4 MG SL tablet Place 1 tablet (0.4 mg total) under the tongue every 5 (five) minutes as needed for chest pain. Patient not taking: Reported on 10/09/2016 10/08/16   Loletha Grayer, MD  ranitidine (ZANTAC) 150 MG tablet Take 1 tablet (150 mg total) by mouth 2 (two) times daily. Patient not taking: Reported on 04/01/2017 12/17/14   Carmon Ginsberg, PA   No Known Allergies    VITAL SIGNS: Temp:  [97.8 F (36.6 C)-98.4 F (36.9 C)] 97.8 F (36.6 C) (11/25 1923) Pulse Rate:  [63-116] 89 (11/26 0315) Resp:  [14-34] 17 (11/26 0315) BP: (64-154)/(31-95) 78/43 (11/26 0300) SpO2:  [87 %-100 %] 95 % (11/26 0315) FiO2 (%):  [40 %] 40 % (11/25 1956) Weight:  [220 lb 10.9 oz (100.1 kg)-227 lb 15.3 oz (103.4 kg)] 227 lb 15.3 oz (103.4 kg) (11/26 0317) HEMODYNAMICS:   VENTILATOR SETTINGS: FiO2 (%):  [40 %] 40 % INTAKE / OUTPUT:  Intake/Output Summary (Last 24 hours) at 04/05/2017 0451 Last data filed at 04/05/2017 0316 Gross per 24 hour  Intake 1570.66 ml  Output 800 ml  Net 770.66 ml  PHYSICAL EXAMINATION: General:  nad Neuro:  Moves all extremities, pleasantly confused HEENT:  [place], no jvd Cardiovascular:  s1 s2+ Lungs:  cta b/l Abdomen:  Soft, non tender, bs+  LABS:  CBC Recent Labs  Lab 04/01/17 0749 04/02/17 0456  WBC 3.8 6.0  HGB 12.1* 11.4*  HCT 38.3* 35.0*  PLT 126* 128*   Coag's No results for input(s): APTT, INR in the last 168 hours. BMET Recent Labs  Lab 04/02/17 0456 04/02/17 0823 04/03/17 0415   NA 143 145 144  K 5.4* 5.4* 4.6  CL 98* 97* 98*  CO2 36* 36* 35*  BUN 22* 22* 24*  CREATININE 1.43* 1.41* 1.39*  GLUCOSE 127* 113* 124*   Electrolytes Recent Labs  Lab 04/02/17 0456 04/02/17 0823 04/03/17 0415  CALCIUM 9.2 9.4 8.7*   Sepsis Markers Recent Labs  Lab 04/02/17 0823 04/02/17 1110 04/03/17 0843  LATICACIDVEN 2.0* 3.0* 1.5   ABG Recent Labs  Lab 04/04/17 0954  PHART 7.44  PCO2ART 61*  PO2ART 56*   Liver Enzymes Recent Labs  Lab 04/01/17 0749  AST 28  ALT 14*  ALKPHOS 81  BILITOT 0.8  ALBUMIN 3.6   Cardiac Enzymes Recent Labs  Lab 04/01/17 0749  TROPONINI <0.03   Glucose Recent Labs  Lab 04/02/17 0757 04/03/17 0725 04/03/17 1025 04/04/17 0713  GLUCAP 105* 93 131* 88    Imaging Reviewed independently   ASSESSMENT / PLAN: ALTERED MENTAL STATUS  ? ETIOLOGY DELIRIUM  ACUTE ON CHRONIC HYPERCAPNIC RESP FAILURE CHF CKD   PLAN: Will monitor mental status closely. Try precedex. Check ammonia level. Hold narcotics and other sedating meds. On BD. BIPAP prn. Monitor renal function closely. Will monitor in ICU. Aspiration precautions.   Dimas Chyle MD Pulmonary and Critical Care Medicine

## 2017-04-05 NOTE — Care Management Note (Signed)
Case Management Note  Patient Details  Name: Jack Jenkins MRN: [id number] Date of Birth: Mar 11, 1931  Subjective/Objective:                  RNCM met with patient and his wife to discuss home health physical therapy.  He plans to return home but not certain he wants home health.  He lives here in [place] in a [place] that has been modified to assist with any handicap needs (railings in shower/bathroom, wide doors to accommodate wheelchair if needed). He states at baseline he walks with a cane but has a walker available if needed. PCP is Dr. Miguel Aschoff. He denies problems obtaining medications.  He is on chronic O2 through [facility].   Action/Plan: Home health list provided for patient to review.  RNCM to follow.   Expected Discharge Date:                  Expected Discharge Plan:     In-House Referral:     Discharge planning Services     Post Acute Care Choice:  Home Health Choice offered to:  Patient, Spouse  DME Arranged:    DME Agency:     HH Arranged:    [place] Agency:     Status of Service:  In process, will continue to follow  If discussed at Long Length of Stay Meetings, dates discussed:    Additional Comments:  Marshell Garfinkel, RN 04/05/2017, 10:52 AM

## 2017-04-05 NOTE — Progress Notes (Signed)
Patient remains hypotensive. Patient received or NS over 1 hour. Precedex has been off. MD notified of minimal change in BP. MD instructed to give another bolus over 1 hour.

## 2017-04-05 NOTE — Progress Notes (Signed)
No distress on [place] O2 Cognition intact No new complaints  Vitals:   04/05/17 0701 04/05/17 0800 04/05/17 1000 04/05/17 1200  BP:  136/78 122/62 123/75  Pulse:  95 (!) 102 (!) 106  Resp:  (!) 21 (!) 30 (!) 22  Temp:  97.7 F (36.5 C)    TempSrc:      SpO2: 93% 96% 96% 96%  Weight:      Height:         Gen: WDWN in NAD HEENT: NCAT, sclerae white, oropharynx normal Neck: No LAN, no JVD noted Lungs: Diffusely diminished BS, normal percussion note, no wheezes Cardiovascular: Reg, no M noted Abdomen: Soft, NT, +BS Ext: no C/C/E Neuro: grossly intact Skin: No lesions noted   BMP Latest Ref Rng & Units 04/05/2017 04/05/2017 04/03/2017  Glucose 65 - 99 mg/dL 93 99 124(H)  BUN 6 - 20 mg/dL 24(H) 22(H) 24(H)  Creatinine 0.61 - 1.24 mg/dL 1.31(H) 1.24 1.39(H)  BUN/Creat Ratio 10 - 22 - - -  Sodium 135 - 145 mmol/L 143 143 144  Potassium 3.5 - 5.1 mmol/L 4.2 4.1 4.6  Chloride 101 - 111 mmol/L 105 105 98(L)  CO2 22 - 32 mmol/L 35(H) 35(H) 35(H)  Calcium 8.9 - 10.3 mg/dL 8.2(L) 8.3(L) 8.7(L)    CBC Latest Ref Rng & Units 04/05/2017 04/02/2017 04/01/2017  WBC 3.8 - 10.6 K/uL 2.9(L) 6.0 3.8  Hemoglobin 13.0 - 18.0 g/dL 9.9(L) 11.4(L) 12.1(L)  Hematocrit 40.0 - 52.0 % 30.6(L) 35.0(L) 38.3(L)  Platelets 150 - 440 K/uL 89(L) 128(L) 126(L)     CXR: No new film  IMP: Acute on chronic hypoxemic/hypercarbic respiratory failure  Clinically much improved Recent diagnosis of pulmonary MAC  PLAN/REC: Continue current Rx Transfer to med-surg with cardiac monitoring Resume Dulera and Spiriva (home meds) Change nebs to PRN only Continue supplemental O2 He is followed by Dr Bea Laura as outpt - I have informed Dr Humphrey Rolls that he is in hospital and Dr Humphrey Rolls will follow after transfer out of ICU/SDU  Merton Border, MD PCCM service Mobile [phone] Pager [phone] 04/05/2017 1:19 PM

## 2017-04-05 NOTE — Progress Notes (Signed)
[facility] [place] NAME: Jack Jenkins    MR#:  [id number]  DATE OF BIRTH:  Mar 20, 1931  SUBJECTIVE:   Patient started on Precedex due to severe agitation and confusion. Blood pressure was low and therefore Precedex has been discontinued. He is not tolerating BiPAP.   REVIEW OF SYSTEMS:    Review of Systems  Constitutional: Negative for fever, chills weight loss HENT: Negative for ear pain, nosebleeds, congestion, facial swelling, rhinorrhea, neck pain, neck stiffness and ear discharge.   Respiratory: Positive for cough and shortness of breath Cardiovascular: Negative for chest pain, palpitations and leg swelling.  Gastrointestinal: Negative for heartburn, abdominal pain, vomiting, diarrhea or consitpation Genitourinary: Negative for dysuria, urgency, frequency, hematuria Musculoskeletal: Negative for back pain or joint pain Neurological: Negative for dizziness, seizures, syncope, focal weakness,  numbness and headaches.  Hematological: Does not bruise/bleed easily.  Psychiatric/Behavioral: He has had some hallucinations this am and confusion  Tolerating Diet: yes      DRUG ALLERGIES:  No Known Allergies  VITALS:  Blood pressure (!) 78/43, pulse 89, temperature 97.8 F (36.6 C), temperature source Axillary, resp. rate 17, height _0  (1.626 m), weight 103.4 kg (227 lb 15.3 oz), SpO2 95 %.  PHYSICAL EXAMINATION:  Constitutional: Appears well-developed and well-nourished. No distress. HENT: Normocephalic. Marland Kitchen Oropharynx is clear and moist.  Eyes: Conjunctivae and EOM are normal. PERRLA, no scleral icterus.  Neck: Normal ROM. Neck supple. No JVD. No tracheal deviation. CVS: RRR, S1/S2 +, no murmurs, no gallops, no carotid bruit.  Pulmonary: Bilateral respiratory crackles without wheezing or rhonchi   Abdominal: Soft. BS +,  no distension, tenderness, rebound or guarding.  Musculoskeletal: Normal range of motion. No edema and no  tenderness.  Neuro: Alert. CN 2-12 grossly intact. No focal deficits. Skin: Skin is warm and dry. No rash noted. Psychiatric: Patient appears confused this morning     LABORATORY PANEL:   CBC Recent Labs  Lab 04/05/17 0504  WBC 2.9*  HGB 9.9*  HCT 30.6*  PLT 89*   ------------------------------------------------------------------------------------------------------------------  Chemistries  Recent Labs  Lab 04/05/17 0504  NA 143  K 4.2  CL 105  CO2 35*  GLUCOSE 93  BUN 24*  CREATININE 1.31*  CALCIUM 8.2*  MG 2.5*  AST 36  ALT 21  ALKPHOS 55  BILITOT 0.5   ------------------------------------------------------------------------------------------------------------------  Cardiac Enzymes Recent Labs  Lab 04/01/17 0749  TROPONINI <0.03   ------------------------------------------------------------------------------------------------------------------  RADIOLOGY:  No results found.   ASSESSMENT AND PLAN:   83 year old male with history of COPD and MAC on oral treatment who presents with shortness of breath.  1. Acute on chronic hypoxic respiratory failure due to underlying lung disease and an before meals Patient will need at least 3-4 L of oxygen upon discharge from his baseline 2-3 L. Patient was ruled out for pulmonary emboli Patient has chronically elevated left diaphragm Family has requested patient be evaluated by his pulmonologist.  2. Acute COPD exacerbation with CO2 retention Patient's CO2 has improved but still elevated however he is not tolerating BiPAP Continue nebs   3. Recent diagnosis of MAC: Continue rifampin, azithromycin, ethambutol ID consultation requested for Monday.  4. Chronic systolic heart failure ejection fraction 40-50%: Patient does not have signs of exacerbation at this time.  5. Chronic kidney disease stage III: Creatinine is at baseline  6. Lactic acidosis: This has resolved. 7. Hyperkalemia: This has  resolved.  8. Acute metabolic encephalopathy in the setting of CO2 retention  with PCO2 of 83 Patient was transferred to stepdown unit due to encephalopathy from CO2 retention. He remains confused and agitated at times. Family reports no underlying dementia. Would advise to check UA and repeat chest x-ray NH3 and TSH level were within normal limits.  9. Thrombocytopenia: Continue to monitor platelet count and may need to discontinue heparin subcutaneous for DVT prophylaxis   Management plans discussed with nurisng  CODE STATUS: full  TOTAL TIME TAKING CARE OF THIS PATIENT: 24 minutes.     POSSIBLE D/C 3- 4 days, DEPENDING ON CLINICAL CONDITION.   Jack Jenkins M.D on 04/05/2017 at 7:38 AM  Between 7am to 6pm - Pager - [phone] After 6pm go to [email or web address] - password [email or web address] [facility]  Office  [phone]  CC: Primary care physician; Jack Jenkins., MD  Note: This dictation was prepared with Dragon dictation along with smaller phrase technology. Any transcriptional errors that result from this process are unintentional.

## 2017-04-05 NOTE — Consult Note (Signed)
[facility] Infectious Disease     Reason for Consult:MAI    Referring Physician: Simonds, D Date of Admission:  04/01/2017   Active Problems:   COPD (chronic obstructive pulmonary disease) (HCC)   HPI: Jack Jenkins is a 83 y.o. male admitted with SOB and hypoxia and AMS.  He has COPD and is on Home O2. He has been following with pulmonary and had bronch done with cx growing MAI and starte around Oct 8 on triple therapy for MAI with Dr Khan. He was initially on daily dosing but due to GI issues changed to TIW dosing which he is tolerating better. He had noted some improvement in his amount of sputum but otherwise had no change in his breathing with this treatment.   Past Medical History:  Diagnosis Date  . CHF (congestive heart failure) (HCC)   . COPD (chronic obstructive pulmonary disease) (HCC)   . Gout   . Renal disorder    Past Surgical History:  Procedure Laterality Date  . BACK SURGERY    . CATARACT EXTRACTION Bilateral   . CHOLECYSTECTOMY    . FLEXIBLE BRONCHOSCOPY N/A 02/09/2017   Procedure: FLEXIBLE BRONCHOSCOPY;  Surgeon: Khan, Saadat A, MD;  Location: [facility] ORS;  Service: Pulmonary;  Laterality: N/A;  . HERNIA REPAIR    . LAMINECTOMY    . POLYPECTOMY     colon  . UPPER GI ENDOSCOPY  01/15/04   Social History   Tobacco Use  . Smoking status: Never Smoker  . Smokeless tobacco: Never Used  Substance Use Topics  . Alcohol use: No    Alcohol/week: 0.0 oz  . Drug use: No   Family History  Problem Relation Age of Onset  . Heart failure Mother   . Heart attack Father   . CAD Sister   . Lung cancer Brother   . CAD Sister     Allergies: No Known Allergies  Current antibiotics: Antibiotics Given (last 72 hours)    Date/Time Action Medication Dose   04/02/17 1758 Given   rifampin (RIFADIN) capsule 600 mg 600 mg   04/03/17 0838 Given   azithromycin (ZITHROMAX) tablet 250 mg 250 mg   04/04/17 1000 Given   azithromycin (ZITHROMAX) tablet 250 mg 250 mg    04/05/17 1028 Given   ethambutol (MYAMBUTOL) tablet 1,200 mg 1,200 mg   04/05/17 1031 Given   rifampin (RIFADIN) capsule 600 mg 600 mg      MEDICATIONS: . albuterol  2.5 mg Nebulization Once  . aspirin EC  81 mg Oral Daily  . [START ON 04/07/2017] azithromycin  250 mg Oral Once per day on Mon Wed Fri  . clopidogrel  75 mg Oral Daily  . docusate sodium  100 mg Oral BID  . ethambutol  1,200 mg Oral Once per day on Mon Wed Fri  . feeding supplement (NEPRO CARB STEADY)  237 mL Oral BID BM  . mouth rinse  15 mL Mouth Rinse BID  . metoprolol succinate  25 mg Oral Daily  . mometasone-formoterol  2 puff Inhalation BID  . multivitamin with minerals  1 tablet Oral Daily  . rifampin  600 mg Oral Once per day on Mon Wed Fri  . tiotropium  18 mcg Inhalation Daily    Review of Systems - 11 systems reviewed and negative per HPI   OBJECTIVE: Temp:  [97.7 F (36.5 C)-99 F (37.2 C)] 99 F (37.2 C) (11/26 1200) Pulse Rate:  [63-116] 110 (11/26 1400) Resp:  [14-30] 28 (11/26   1400) BP: (64-136)/(31-81) 127/70 (11/26 1400) SpO2:  [93 %-100 %] 95 % (11/26 1400) FiO2 (%):  [40 %] 40 % (11/25 1956) Weight:  [103.4 kg (227 lb 15.3 oz)] 103.4 kg (227 lb 15.3 oz) (11/26 0317) Physical Exam  Constitutional: He is frail but oriented to person, place, and time. He appears well-developed and well-nourished. No distress.  HENT: anicteric Mouth/Throat: Oropharynx is clear and moist. No oropharyngeal exudate.  Cardiovascular: Normal rate, regular rhythm and normal heart sounds. Pulmonary/Chest:poor air movementt, rhonchi  Abdominal: Soft. Bowel sounds are normal. He exhibits no distension. There is no tenderness.  Lymphadenopathy:  He has no cervical adenopathy.  Neurological: He is alert and oriented to person, place, and time.  Skin: Skin is warm and dry. No rash noted. No erythema.  Psychiatric: He has a normal mood and affect. His behavior is normal.    LABS: Results for orders placed or  performed during the hospital encounter of 04/01/17 (from the past 48 hour(s))  Glucose, capillary     Status: None   Collection Time: 04/04/17  7:13 AM  Result Value Ref Range   Glucose-Capillary 88 65 - 99 mg/dL  Blood gas, arterial     Status: Abnormal   Collection Time: 04/04/17  9:54 AM  Result Value Ref Range   FIO2 0.32    pH, Arterial 7.44 7.350 - 7.450   pCO2 arterial 61 (H) 32.0 - 48.0 mmHg   pO2, Arterial 56 (L) 83.0 - 108.0 mmHg   Bicarbonate 41.4 (H) 20.0 - 28.0 mmol/L   Acid-Base Excess 14.8 (H) 0.0 - 2.0 mmol/L   O2 Saturation 89.8 %   Patient temperature 37.0    Collection site RIGHT RADIAL    Sample type ARTERIAL DRAW    Allens test (pass/fail) PASS PASS  Basic metabolic panel     Status: Abnormal   Collection Time: 04/05/17  4:17 AM  Result Value Ref Range   Sodium 143 135 - 145 mmol/L   Potassium 4.1 3.5 - 5.1 mmol/L   Chloride 105 101 - 111 mmol/L   CO2 35 (H) 22 - 32 mmol/L   Glucose, Bld 99 65 - 99 mg/dL   BUN 22 (H) 6 - 20 mg/dL   Creatinine, Ser 1.24 0.61 - 1.24 mg/dL   Calcium 8.3 (L) 8.9 - 10.3 mg/dL   GFR calc non Af Amer 52 (L) >60 mL/min   GFR calc Af Amer 60 (L) >60 mL/min    Comment: (NOTE) The eGFR has been calculated using the CKD EPI equation. This calculation has not been validated in all clinical situations. eGFR's persistently <60 mL/min signify possible Chronic Kidney Disease.    Anion gap 3 (L) 5 - 15  Lactic acid, plasma     Status: None   Collection Time: 04/05/17  4:17 AM  Result Value Ref Range   Lactic Acid, Venous 0.6 0.5 - 1.9 mmol/L  Comprehensive metabolic panel     Status: Abnormal   Collection Time: 04/05/17  5:04 AM  Result Value Ref Range   Sodium 143 135 - 145 mmol/L   Potassium 4.2 3.5 - 5.1 mmol/L   Chloride 105 101 - 111 mmol/L   CO2 35 (H) 22 - 32 mmol/L   Glucose, Bld 93 65 - 99 mg/dL   BUN 24 (H) 6 - 20 mg/dL   Creatinine, Ser 1.31 (H) 0.61 - 1.24 mg/dL   Calcium 8.2 (L) 8.9 - 10.3 mg/dL   Total Protein  4.9 (L) 6.5 - 8.1   g/dL   Albumin 2.6 (L) 3.5 - 5.0 g/dL   AST 36 15 - 41 U/L   ALT 21 17 - 63 U/L   Alkaline Phosphatase 55 38 - 126 U/L   Total Bilirubin 0.5 0.3 - 1.2 mg/dL   GFR calc non Af Amer 48 (L) >60 mL/min   GFR calc Af Amer 56 (L) >60 mL/min    Comment: (NOTE) The eGFR has been calculated using the CKD EPI equation. This calculation has not been validated in all clinical situations. eGFR's persistently <60 mL/min signify possible Chronic Kidney Disease.    Anion gap 3 (L) 5 - 15  Magnesium     Status: Abnormal   Collection Time: 04/05/17  5:04 AM  Result Value Ref Range   Magnesium 2.5 (H) 1.7 - 2.4 mg/dL  Phosphorus     Status: None   Collection Time: 04/05/17  5:04 AM  Result Value Ref Range   Phosphorus 4.5 2.5 - 4.6 mg/dL  CBC with Differential/Platelet     Status: Abnormal   Collection Time: 04/05/17  5:04 AM  Result Value Ref Range   WBC 2.9 (L) 3.8 - 10.6 K/uL   RBC 3.14 (L) 4.40 - 5.90 MIL/uL   Hemoglobin 9.9 (L) 13.0 - 18.0 g/dL   HCT 30.6 (L) 40.0 - 52.0 %   MCV 97.5 80.0 - 100.0 fL   MCH 31.4 26.0 - 34.0 pg   MCHC 32.2 32.0 - 36.0 g/dL   RDW 15.9 (H) 11.5 - 14.5 %   Platelets 89 (L) 150 - 440 K/uL   Neutrophils Relative % 63 %   Neutro Abs 1.8 1.4 - 6.5 K/uL   Lymphocytes Relative 17 %   Lymphs Abs 0.5 (L) 1.0 - 3.6 K/uL   Monocytes Relative 16 %   Monocytes Absolute 0.5 0.2 - 1.0 K/uL   Eosinophils Relative 4 %   Eosinophils Absolute 0.1 0 - 0.7 K/uL   Basophils Relative 0 %   Basophils Absolute 0.0 0 - 0.1 K/uL  Ammonia     Status: Abnormal   Collection Time: 04/05/17  5:04 AM  Result Value Ref Range   Ammonia 38 (H) 9 - 35 umol/L  Glucose, capillary     Status: None   Collection Time: 04/05/17  7:21 AM  Result Value Ref Range   Glucose-Capillary 72 65 - 99 mg/dL   No components found for: ESR, C REACTIVE PROTEIN MICRO: Recent Results (from the past 720 hour(s))  MRSA PCR Screening     Status: None   Collection Time: 04/03/17 10:23 AM   Result Value Ref Range Status   MRSA by PCR NEGATIVE NEGATIVE Final    Comment:        The GeneXpert MRSA Assay (FDA approved for NASAL specimens only), is one component of a comprehensive MRSA colonization surveillance program. It is not intended to diagnose MRSA infection nor to guide or monitor treatment for MRSA infections.     IMAGING: Dg Chest 1 View  Result Date: 04/02/2017 CLINICAL DATA:  Shortness of breath since yesterday. EXAM: CHEST 1 VIEW COMPARISON:  04/01/2017 and CT 04/01/2017 FINDINGS: Lungs are hypoinflated with linear atelectasis/scarring over the left base. The small left effusion with basilar atelectasis seen on the recent CT is not well visualized on today's radiograph mild stable cardiomegaly. Remainder the exam is unchanged. IMPRESSION: Hyperinflation without acute cardiopulmonary disease. Small left effusions/atelectasis seen on CT yesterday is not well visualized. Mild stable cardiomegaly. Electronically Signed   By: Jack Quince  Jenkins M.D.   On: 04/02/2017 09:28   Ct Head Wo Contrast  Result Date: 04/01/2017 CLINICAL DATA:  Mental status changes. EXAM: CT HEAD WITHOUT CONTRAST TECHNIQUE: Contiguous axial images were obtained from the base of the skull through the vertex without intravenous contrast. COMPARISON:  None. FINDINGS: Brain: The ventricles are normal in size and configuration. No extra-axial fluid collections are identified. The gray-white differentiation is normal. No CT findings for acute intracranial process such as hemorrhage or infarction. 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 No mass lesions. The brainstem and cerebellum are grossly normal. Vascular: Vascular calcifications but no definite aneurysm or hyperdense vessels. Skull: No skull fracture or bone lesion. Sinuses/Orbits: The paranasal sinuses and mastoid air cells are clear. The globes are intact. Other: No scalp lesions or hematoma. IMPRESSION: No  acute intracranial findings or mass lesion. Electronically Signed   By: P.  Gallerani M.D.   On: 04/01/2017 10:57   Ct Angio Chest Pe W Or Wo Contrast  Result Date: 04/01/2017 CLINICAL DATA:  Shortness of breath.  Leg swelling. EXAM: CT ANGIOGRAPHY CHEST WITH CONTRAST TECHNIQUE: Multidetector CT imaging of the chest was performed using the standard protocol during bolus administration of intravenous contrast. Multiplanar CT image reconstructions and MIPs were obtained to evaluate the vascular anatomy. CONTRAST:  75mL ISOVUE-370 IOPAMIDOL (ISOVUE-370) INJECTION 76% COMPARISON:  Plain films of earlier today.  CT of 01/25/2017. FINDINGS: Cardiovascular: The quality of this exam for evaluation of pulmonary embolism is moderate. The bolus is centered in the SVC. No pulmonary embolism to the large segmental level Aortic atherosclerosis. Tortuous thoracic aorta. Mild cardiomegaly with lipomatous hypertrophy of the interatrial septum. No pericardial effusion. Mediastinum/Nodes: No mediastinal or hilar adenopathy. Lungs/Pleura: New small left pleural effusion. Left hemidiaphragm elevation with adjacent volume loss and passive atelectasis. Calcified granulomas at the right lung base. Mucoid impaction within right lower lobe subsegmental bronchi, including on image 66/ series 6. Similar. Re- demonstration of volume loss in the right middle lobe. Minimal left upper lobe clustered "Tree-in-bud" nodularity, including on image 26/ series 6, similar. Similar subtle findings identified in the right upper and right lower lobes. Upper Abdomen: Normal imaged portions of the liver, spleen, stomach, pancreas. Cholecystectomy. Normal imaged adrenal glands and right kidney. Posterior upper pole left renal cyst. An anterolateral left upper pole 2.9 cm lesion measures greater than fluid density, including on image 93/series 4. Is similar in size back on 05/15/2013 Musculoskeletal: Moderate to marked bilateral gynecomastia. Probable  sebaceous cyst about the anterior right chest wall at 10 mm. Osteopenia. Review of the MIP images confirms the above findings. IMPRESSION: 1. No evidence of pulmonary embolism. Mild limitations as detailed above. 2. New small left pleural effusion. Given the clinical history, this could represent fluid overload. 3. Similar appearance of the lungs, with left hemidiaphragm elevation and adjacent passive atelectasis. Subtle "Tree-in-bud" nodularity again identified, suggestive of infectious bronchiolitis. 4.  Aortic Atherosclerosis (ICD10-I70.0). 5. Left renal lesion which is technically indeterminate but favored to represent a minimally complex cyst, given size stability relative to 05/16/2003. Electronically Signed   By: Jack Jenkins M.D.   On: 04/01/2017 14:41   Us Venous Img Lower Unilateral Right  Result Date: 04/01/2017 CLINICAL DATA:  Right leg swelling for 2 years. Short of breath. Worsening over the last 3 weeks. EXAM: RIGHT LOWER EXTREMITY VENOUS DUPLEX ULTRASOUND TECHNIQUE: Doppler venous assessment of the right lower extremity deep venous system was performed, including characterization of spectral flow, compressibility, and phasicity.   COMPARISON:  None. FINDINGS: There is complete compressibility of the right common femoral, femoral, and popliteal veins. Doppler analysis demonstrates respiratory phasicity and augmentation of flow with calf compression. No obvious superficial vein or calf vein thrombosis. IMPRESSION: No evidence of right lower extremity DVT. Electronically Signed   By: Jack Jenkins M.D.   On: 04/01/2017 08:39   Dg Chest Portable 1 View  Result Date: 04/01/2017 CLINICAL DATA:  Short of breath EXAM: PORTABLE CHEST 1 VIEW COMPARISON:  02/09/2017 FINDINGS: Bibasilar airspace disease left greater than right unchanged. Negative for heart failure or effusion. 1 cm nodular density overlying the left fourth rib posteriorly compatible with a bone island in the rib. IMPRESSION: Bibasilar  atelectasis unchanged. Electronically Signed   By: Franchot Gallo M.D.   On: 04/01/2017 08:12    Assessment:   LOPAKA KARGE is a 83 y.o. male with COPD, recently dxed MAI who started treatment 10/8 now with worsening hypoxia and AMS.  He has no fevers and wbc nml.  Clinically improving with treatment of COPD. His CT scan does not show extensive disease .  Recommendations Continue triple therapy for MAI. However MAI is unlikely the cause of his current exacerbation as it is usually a chronic and slowly progressive process.  Thank you very much for allowing me to participate in the care of this patient. Please call with questions.   Cheral Marker. Ola Spurr, MD

## 2017-04-05 NOTE — Progress Notes (Signed)
Per Dr Renae GlossWieting telemetry order not needed on patient

## 2017-04-05 NOTE — Progress Notes (Signed)
1740 Transferred to 102.

## 2017-04-06 ENCOUNTER — Inpatient Hospital Stay: Payer: Medicare Other

## 2017-04-06 LAB — CBC WITH DIFFERENTIAL/PLATELET
Basophils Absolute: 0 10*3/uL (ref 0–0.1)
Basophils Relative: 0 %
EOS ABS: 0.1 10*3/uL (ref 0–0.7)
EOS PCT: 2 %
HCT: 35.1 % — ABNORMAL LOW (ref 40.0–52.0)
HEMOGLOBIN: 11.4 g/dL — AB (ref 13.0–18.0)
LYMPHS ABS: 0.6 10*3/uL — AB (ref 1.0–3.6)
LYMPHS PCT: 10 %
MCH: 31.3 pg (ref 26.0–34.0)
MCHC: 32.3 g/dL (ref 32.0–36.0)
MCV: 96.8 fL (ref 80.0–100.0)
Monocytes Absolute: 1 10*3/uL (ref 0.2–1.0)
Monocytes Relative: 18 %
Neutro Abs: 4.1 10*3/uL (ref 1.4–6.5)
Neutrophils Relative %: 70 %
PLATELETS: 109 10*3/uL — AB (ref 150–440)
RBC: 3.63 MIL/uL — ABNORMAL LOW (ref 4.40–5.90)
RDW: 15.5 % — ABNORMAL HIGH (ref 11.5–14.5)
WBC: 5.8 10*3/uL (ref 3.8–10.6)

## 2017-04-06 LAB — COMPREHENSIVE METABOLIC PANEL
ALBUMIN: 2.9 g/dL — AB (ref 3.5–5.0)
ALT: 37 U/L (ref 17–63)
AST: 54 U/L — AB (ref 15–41)
Alkaline Phosphatase: 68 U/L (ref 38–126)
Anion gap: 8 (ref 5–15)
BUN: 22 mg/dL — AB (ref 6–20)
CHLORIDE: 102 mmol/L (ref 101–111)
CO2: 33 mmol/L — AB (ref 22–32)
CREATININE: 1.32 mg/dL — AB (ref 0.61–1.24)
Calcium: 8.7 mg/dL — ABNORMAL LOW (ref 8.9–10.3)
GFR calc Af Amer: 55 mL/min — ABNORMAL LOW (ref >60)
GFR calc non Af Amer: 48 mL/min — ABNORMAL LOW (ref >60)
Glucose, Bld: 96 mg/dL (ref 65–99)
POTASSIUM: 4.8 mmol/L (ref 3.5–5.1)
SODIUM: 143 mmol/L (ref 135–145)
Total Bilirubin: 1.1 mg/dL (ref 0.3–1.2)
Total Protein: 5.8 g/dL — ABNORMAL LOW (ref 6.5–8.1)

## 2017-04-06 LAB — SEDIMENTATION RATE: Sed Rate: 45 mm/hr — ABNORMAL HIGH (ref 0–20)

## 2017-04-06 LAB — GLUCOSE, CAPILLARY: GLUCOSE-CAPILLARY: 94 mg/dL (ref 65–99)

## 2017-04-06 LAB — SYNOVIAL CELL COUNT + DIFF, W/ CRYSTALS
EOSINOPHILS-SYNOVIAL: 0 %
Lymphocytes-Synovial Fld: 0 %
Monocyte-Macrophage-Synovial Fluid: 12 %
NEUTROPHIL, SYNOVIAL: 88 %
WBC, SYNOVIAL: 22081 /mm3 — AB (ref 0–200)

## 2017-04-06 LAB — C-REACTIVE PROTEIN: CRP: 6.3 mg/dL — AB (ref <1.0)

## 2017-04-06 LAB — URIC ACID: Uric Acid, Serum: 6.5 mg/dL (ref 4.4–7.6)

## 2017-04-06 MED ORDER — COLCHICINE 0.6 MG PO TABS
0.6000 mg | ORAL_TABLET | Freq: Every day | ORAL | Status: DC
  Administered 2017-04-06 – 2017-04-07 (×2): 0.6 mg via ORAL
  Filled 2017-04-06 (×2): qty 1

## 2017-04-06 MED ORDER — GUAIFENESIN 100 MG/5ML PO SOLN
5.0000 mL | ORAL | Status: DC | PRN
  Administered 2017-04-06 – 2017-04-07 (×2): 100 mg via ORAL
  Filled 2017-04-06 (×3): qty 5

## 2017-04-06 MED ORDER — TRIAMCINOLONE ACETONIDE 40 MG/ML IJ SUSP (RADIOLOGY)
80.0000 mg | Freq: Once | INTRAMUSCULAR | Status: AC
  Administered 2017-04-06: 11:00:00 80 mg via INTRA_ARTICULAR
  Filled 2017-04-06: qty 2

## 2017-04-06 MED ORDER — BUPIVACAINE HCL (PF) 0.5 % IJ SOLN
10.0000 mL | Freq: Once | INTRAMUSCULAR | Status: AC
  Administered 2017-04-06: 11:00:00 10 mL
  Filled 2017-04-06: qty 10

## 2017-04-06 NOTE — Evaluation (Addendum)
Physical Therapy Evaluation Patient Details Name: Jack Jenkins MRN: [id number] DOB: [birth year] Today's Date: 04/06/2017   History of Present Illness  Pt admitted for COPD. Pt with complaints of SOB upon arrival. History includes CHF, COPD, cyocobacterium avium infection, gout, and CKD. Pt with recent bronchoscopy on 10/2.  Pt with stay complicated admission to CCU secondary to increased confusion and SOB symptoms. Re-evaluation done this date.   Clinical Impression  Pt is a pleasant 83 year old male who was admitted for COPD exacerbation. Pt performs bed mobility with min assist, transfers with min assist, and ambulation with cga and RW. Pt performed all mobility on 3L of O2 with O2 sats decreasing to 87% with exertion and HR increasing to 122. Cues for pursed lip breathing with sats improving to 90%. Pt with CCU stay secondary to confusion, now back at baseline mental status. Appears to be slightly weaker than previous evaluation, will need to monitor trajectory for home discharge. Pt demonstrates deficits with strength/balance/endurance/pain. Complains of increased pain in L knee this date, severe 10/10 pain. RN notified, as pain limited mobility. Would benefit from skilled PT to address above deficits and promote optimal return to PLOF. Recommend transition to HHPT upon discharge from acute hospitalization.       Follow Up Recommendations Home health PT;Supervision for mobility/OOB    Equipment Recommendations  None recommended by PT    Recommendations for Other Services       Precautions / Restrictions Precautions Precautions: Fall Restrictions Weight Bearing Restrictions: No      Mobility  Bed Mobility Overal bed mobility: Needs Assistance Bed Mobility: Supine to Sit     Supine to sit: Min assist     General bed mobility comments: needs assist for sliding L LE out towards EOB. Once seated at EOB, able to sit with supervision  Transfers Overall transfer level: Needs  assistance Equipment used: Rolling walker (2 wheeled) Transfers: Sit to/from Stand Sit to Stand: Min assist         General transfer comment: assist needed along with bed elevated. Increased pain with WBing on L LE. RW used for safety.   Ambulation/Gait Ambulation/Gait assistance: Min guard Ambulation Distance (Feet): 3 Feet Assistive device: Rolling walker (2 wheeled) Gait Pattern/deviations: Step-to pattern;Antalgic     General Gait Details: ambulated using RW to recliner with antalgic gait secondary to pain. Safe technique noted. Unable to further ambulate secondary to pain  Stairs            Wheelchair Mobility    Modified Rankin (Stroke Patients Only)       Balance Overall balance assessment: Needs assistance Sitting-balance support: Feet supported Sitting balance-Leahy Scale: Good     Standing balance support: Bilateral upper extremity supported Standing balance-Leahy Scale: Fair                               Pertinent Vitals/Pain Pain Assessment: 0-10 Pain Score: 10-Worst pain ever Pain Location: L knee secondary to gout Pain Descriptors / Indicators: Aching Pain Intervention(s): Limited activity within patient's tolerance;Repositioned    Home Living Family/patient expects to be discharged to:: Private residence Living Arrangements: Spouse/significant other Available Help at Discharge: Family Type of Home: House Home Access: Level entry     Home Layout: One level Home Equipment: Environmental consultantWalker - 2 wheels;Walker - standard;Wheelchair - manual Additional Comments: pt and spouse both report home is handicap accessible    Prior Function Level of Independence: Independent  with assistive device(s)         Comments: uses SPC for all mobility, occasionally uses WC for long distances     Hand Dominance        Extremity/Trunk Assessment   Upper Extremity Assessment Upper Extremity Assessment: Overall WFL for tasks assessed    Lower  Extremity Assessment Lower Extremity Assessment: Generalized weakness(R LE grossly 4/5; L LE grossly 3+/5 (pain limited))       Communication   Communication: No difficulties  Cognition Arousal/Alertness: Awake/alert Behavior During Therapy: WFL for tasks assessed/performed Overall Cognitive Status: Within Functional Limits for tasks assessed                                        General Comments      Exercises Other Exercises Other Exercises: Pt agreeable to limited ther-ex as breakfast tray just arrived. Seated ther-ex performed on R LE including ankle pumps, LAQ, and hip abd/add. B UE ther-ex performed including shoulder flexion. All ther-ex x 10 reps with cues for technique. Educated on continued therex during hospital stay Other Exercises: Stood at bedside and used urinal with cga. Safe technique   Assessment/Plan    PT Assessment Patient needs continued PT services  PT Problem List Decreased strength;Decreased balance;Decreased mobility       PT Treatment Interventions Gait training;Therapeutic exercise;Balance training    PT Goals (Current goals can be found in the Care Plan section)  Acute Rehab PT Goals Patient Stated Goal: to go home PT Goal Formulation: With patient Time For Goal Achievement: 04/20/17 Potential to Achieve Goals: Good    Frequency Min 2X/week   Barriers to discharge        Co-evaluation               AM-PAC PT "6 Clicks" Daily Activity  Outcome Measure Difficulty turning over in bed (including adjusting bedclothes, sheets and blankets)?: Unable Difficulty moving from lying on back to sitting on the side of the bed? : Unable Difficulty sitting down on and standing up from a chair with arms (e.g., wheelchair, bedside commode, etc,.)?: Unable Help needed moving to and from a bed to chair (including a wheelchair)?: A Little Help needed walking in hospital room?: A Lot Help needed climbing 3-5 steps with a railing? : A  Lot 6 Click Score: 10    End of Session Equipment Utilized During Treatment: Gait belt;Oxygen Activity Tolerance: Patient limited by pain Patient left: in chair;with chair alarm set Nurse Communication: Mobility status PT Visit Diagnosis: Unsteadiness on feet (R26.81);Muscle weakness (generalized) (M62.81);Difficulty in walking, not elsewhere classified (R26.2)    Time: [id number] PT Time Calculation (min) (ACUTE ONLY): 22 min   Charges:   PT Evaluation $PT Re-evaluation: 1 Re-eval PT Treatments $Therapeutic Exercise: 8-22 mins   PT G Codes:   PT G-Codes **NOT FOR INPATIENT CLASS** Functional Assessment Tool Used: AM-PAC 6 Clicks Basic Mobility Functional Limitation: Mobility: Walking and moving around Mobility: Walking and Moving Around Current Status ([id number]([id number]): At least 60 percent but less than 80 percent impaired, limited or restricted Mobility: Walking and Moving Around Goal Status [phone](G8979): At least 40 percent but less than 60 percent impaired, limited or restricted    Elizabeth PalauStephanie Reagen Goates, PT, DPT [phone]   Cory Kitt 04/06/2017, 10:23 AM

## 2017-04-06 NOTE — Progress Notes (Signed)
[place] [facility] [place] NAME: Jack Jenkins    MR#:  [id number]  DATE OF BIRTH:  10-26-1930  SUBJECTIVE:   Patient complaining of left knee pain this morning. Mental status is back to baseline. Family is at bedside.  REVIEW OF SYSTEMS:    Review of Systems  Constitutional: Negative for fever, chills weight loss HENT: Negative for ear pain, nosebleeds, congestion, facial swelling, rhinorrhea, neck pain, neck stiffness and ear discharge.   Respiratory: Improved cough and shortness of breath Cardiovascular: Negative for chest pain, palpitations and leg swelling.  Gastrointestinal: Negative for heartburn, abdominal pain, vomiting, diarrhea or consitpation Genitourinary: Negative for dysuria, urgency, frequency, hematuria Musculoskeletal: Left knee pain and swelling with history of gout  Neurological: Negative for dizziness, seizures, syncope, focal weakness,  numbness and headaches.  Hematological: Does not bruise/bleed easily.  Psychiatric/Behavioral: Improved confusion and hallucinations   Tolerating Diet: yes      DRUG ALLERGIES:  No Known Allergies  VITALS:  Blood pressure (!) 141/62, pulse (!) 113, temperature 98.8 F (37.1 C), temperature source Oral, resp. rate 17, height 5' 4"  (1.626 m), weight 103.3 kg (227 lb 12.8 oz), SpO2 98 %.  PHYSICAL EXAMINATION:  Constitutional: Appears well-developed and well-nourished. No distress. HENT: Normocephalic. Marland Kitchen Oropharynx is clear and moist.  Eyes: Conjunctivae and EOM are normal. PERRLA, no scleral icterus.  Neck: Normal ROM. Neck supple. No JVD. No tracheal deviation. CVS: RRR, S1/S2 +, no murmurs, no gallops, no carotid bruit.  Pulmonary: Bilateral respiratory crackles without wheezing or rhonchi   Abdominal: Soft. BS +,  no distension, tenderness, rebound or guarding.  Musculoskeletal: Left knee with significant tenderness at joint line and significant swelling  Neuro: Alert. CN 2-12  grossly intact. No focal deficits. Skin: Skin is warm and dry. No rash noted. Psychiatric: Normal affect and mood    LABORATORY PANEL:   CBC Recent Labs  Lab 04/06/17 0550  WBC 5.8  HGB 11.4*  HCT 35.1*  PLT 109*   ------------------------------------------------------------------------------------------------------------------  Chemistries  Recent Labs  Lab 04/05/17 0504  NA 143  K 4.2  CL 105  CO2 35*  GLUCOSE 93  BUN 24*  CREATININE 1.31*  CALCIUM 8.2*  MG 2.5*  AST 36  ALT 21  ALKPHOS 55  BILITOT 0.5   ------------------------------------------------------------------------------------------------------------------  Cardiac Enzymes Recent Labs  Lab 04/01/17 0749  TROPONINI <0.03   ------------------------------------------------------------------------------------------------------------------  RADIOLOGY:  No results found.   ASSESSMENT AND PLAN:   83 year old male with history of COPD and MAC on oral treatment who presents with shortness of breath.  1. Acute on chronic hypoxic respiratory failure due to underlying lung disease and an before meals Patient will need at least 3-4 L of oxygen upon discharge from his baseline 2-3 L. Patient was ruled out for pulmonary emboli Patient has chronically elevated left diaphragm Pulmonary to see patient today.   2. Acute COPD exacerbation with CO2 retention: Patient CO2 has improved. He does not require steroids at this time. Continue inhalers.  3. Recent diagnosis of MAC: ID consult appreciated.  Recommendations are to continue rifampin, azithromycin, ethambutol   4. Chronic systolic heart failure ejection fraction 40-50%: Patient does not have signs of exacerbation at this time.  5. Chronic kidney disease stage III: Creatinine is at baseline  6. Lactic acidosis: This has resolved. 7. Hyperkalemia: This has resolved.  8. Acute metabolic encephalopathy in the setting of CO2 retention with PCO2 of  83 Patient was transferred to stepdown unit due  to encephalopathy from CO2 retention.  He is now back at baseline.  9. Thrombocytopenia: Platelets are steadily improving   10. Left knee pain with likely diagnosis of gout Check ESR, CRP and uric acid Start Colchine, not taking Allopurinol at home  XRAY left knee [facility] consult requested for possible aspiration   Management plans discussed with nurisng  CODE STATUS: full  TOTAL TIME TAKING CARE OF THIS PATIENT: 28 minutes.     POSSIBLE D/C 3- 4 days, DEPENDING ON CLINICAL CONDITION.   Jaelen Gellerman M.D on 04/06/2017 at 8:55 AM  Between 7am to 6pm - Pager - [phone] After 6pm go to [email or web address] - password [email or web address] [facility]  Office  [phone]  CC: Primary care physician; [name]., MD  Note: This dictation was prepared with Dragon dictation along with smaller phrase technology. Any transcriptional errors that result from this process are unintentional.

## 2017-04-06 NOTE — Care Management (Signed)
Physical therapy evaluation completed. Recommending home with home health and physical therapy. Discussed agencies with Mr & ms Clayburn at the bedside. States that they are not sure if they want services in the home. Discussed the possibly of selected an agency while still in the hospital. Can decide if they really want someone when th representative calls the home. Chose [facility]. Will update Barbara CowerJason, [facility] representative. Gwenette GreetBrenda S Cady Hafen RN MSN CCM Care Management [phone]

## 2017-04-06 NOTE — Consult Note (Signed)
ORTHOPAEDIC CONSULTATION  REQUESTING PHYSICIAN: Bettey Costa, MD  Chief Complaint:   Left knee pain and swelling.  History of Present Illness: Jack Jenkins is an 83 y.o. male with a history of congestive heart failure, COPD, chronic renal disease, and gout who has been admitted for the past 5 days for a COPD flareup with an underlying pneumonia.  The patient has responded to this treatment, but was noted this morning to have pain and swelling in his left knee.  Therefore, orthopedics was consulted to assess for gout versus a possible septic knee.  The patient denies any specific injury to the knee, but does note that he was resting the side of his leg against a rail for an extended period of time yesterday which may have aggravated his symptoms.  He denies any numbness or paresthesias down his leg to his foot.  He does have a history of gout, which has occasionally presented quite severely.  At home, he has been taking allopurinol on a daily basis with Colcrys on an as necessary basis when he senses the flareup of any gouty symptoms.  Past Medical History:  Diagnosis Date  . CHF (congestive heart failure) ([place])   . COPD (chronic obstructive pulmonary disease) ([place])   . Gout   . Renal disorder    Past Surgical History:  Procedure Laterality Date  . BACK SURGERY    . CATARACT EXTRACTION Bilateral   . CHOLECYSTECTOMY    . FLEXIBLE BRONCHOSCOPY N/A 02/09/2017   Procedure: FLEXIBLE BRONCHOSCOPY;  Surgeon: Allyne Gee, MD;  Location: [facility] ORS;  Service: Pulmonary;  Laterality: N/A;  . HERNIA REPAIR    . LAMINECTOMY    . POLYPECTOMY     colon  . UPPER GI ENDOSCOPY  01/15/04   Social History   Socioeconomic History  . Marital status: Married    Spouse name: None  . Number of children: None  . Years of education: None  . Highest education level: None  Social Needs  . Financial resource strain: None  . Food insecurity -  worry: None  . Food insecurity - inability: None  . Transportation needs - medical: None  . Transportation needs - non-medical: None  Occupational History  . None  Tobacco Use  . Smoking status: Never Smoker  . Smokeless tobacco: Never Used  Substance and Sexual Activity  . Alcohol use: No    Alcohol/week: 0.0 oz  . Drug use: No  . Sexual activity: No  Other Topics Concern  . None  Social History Narrative  . None   Family History  Problem Relation Age of Onset  . Heart failure Mother   . Heart attack Father   . CAD Sister   . Lung cancer Brother   . CAD Sister    No Known Allergies Prior to Admission medications   Medication Sig Start Date End Date Taking? Authorizing Provider  aspirin EC 81 MG EC tablet Take 1 tablet (81 mg total) by mouth daily. 04/24/15  Yes Fritzi Mandes, MD  azithromycin (ZITHROMAX) 250 MG tablet Take 1 tablet by mouth 3 (three) times a week. 03/24/17  Yes [provider]  ethambutol (MYAMBUTOL) 400 MG tablet Take 1,200 mg by mouth 3 (three) times a week. 03/24/17  Yes [provider]  furosemide (LASIX) 40 MG tablet Take 1 tablet (40 mg total) by mouth daily as needed for fluid. 10/20/16  Yes [name]., MD  metoprolol succinate (TOPROL XL) 25 MG 24 hr tablet Take  1 tablet (25 mg total) by mouth daily. 10/20/16  Yes [name]., MD  rifampin (RIFADIN) 300 MG capsule Take 600 mg by mouth 3 (three) times a week. 02/16/17  Yes [provider]  tiotropium (SPIRIVA) 18 MCG inhalation capsule Place 1 capsule (18 mcg total) into inhaler and inhale daily. 04/24/15  Yes Fritzi Mandes, MD  allopurinol (ZYLOPRIM) 100 MG tablet Take 1 tablet (100 mg total) by mouth daily as needed (for gout). Patient not taking: Reported on 04/05/2017 10/20/16   [name]., MD  clopidogrel (PLAVIX) 75 MG tablet Take 1 tablet (75 mg total) by mouth daily. Patient not taking: Reported on 10/09/2016 10/08/16 10/08/17  Loletha Grayer,  MD  dextromethorphan-guaiFENesin [facility] DM) 30-600 MG 12hr tablet Take 1 tablet by mouth 2 (two) times daily. Patient not taking: Reported on 02/08/2017 11/12/16   Chrismon, Vickki Muff, PA  mometasone-formoterol (DULERA) 100-5 MCG/ACT AERO Inhale 2 puffs into the lungs 2 (two) times daily. Patient not taking: Reported on 11/12/2016 04/24/15   Fritzi Mandes, MD  nitroGLYCERIN (NITROSTAT) 0.4 MG SL tablet Place 1 tablet (0.4 mg total) under the tongue every 5 (five) minutes as needed for chest pain. Patient not taking: Reported on 10/09/2016 10/08/16   Loletha Grayer, MD  ranitidine (ZANTAC) 150 MG tablet Take 1 tablet (150 mg total) by mouth 2 (two) times daily. Patient not taking: Reported on 04/01/2017 12/17/14   Carmon Ginsberg, PA   Dg Knee 1-2 Views Left  Result Date: 04/06/2017 CLINICAL DATA:  Left knee pain and history of gout. EXAM: LEFT KNEE - 1-2 VIEW COMPARISON:  10/15/2011 FINDINGS: No evidence of fracture. There is a small suprapatellar joint effusion. Mild patellofemoral degenerative disease. Chondrocalcinosis involving the medial joint space with mild medial joint space narrowing. Calcification also seen adjacent to the lateral femoral condyle. No evidence of bony lesion or destruction. 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 Electronically Signed   By: Aletta Edouard M.D.   On: 04/06/2017 09:28    Positive ROS: All other systems have been reviewed and were otherwise negative with the exception of those mentioned in the HPI and as above.  Physical Exam: General:  Alert, no acute distress Psychiatric:  Patient is competent for consent with normal mood and affect   Cardiovascular:  No pedal edema Respiratory:  No wheezing, non-labored breathing GI:  Abdomen is soft and non-tender Skin:  No lesions in the area of chief complaint Neurologic:  Sensation intact distally Lymphatic:  No axillary or cervical  lymphadenopathy  Orthopedic Exam:  Orthopedic examination is limited left knee and lower extremity.  The patient's left knee shows no erythema or excessive warmth.  There is moderate swelling with a 2+ effusion.  He has moderate tenderness over the medial aspect of the knee and mild-moderate tenderness to palpation of the lateral aspect of the knee.  He is able to perform active straight leg raise, although with some discomfort.  Knee range of motion is limited due to pain, but he can tolerate range of motion from 10-50 degrees.  The knee is stable to varus and valgus stressing.  His patella appears to track well through the limited range of motion.  He is neurovascularly intact to his left lower extremity and foot.  X-rays:  Recent x-rays of the left knee are available for review.  These films demonstrate mild early degenerative changes with overall satisfactory maintenance of the clear spaces in all compartments, although  the x-rays are nonweightbearing.  No lytic lesions or fractures are identified.  There is evidence of a reasonably large effusion as evidenced by fluid in the suprapatellar pouch.  Assessment: Painful effusion left knee, probably secondary to gout.  Plan: The treatment options have been discussed with the patient and his wife.  The patient would like to proceed with a left knee aspiration with steroid injection.  After obtaining verbal consent, the left knee was aspirated sterilely of 60 cc of turbid yellowish fluid, consistent with gout.  Upon completion of the aspiration, the left knee was injected with a solution of 2 cc of Kenalog 40 (80 mg) and 8 cc of 0.5% plain Sensorcaine.  The patient tolerated the procedure well.  The patient may be mobilized as symptoms permit.  Thank you for asking me to participate in the care of this most pleasant man.  I will be happy to follow him with you.   Pascal Lux, MD  Beeper #:  [phone]  04/06/2017 10:48 AM

## 2017-04-06 NOTE — Care Management Important Message (Signed)
Important Message  Patient Details  Name: Starleen BlueGrady A Coopman MRN: [id number] Date of Birth: 01/10/1931   Medicare Important Message Given:  Yes    Gwenette GreetBrenda S Trace Cederberg, RN 04/06/2017, 6:56 AM

## 2017-04-06 NOTE — Progress Notes (Signed)
[facility] INFECT[facility]OUS DISE[facility] PROGRESS NOTE Date of Admission:  04/01/2017     ID: Jack Jenkins is a 83 y.o. male with MAI and COPD exac Active Problems:   COPD (chronic obstructive pulmonary disease) (HCC)   Subjective: Out of unit. Gout in knee, s/p aspiration.  ROS  Eleven systems are reviewed and negative except per hpi  Medications:  Antibiotics Given (last 72 hours)    Date/Time Action Medication Dose   04/04/17 1000 Given   azithromycin (ZITHROMAX) tablet 250 mg 250 mg   04/05/17 1028 Given   ethambutol (MYAMBUTOL) tablet 1,200 mg 1,200 mg   04/05/17 1031 Given   rifampin (RIFADIN) capsule 600 mg 600 mg   04/05/17 1718 Given   azithromycin (ZITHROMAX) tablet 250 mg 250 mg     . albuterol  2.5 mg Nebulization Once  . aspirin EC  81 mg Oral Daily  . [START ON 04/07/2017] azithromycin  250 mg Oral Once per day on Mon Wed Fri  . clopidogrel  75 mg Oral Daily  . colchicine  0.6 mg Oral Daily  . docusate sodium  100 mg Oral BID  . ethambutol  1,200 mg Oral Once per day on Mon Wed Fri  . feeding supplement (NEPRO CARB STEADY)  237 mL Oral BID BM  . mouth rinse  15 mL Mouth Rinse BID  . metoprolol succinate  25 mg Oral Daily  . mometasone-formoterol  2 puff Inhalation BID  . multivitamin with minerals  1 tablet Oral Daily  . rifampin  600 mg Oral Once per day on Mon Wed Fri  . tiotropium  18 mcg Inhalation Daily    Objective: Vital signs in last 24 hours: Temp:  [98 F (36.7 C)-98.8 F (37.1 C)] 98 F (36.7 C) (11/27 1406) Pulse Rate:  [93-119] 93 (11/27 1406) Resp:  [16-33] 18 (11/27 1406) BP: (119-144)/(60-68) 119/60 (11/27 1406) SpO2:  [92 %-98 %] 96 % (11/27 1406) Weight:  [103.3 kg (227 lb 12.8 oz)] 103.3 kg (227 lb 12.8 oz) (11/27 0442) Constitutional: He is frail but oriented to person, place, and time. He appears well-developed and well-nourished. No distress.  HENT: anicteric Mouth/Throat: Oropharynx is clear and moist. No oropharyngeal exudate.   Cardiovascular: Normal rate, regular rhythm and normal heart sounds. Pulmonary/Chest:poor air movementt, rhonchi  Abdominal: Soft. Bowel sounds are normal. He exhibits no distension. There is no tenderness.  Lymphadenopathy:  He has no cervical adenopathy.  Neurological: He is alert and oriented to person, place, and time.  Skin: Skin is warm and dry. No rash noted. No erythema.  Psychiatric: He has a normal mood and affect. His behavior is normal.   Lab Results Recent Labs    04/05/17 0504 04/06/17 0550  WBC 2.9* 5.8  HGB 9.9* 11.4*  HCT 30.6* 35.1*  NA 143 143  K 4.2 4.8  CL 105 102  CO2 35* 33*  BUN 24* 22*  CREATININE 1.31* 1.32*    Microbiology: Results for orders placed or performed during the hospital encounter of 04/01/17  MRSA PCR Screening     Status: None   Collection Time: 04/03/17 10:23 AM  Result Value Ref Range Status   MRSA by PCR NEGATIVE NEGATIVE Final    Comment:        The GeneXpert MRSA Assay (FDA approved for NASAL specimens only), is one component of a comprehensive MRSA colonization surveillance program. It is not intended to diagnose MRSA infection nor to guide or monitor treatment for MRSA infections.  Studies/Results: Dg Knee 1-2 Views Left  Result Date: 04/06/2017 CLINICAL DATA:  Left knee pain and history of gout. EXAM: LEFT KNEE - 1-2 VIEW COMPARISON:  10/15/2011 FINDINGS: No evidence of fracture. There is a small suprapatellar joint effusion. Mild patellofemoral degenerative disease. Chondrocalcinosis involving the medial joint space with mild medial joint space narrowing. Calcification also seen adjacent to the lateral femoral condyle. No evidence of bony lesion or destruction. 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 Electronically Signed   By: Irish LackGlenn  Yamagata M.D.   On: 04/06/2017 09:28    Assessment/Plan: Jack Jenkins is a 83 y.o. male with COPD, recently dxed  MAI who started treatment 10/8 now with worsening hypoxia and AMS.  He has no fevers and wbc nml.  Clinically improving with treatment of COPD. His CT scan does not show extensive disease .  Recommendations Continue triple therapy for MAI. However MAI is unlikely the cause of his current exacerbation as it is usually a chronic and slowly progressive process. He can continue to follow with Dr Welton FlakesKhan for MAI treatment as otpt. No need to see me unless Dr Welton FlakesKhan requests.  Thank you very much for the consult. Will follow with you.  Mick SellDavid P Mithcell Schumpert   04/06/2017, 2:45 PM

## 2017-04-07 ENCOUNTER — Telehealth: Payer: Self-pay

## 2017-04-07 LAB — COMPREHENSIVE METABOLIC PANEL
ALBUMIN: 2.6 g/dL — AB (ref 3.5–5.0)
ALT: 31 U/L (ref 17–63)
ANION GAP: 7 (ref 5–15)
AST: 34 U/L (ref 15–41)
Alkaline Phosphatase: 71 U/L (ref 38–126)
BUN: 24 mg/dL — ABNORMAL HIGH (ref 6–20)
CALCIUM: 8.6 mg/dL — AB (ref 8.9–10.3)
CO2: 34 mmol/L — ABNORMAL HIGH (ref 22–32)
Chloride: 102 mmol/L (ref 101–111)
Creatinine, Ser: 1.4 mg/dL — ABNORMAL HIGH (ref 0.61–1.24)
GFR, EST AFRICAN AMERICAN: 52 mL/min — AB (ref >60)
GFR, EST NON AFRICAN AMERICAN: 45 mL/min — AB (ref >60)
Glucose, Bld: 129 mg/dL — ABNORMAL HIGH (ref 65–99)
POTASSIUM: 5.1 mmol/L (ref 3.5–5.1)
SODIUM: 143 mmol/L (ref 135–145)
TOTAL PROTEIN: 5.6 g/dL — AB (ref 6.5–8.1)
Total Bilirubin: 0.6 mg/dL (ref 0.3–1.2)

## 2017-04-07 LAB — CBC WITH DIFFERENTIAL/PLATELET
BASOS ABS: 0 10*3/uL (ref 0–0.1)
BASOS PCT: 0 %
Eosinophils Absolute: 0 10*3/uL (ref 0–0.7)
Eosinophils Relative: 0 %
HEMATOCRIT: 32.8 % — AB (ref 40.0–52.0)
Hemoglobin: 10.6 g/dL — ABNORMAL LOW (ref 13.0–18.0)
Lymphocytes Relative: 7 %
Lymphs Abs: 0.5 10*3/uL — ABNORMAL LOW (ref 1.0–3.6)
MCH: 31.3 pg (ref 26.0–34.0)
MCHC: 32.3 g/dL (ref 32.0–36.0)
MCV: 96.9 fL (ref 80.0–100.0)
MONO ABS: 0.5 10*3/uL (ref 0.2–1.0)
Monocytes Relative: 7 %
NEUTROS ABS: 5.8 10*3/uL (ref 1.4–6.5)
Neutrophils Relative %: 86 %
PLATELETS: 102 10*3/uL — AB (ref 150–440)
RBC: 3.39 MIL/uL — AB (ref 4.40–5.90)
RDW: 15.7 % — AB (ref 11.5–14.5)
WBC: 6.7 10*3/uL (ref 3.8–10.6)

## 2017-04-07 LAB — GLUCOSE, CAPILLARY: Glucose-Capillary: 109 mg/dL — ABNORMAL HIGH (ref 65–99)

## 2017-04-07 MED ORDER — COLCHICINE 0.6 MG PO TABS: 0.6000 mg | ORAL_TABLET | Freq: Every day | ORAL | 0 refills | Status: AC

## 2017-04-07 MED ORDER — ALLOPURINOL 100 MG PO TABS: 100.0000 mg | ORAL_TABLET | Freq: Every day | ORAL | 0 refills | Status: AC

## 2017-04-07 MED ORDER — NEPRO/CARBSTEADY PO LIQD: 237.0000 mL | Freq: Two times a day (BID) | ORAL | 0 refills | Status: AC

## 2017-04-07 NOTE — Progress Notes (Signed)
Pt is being discharged today, all discharge instructions reviewed with the patient, he and his wife verified understanding. IV X1 removed, all belongings packed and returned to him. He was wheeled out in a wheelchair by staff.

## 2017-04-07 NOTE — Progress Notes (Signed)
Physical Therapy Treatment Patient Details Name: Jack Jenkins MRN: [id number] DOB: 1930/11/14 Today's Date: 04/07/2017    History of Present Illness Pt admitted for COPD. Pt with complaints of SOB upon arrival. History includes CHF, COPD, cyocobacterium avium infection, gout, and CKD. Pt with recent bronchoscopy on 10/2.  Pt with stay complicated admission to CCU secondary to increased confusion and SOB symptoms. Re-evaluation done this date.     PT Comments    Pt has made good improvement towards goals this date. Increased independence noted with decreased pain in L knee s/p injection yesterday. Pt still limited in endurance secondary to O2 sats. All mobility performed on 3L of O2. Will continue to progress.   Follow Up Recommendations  Home health PT;Supervision for mobility/OOB     Equipment Recommendations  None recommended by PT    Recommendations for Other Services       Precautions / Restrictions Precautions Precautions: Fall Restrictions Weight Bearing Restrictions: No    Mobility  Bed Mobility Overal bed mobility: Needs Assistance Bed Mobility: Supine to Sit     Supine to sit: Min guard     General bed mobility comments: improved technique with decreased pain noted this session. Able to sit with ease at EOB  Transfers Overall transfer level: Needs assistance Equipment used: Rolling walker (2 wheeled) Transfers: Sit to/from Stand Sit to Stand: Min guard         General transfer comment: safe technique performed with RW used  Ambulation/Gait Ambulation/Gait assistance: Min guard Ambulation Distance (Feet): 70 Feet Assistive device: Rolling walker (2 wheeled) Gait Pattern/deviations: Step-through pattern     General Gait Details: ambulated in room several times with RW used. Safe technique noted with decreased pain and reciprocal gait pattern. Able to navigate turns without LOB. Upright posture noted. All mobility performed on 3L of O2 with sats  decreasing to 82% with exertion, limiting further ambulation. With seated rest break, sats improve to 93% within a few minutes.   Stairs            Wheelchair Mobility    Modified Rankin (Stroke Patients Only)       Balance                                            Cognition Arousal/Alertness: Awake/alert Behavior During Therapy: WFL for tasks assessed/performed Overall Cognitive Status: Within Functional Limits for tasks assessed                                        Exercises Other Exercises Other Exercises: deferred ther-ex this date as breakfast arrived.    General Comments        Pertinent Vitals/Pain Pain Assessment: Faces Faces Pain Scale: Hurts a little bit Pain Location: L knee secondary to gout Pain Descriptors / Indicators: Aching Pain Intervention(s): Limited activity within patient's tolerance    Home Living                      Prior Function            PT Goals (current goals can now be found in the care plan section) Acute Rehab PT Goals Patient Stated Goal: to go home PT Goal Formulation: With patient Time For Goal Achievement: 04/20/17 Potential to Achieve  Goals: Good Progress towards PT goals: Progressing toward goals    Frequency    Min 2X/week      PT Plan Current plan remains appropriate    Co-evaluation              AM-PAC PT "6 Clicks" Daily Activity  Outcome Measure  Difficulty turning over in bed (including adjusting bedclothes, sheets and blankets)?: None Difficulty moving from lying on back to sitting on the side of the bed? : Unable Difficulty sitting down on and standing up from a chair with arms (e.g., wheelchair, bedside commode, etc,.)?: Unable Help needed moving to and from a bed to chair (including a wheelchair)?: None Help needed walking in hospital room?: A Little Help needed climbing 3-5 steps with a railing? : A Little 6 Click Score: 16    End of  Session Equipment Utilized During Treatment: Gait belt;Oxygen Activity Tolerance: Patient tolerated treatment well Patient left: in chair;with chair alarm set Nurse Communication: Mobility status PT Visit Diagnosis: Unsteadiness on feet (R26.81);Muscle weakness (generalized) (M62.81);Difficulty in walking, not elsewhere classified (R26.2)     Time: [id number] PT Time Calculation (min) (ACUTE ONLY): 13 min  Charges:  $Gait Training: 8-22 mins                    G Codes:  Functional Assessment Tool Used: AM-PAC 6 Clicks Basic Mobility Functional Limitation: Mobility: Walking and moving around Mobility: Walking and Moving Around Current Status ([id number]([id number]): At least 40 percent but less than 60 percent impaired, limited or restricted Mobility: Walking and Moving Around Goal Status [id number]([id number]): At least 20 percent but less than 40 percent impaired, limited or restricted    Elizabeth PalauStephanie Oakes Mccready, PT, DPT [phone]    Jack Jenkins 04/07/2017, 10:04 AM

## 2017-04-07 NOTE — Discharge Summary (Signed)
[facility] [place] NAME: Jack Jenkins    MR#:  [id number]  DATE OF BIRTH:  10/29/1930  DATE OF ADMISSION:  04/01/2017 ADMITTING PHYSICIAN: Max Sane, MD  DATE OF DISCHARGE: 04/07/2017  PRIMARY CARE PHYSICIAN: [name]., MD    ADMISSION DIAGNOSIS:  Delirium [R41.0] Dyspnea [R06.00] Hypoxia [R09.02] COPD exacerbation (HCC) [J44.1] Moderate persistent asthmatic bronchitis with acute exacerbation [J45.41]  DISCHARGE DIAGNOSIS:  Active Problems:   COPD (chronic obstructive pulmonary disease) ([place])   SECONDARY DIAGNOSIS:   Past Medical History:  Diagnosis Date  . CHF (congestive heart failure) ([place])   . COPD (chronic obstructive pulmonary disease) ([place])   . Gout   . Renal disorder     HOSPITAL COURSE:   83 year old male with history of COPD and MAC on oral treatment who presents with shortness of breath.  1. Acute on chronic hypoxic respiratory failure due  to COPD exacerbation  Patient will need 2 L of oxygen at rest and 4 L on ambulation. This was discussed with patient and patient's wife.   Patient was ruled out for pulmonary emboli Patient has chronically elevated left diaphragm He will follow-up with his pulmonologist as an outpatient in one week.  2. Acute COPD exacerbation with CO2 retention: Patient's CO2 has improved. He does not require steroids at this time. Continue inhalers and oxygen therapy..  3. Recent diagnosis of MAC:  patient was evaluated infectious disease consultant while in the hospital. Recommendations are to continue rifampin, azithromycin, ethambutol   4. Chronic systolic heart failure ejection fraction 40-50%: Patient does not have signs of exacerbation at this time.  5. Chronic kidney disease stage III: Creatinine is at baseline  6. Lactic acidosis: This has resolved. 7. Hyperkalemia: This has resolved.  8. Acute metabolic encephalopathy in the setting of CO2 retention  with PCO2 of 83 Patient was transferred to stepdown unit due to encephalopathy from CO2 retention.  He is now back at baseline.  9. Thrombocytopenia: Platelets have improved. He will need follow-up EC. 10. Acute gout flare with Left knee pain: Patient was evaluated by orthopedic surgery. He underwent aspiration of the left knee effusion. It is consistent with gout. He was given IV steroid. He reports improved symptoms. He will continue: Colchine and allopurinol.   Physical therapy has recommended home health at discharge.   DISCHARGE CONDITIONS AND DIET:   Stable Cardiac diet  CONSULTS OBTAINED:  Treatment Team:  Allyne Gee, MD Leonel Ramsay, MD Poggi, Marshall Cork, MD  DRUG ALLERGIES:  No Known Allergies  DISCHARGE MEDICATIONS:   Current Discharge Medication List    START taking these medications   Details  colchicine 0.6 MG tablet Take 1 tablet (0.6 mg total) by mouth daily. Qty: 15 tablet, Refills: 0    Nutritional Supplements (FEEDING SUPPLEMENT, NEPRO CARB STEADY,) LIQD Take 237 mLs by mouth 2 (two) times daily between meals. Qty: 14220 mL, Refills: 0      CONTINUE these medications which have CHANGED   Details  allopurinol (ZYLOPRIM) 100 MG tablet Take 1 tablet (100 mg total) by mouth daily. Qty: 30 tablet, Refills: 0      CONTINUE these medications which have NOT CHANGED   Details  aspirin EC 81 MG EC tablet Take 1 tablet (81 mg total) by mouth daily. Qty: 30 tablet, Refills: 0    azithromycin (ZITHROMAX) 250 MG tablet Take 1 tablet by mouth 3 (three) times a week.    ethambutol (MYAMBUTOL) 400  MG tablet Take 1,200 mg by mouth 3 (three) times a week.    furosemide (LASIX) 40 MG tablet Take 1 tablet (40 mg total) by mouth daily as needed for fluid. Qty: 90 tablet, Refills: 3    metoprolol succinate (TOPROL XL) 25 MG 24 hr tablet Take 1 tablet (25 mg total) by mouth daily. Qty: 90 tablet, Refills: 3    rifampin (RIFADIN) 300 MG capsule Take 600  mg by mouth 3 (three) times a week.    tiotropium (SPIRIVA) 18 MCG inhalation capsule Place 1 capsule (18 mcg total) into inhaler and inhale daily. Qty: 30 capsule, Refills: 12    clopidogrel (PLAVIX) 75 MG tablet Take 1 tablet (75 mg total) by mouth daily. Qty: 30 tablet, Refills: 0    mometasone-formoterol (DULERA) 100-5 MCG/ACT AERO Inhale 2 puffs into the lungs 2 (two) times daily. Qty: 1 Inhaler, Refills: 1    nitroGLYCERIN (NITROSTAT) 0.4 MG SL tablet Place 1 tablet (0.4 mg total) under the tongue every 5 (five) minutes as needed for chest pain. Qty: 30 tablet, Refills: 0      STOP taking these medications     dextromethorphan-guaiFENesin (MUCINEX DM) 30-600 MG 12hr tablet      ranitidine (ZANTAC) 150 MG tablet           Today   CHIEF COMPLAINT:   Reports that knee pain has improved. He is walking. He ambulated with physical therapy's morning. Still has a cough however shortness of breath has improved.  VITAL SIGNS:  Blood pressure 137/71, pulse 99, temperature 98.8 F (37.1 C), temperature source Oral, resp. rate 16, height _0  (1.626 m), weight 102.6 kg (226 lb 3.2 oz), SpO2 93 %.   REVIEW OF SYSTEMS:  Review of Systems  Constitutional: Negative.  Negative for chills, fever and malaise/fatigue.  HENT: Negative.  Negative for ear discharge, ear pain, hearing loss, nosebleeds and sore throat.   Eyes: Negative.  Negative for blurred vision and pain.  Respiratory: Positive for cough. Negative for hemoptysis, shortness of breath (better) and wheezing.   Cardiovascular: Negative.  Negative for chest pain, palpitations and leg swelling.  Gastrointestinal: Negative.  Negative for abdominal pain, blood in stool, diarrhea, nausea and vomiting.  Genitourinary: Negative.  Negative for dysuria.  Musculoskeletal: Positive for joint pain (knee pain better). Negative for back pain.  Skin: Negative.   Neurological: Negative for dizziness, tremors, speech change, focal  weakness, seizures and headaches.  Endo/Heme/Allergies: Negative.  Does not bruise/bleed easily.  Psychiatric/Behavioral: Negative.  Negative for depression, hallucinations and suicidal ideas.     PHYSICAL EXAMINATION:  GENERAL:  83 y.o.-year-old patient lying in the bed with no acute distress.  NECK:  Supple, no jugular venous distention. No thyroid enlargement, no tenderness.  LUNGS: Normal breath sounds bilaterally, no wheezing, rales,rhonchi  No use of accessory muscles of respiration.  CARDIOVASCULAR: S1, S2 normal. No murmurs, rubs, or gallops.  ABDOMEN: Soft, non-tender, non-distended. Bowel sounds present. No organomegaly or mass.  EXTREMITIES: No pedal edema, cyanosis, or clubbing.  Left knee with very minimal swelling. No joint line tenderness. PSYCHIATRIC: The patient is alert and oriented x 3.  SKIN: No obvious rash, lesion, or ulcer.   DATA REVIEW:   CBC Recent Labs  Lab 04/07/17 0404  WBC 6.7  HGB 10.6*  HCT 32.8*  PLT 102*    Chemistries  Recent Labs  Lab 04/05/17 0504  04/07/17 0404  NA 143   < > 143  K 4.2   < > 5.1  CL 105   < > 102  CO2 35*   < > 34*  GLUCOSE 93   < > 129*  BUN 24*   < > 24*  CREATININE 1.31*   < > 1.40*  CALCIUM 8.2*   < > 8.6*  MG 2.5*  --   --   AST 36   < > 34  ALT 21   < > 31  ALKPHOS 55   < > 71  BILITOT 0.5   < > 0.6   < > = values in this interval not displayed.    Cardiac Enzymes Recent Labs  Lab 04/01/17 0749  TROPONINI <0.03    Microbiology Results  _0 @  RADIOLOGY:  Dg Knee 1-2 Views Left  Result Date: 04/06/2017 CLINICAL DATA:  Left knee pain and history of gout. EXAM: LEFT KNEE - 1-2 VIEW COMPARISON:  10/15/2011 FINDINGS: No evidence of fracture. There is a small suprapatellar joint effusion. Mild patellofemoral degenerative disease. Chondrocalcinosis involving the medial joint space with mild medial joint space narrowing. Calcification also seen adjacent to the lateral femoral condyle. No  evidence of bony lesion or destruction. 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 Electronically Signed   By: Aletta Edouard M.D.   On: 04/06/2017 09:28      Current Discharge Medication List    START taking these medications   Details  colchicine 0.6 MG tablet Take 1 tablet (0.6 mg total) by mouth daily. Qty: 15 tablet, Refills: 0    Nutritional Supplements (FEEDING SUPPLEMENT, NEPRO CARB STEADY,) LIQD Take 237 mLs by mouth 2 (two) times daily between meals. Qty: 14220 mL, Refills: 0      CONTINUE these medications which have CHANGED   Details  allopurinol (ZYLOPRIM) 100 MG tablet Take 1 tablet (100 mg total) by mouth daily. Qty: 30 tablet, Refills: 0      CONTINUE these medications which have NOT CHANGED   Details  aspirin EC 81 MG EC tablet Take 1 tablet (81 mg total) by mouth daily. Qty: 30 tablet, Refills: 0    azithromycin (ZITHROMAX) 250 MG tablet Take 1 tablet by mouth 3 (three) times a week.    ethambutol (MYAMBUTOL) 400 MG tablet Take 1,200 mg by mouth 3 (three) times a week.    furosemide (LASIX) 40 MG tablet Take 1 tablet (40 mg total) by mouth daily as needed for fluid. Qty: 90 tablet, Refills: 3    metoprolol succinate (TOPROL XL) 25 MG 24 hr tablet Take 1 tablet (25 mg total) by mouth daily. Qty: 90 tablet, Refills: 3    rifampin (RIFADIN) 300 MG capsule Take 600 mg by mouth 3 (three) times a week.    tiotropium (SPIRIVA) 18 MCG inhalation capsule Place 1 capsule (18 mcg total) into inhaler and inhale daily. Qty: 30 capsule, Refills: 12    clopidogrel (PLAVIX) 75 MG tablet Take 1 tablet (75 mg total) by mouth daily. Qty: 30 tablet, Refills: 0    mometasone-formoterol (DULERA) 100-5 MCG/ACT AERO Inhale 2 puffs into the lungs 2 (two) times daily. Qty: 1 Inhaler, Refills: 1    nitroGLYCERIN (NITROSTAT) 0.4 MG SL tablet Place 1 tablet (0.4 mg total) under the tongue every 5 (five) minutes as  needed for chest pain. Qty: 30 tablet, Refills: 0      STOP taking these medications     dextromethorphan-guaiFENesin (MUCINEX DM) 30-600 MG 12hr tablet      ranitidine (ZANTAC) 150 MG tablet  Management plans discussed with the patient and wife and they are in agreement. Stable for discharge home with [facility]  Patient should follow up with pcp  CODE STATUS:     Code Status Orders  (From admission, onward)        Start     Ordered   04/01/17 1104  Full code  Continuous     04/01/17 1103    Code Status History    Date Active Date Inactive Code Status Order ID Comments User Context   10/07/2016 15:35 10/08/2016 18:31 Full Code [id number]  Max Sane, MD Inpatient   04/21/2015 15:16 04/24/2015 18:44 Full Code [id number]  Idelle Crouch, MD Inpatient    Advance Directive Documentation     Most Recent Value  Type of Advance Directive  Healthcare Power of Attorney, Living will  Pre-existing out of facility DNR order (yellow form or pink MOST form)  No data  "MOST" Form in Place?  No data      TOTAL TIME TAKING CARE OF THIS PATIENT: 38 minutes.    Note: This dictation was prepared with Dragon dictation along with smaller phrase technology. Any transcriptional errors that result from this process are unintentional.  Zyshawn Bohnenkamp M.D on 04/07/2017 at 10:42 AM  Between 7am to 6pm - Pager - [phone] After 6pm go to [email or web address] - password [email or web address] [facility]  Office  [phone]  CC: Primary care physician; [name]., MD

## 2017-04-07 NOTE — Telephone Encounter (Signed)
LMTCB to set up AWV with [facility], and physical/followup to AWV for 2019.  -MM

## 2017-04-07 NOTE — Care Management (Signed)
[facility] unable to provide Skilled Nursing in the home. Spoke with wife and [name] at the bedside. Chose [facility]. Left voice mail for Sherolyn Bubarystal Leonard, RN representative for [facility]. Discharge to home today per Dr. Juliene PinaMody. Wife will transport. Gwenette GreetBrenda S Shailyn Weyandt RN MSN CCM Care Management [phone]

## 2017-04-08 ENCOUNTER — Telehealth: Payer: Self-pay

## 2017-04-08 NOTE — Telephone Encounter (Signed)
FYI  Mrs. Taher called concerned about the patient's weight and swelling.  She states that the patient was discharged from the hospital yesterday after 7 day admission.  At the time of D/C he had swelling in his feet and legs and weight was 226.  He was instructed to use O2 at 2 LPM during the day and 4 LPM at night.  Wife states that at 4 lpm the patient slept until 5 this morning and did pretty well.  When they checked his weight this morning it was 227 and he had the same amount of swelling.  Patient was having trouble keeping his O2 sat in the 90's on 2 LPM during the day so she increased him to 3 lpm since this was what he was on prior to admission.  She said he went to shave and his O2 went to 70. When asked about how much oxygen he was on when shaving she said he had taken the O2 off.  After sitting with the O2 at 3 lpm the patient had O2 sat of 94% and HR of 106 while I was talking with the wife.  Wife was instructed to have patient keep oxygen on at all times, especially when exerting himself, until he was seen by our office of Dr Park BreedKahn.  She was re-assured that if his swelling had not changed and his weight was only up 1 pound she should continue to monitor the weight.  Advised that 1 pound could be the difference in the scales.  She said she was concerned because his weight prior to the admission was much less.  However the d/c paperwork from the hospital did indicate his weight was 226 at d/c.   Wife has been instructed to monitor weight, O2 sat and HR.  She is to contact Dr Park BreedKahn about staying on 3 LPM during the day and 4 at night.  She is to call back or go to the ED for if patient's O2 sat drops under 90% and does not come back up, any increased swelling, more difficulty breathing or weight gain of 2 or more pounds in a day.  She verbalized understanding and they have appointment in 5 days for office visit here.

## 2017-04-09 LAB — BODY FLUID CULTURE: CULTURE: NO GROWTH

## 2017-04-13 ENCOUNTER — Encounter: Payer: Self-pay | Admitting: Family Medicine

## 2017-04-13 ENCOUNTER — Ambulatory Visit: Payer: Medicare Other | Admitting: Family Medicine

## 2017-04-13 VITALS — BP 118/62 | HR 88 | Temp 97.8°F | Resp 18 | Wt 216.0 lb

## 2017-04-13 DIAGNOSIS — Z09 Encounter for follow-up examination after completed treatment for conditions other than malignant neoplasm: Secondary | ICD-10-CM | POA: Diagnosis not present

## 2017-04-13 DIAGNOSIS — Z23 Encounter for immunization: Secondary | ICD-10-CM

## 2017-04-13 DIAGNOSIS — M109 Gout, unspecified: Secondary | ICD-10-CM | POA: Diagnosis not present

## 2017-04-13 DIAGNOSIS — N183 Chronic kidney disease, stage 3 unspecified: Secondary | ICD-10-CM

## 2017-04-13 DIAGNOSIS — J441 Chronic obstructive pulmonary disease with (acute) exacerbation: Secondary | ICD-10-CM

## 2017-04-13 DIAGNOSIS — I509 Heart failure, unspecified: Secondary | ICD-10-CM

## 2017-04-13 LAB — CBC WITH DIFFERENTIAL/PLATELET
BASOS ABS: 7 {cells}/uL (ref 0–200)
BASOS PCT: 0.1 %
EOS ABS: 348 {cells}/uL (ref 15–500)
Eosinophils Relative: 4.9 %
HEMATOCRIT: 33.7 % — AB (ref 38.5–50.0)
HEMOGLOBIN: 10.8 g/dL — AB (ref 13.2–17.1)
Lymphs Abs: 646 cells/uL — ABNORMAL LOW (ref 850–3900)
MCH: 30.6 pg (ref 27.0–33.0)
MCHC: 32 g/dL (ref 32.0–36.0)
MCV: 95.5 fL (ref 80.0–100.0)
MONOS PCT: 7.7 %
MPV: 10.7 fL (ref 7.5–12.5)
NEUTROS ABS: 5552 {cells}/uL (ref 1500–7800)
Neutrophils Relative %: 78.2 %
Platelets: 210 10*3/uL (ref 140–400)
RBC: 3.53 10*6/uL — ABNORMAL LOW (ref 4.20–5.80)
RDW: 12.6 % (ref 11.0–15.0)
Total Lymphocyte: 9.1 %
WBC: 7.1 10*3/uL (ref 3.8–10.8)
WBCMIX: 547 {cells}/uL (ref 200–950)

## 2017-04-13 LAB — COMPLETE METABOLIC PANEL WITH GFR
AG RATIO: 1.1 (calc) (ref 1.0–2.5)
ALBUMIN MSPROF: 3 g/dL — AB (ref 3.6–5.1)
ALT: 19 U/L (ref 9–46)
AST: 19 U/L (ref 10–35)
Alkaline phosphatase (APISO): 90 U/L (ref 40–115)
BILIRUBIN TOTAL: 0.5 mg/dL (ref 0.2–1.2)
BUN/Creatinine Ratio: 17 (calc) (ref 6–22)
BUN: 24 mg/dL (ref 7–25)
CALCIUM: 9.1 mg/dL (ref 8.6–10.3)
CO2: 43 mmol/L — ABNORMAL HIGH (ref 20–32)
CREATININE: 1.42 mg/dL — AB (ref 0.70–1.11)
Chloride: 96 mmol/L — ABNORMAL LOW (ref 98–110)
GFR, Est African American: 52 mL/min/{1.73_m2} — ABNORMAL LOW (ref >=60)
GFR, Est Non African American: 45 mL/min/{1.73_m2} — ABNORMAL LOW (ref >=60)
GLOBULIN: 2.7 g/dL (ref 1.9–3.7)
Glucose, Bld: 166 mg/dL — ABNORMAL HIGH (ref 65–99)
POTASSIUM: 4.6 mmol/L (ref 3.5–5.3)
SODIUM: 146 mmol/L (ref 135–146)
TOTAL PROTEIN: 5.7 g/dL — AB (ref 6.1–8.1)

## 2017-04-13 LAB — URIC ACID: Uric Acid, Serum: 7.1 mg/dL (ref 4.0–8.0)

## 2017-04-13 NOTE — Progress Notes (Signed)
Patient: Jack Jenkins Male    DOB: [birth year]   83 y.o.   MRN: [id number] Visit Date: 04/13/2017  Today's Provider: Megan Mansichard Gilbert Jr, MD   Chief Complaint  Patient presents with  . Hospitalization Follow-up   Subjective:    HPI Hsp stay 04/01/17-04/07/17  Admit dx- Delirium       Dyspnea      Hypoxia      COPD exacerbation      Moderate persistent asthmatic bronchitis with acute exacerbation     R/O PE, no signs of CHF exacerbation, start colchicine and allopurinol for gout flare.  Wife called on 04/08/17 concerned about pt weight. He weighted 226 when he got home from the hospital but has since lost back down to 216 today. Pt reports that he is still short of breath but he is feeling better.      No Known Allergies   Current Outpatient Medications:  .  allopurinol (ZYLOPRIM) 100 MG tablet, Take 1 tablet (100 mg total) by mouth daily., Disp: 30 tablet, Rfl: 0 .  aspirin EC 81 MG EC tablet, Take 1 tablet (81 mg total) by mouth daily., Disp: 30 tablet, Rfl: 0 .  azithromycin (ZITHROMAX) 250 MG tablet, Take 1 tablet by mouth 3 (three) times a week., Disp: , Rfl:  .  colchicine 0.6 MG tablet, Take 1 tablet (0.6 mg total) by mouth daily., Disp: 15 tablet, Rfl: 0 .  ethambutol (MYAMBUTOL) 400 MG tablet, Take 1,200 mg by mouth 3 (three) times a week., Disp: , Rfl:  .  furosemide (LASIX) 40 MG tablet, Take 1 tablet (40 mg total) by mouth daily as needed for fluid., Disp: 90 tablet, Rfl: 3 .  metoprolol succinate (TOPROL XL) 25 MG 24 hr tablet, Take 1 tablet (25 mg total) by mouth daily., Disp: 90 tablet, Rfl: 3 .  mometasone-formoterol (DULERA) 100-5 MCG/ACT AERO, Inhale 2 puffs into the lungs 2 (two) times daily., Disp: 1 Inhaler, Rfl: 1 .  rifampin (RIFADIN) 300 MG capsule, Take 600 mg by mouth 3 (three) times a week., Disp: , Rfl:  .  tiotropium (SPIRIVA) 18 MCG inhalation capsule, Place 1 capsule (18 mcg total) into inhaler and inhale daily., Disp: 30 capsule, Rfl:  12 .  clopidogrel (PLAVIX) 75 MG tablet, Take 1 tablet (75 mg total) by mouth daily. (Patient not taking: Reported on 10/09/2016), Disp: 30 tablet, Rfl: 0 .  nitroGLYCERIN (NITROSTAT) 0.4 MG SL tablet, Place 1 tablet (0.4 mg total) under the tongue every 5 (five) minutes as needed for chest pain. (Patient not taking: Reported on 10/09/2016), Disp: 30 tablet, Rfl: 0 .  Nutritional Supplements (FEEDING SUPPLEMENT, NEPRO CARB STEADY,) LIQD, Take 237 mLs by mouth 2 (two) times daily between meals. (Patient not taking: Reported on 04/13/2017), Disp: 14220 mL, Rfl: 0  Current Facility-Administered Medications:  .  albuterol (PROVENTIL) (2.5 MG/3ML) 0.083% nebulizer solution 2.5 mg, 2.5 mg, Nebulization, Once, Chrismon, Jodell Ciproennis E, PA  Review of Systems  Constitutional: Negative.   HENT: Negative.   Eyes: Negative.   Respiratory: Positive for cough and shortness of breath.   Cardiovascular: Negative.   Gastrointestinal: Negative.   Endocrine: Negative.   Genitourinary: Negative.   Musculoskeletal: Negative.   Skin: Negative.   Allergic/Immunologic: Negative.   Neurological: Negative.   Hematological: Negative.   Psychiatric/Behavioral: Negative.     Social History   Tobacco Use  . Smoking status: Never Smoker  . Smokeless tobacco: Never Used  Substance Use Topics  .  Alcohol use: No    Alcohol/week: 0.0 oz   Objective:   BP 118/62 (BP Location: Left Arm, Patient Position: Sitting, Cuff Size: Normal)   Pulse 88   Temp 97.8 F (36.6 C) (Oral)   Resp 18   Wt 216 lb (98 kg)   SpO2 (!) 87% Comment: 95% when rested after walking in the exam room. 3L of ox  BMI 37.08 kg/m  Vitals:   04/13/17 0829  BP: 118/62  Pulse: 88  Resp: 18  Temp: 97.8 F (36.6 C)  TempSrc: Oral  SpO2: (!) 87%  Weight: 216 lb (98 kg)      Physical Exam  Constitutional: He is oriented to person, place, and time. He appears well-developed and well-nourished.  Eyes: Conjunctivae and EOM are normal. Pupils are  equal, round, and reactive to light.  Neck: Normal range of motion. Neck supple.  Cardiovascular: Normal rate, regular rhythm, normal heart sounds and intact distal pulses.  Pulmonary/Chest: Effort normal. He has wheezes (mild exspiritory wheezes).  Musculoskeletal: Normal range of motion. He exhibits edema.  Neurological: He is alert and oriented to person, place, and time. He has normal reflexes.  Skin: Skin is warm and dry.  Psychiatric: He has a normal mood and affect. His behavior is normal. Judgment and thought content normal.        Assessment & Plan:     1. COPD exacerbation (HCC) Wears Oxygen . - CBC with Differential/Platelet  2. Chronic congestive heart failure, unspecified heart failure type (HCC)   3. Hospital discharge follow-up  4. Gout of right knee, unspecified cause, unspecified chronicity  - Uric acid  5. Stage 3 chronic kidney disease (HCC)  - Comprehensive metabolic panel  6. Need for influenza vaccination  - Flu vaccine HIGH DOSE PF  7. Need for pneumococcal vaccination  - Pneumococcal polysaccharide vaccine 23-valent greater than or equal to 2yo subcutaneous/IM     HPI, Exam, and A&P Transcribed under the direction and in the presence of Richard L. Wendelyn BreslowGilbert Jr, MD  Electronically Signed: Silvio PateBrittany O'Dell, CMA  I have done the exam and reviewed the above chart and it is accurate to the best of my knowledge. [employer]  technology has been used in this note in any air is in the dictation or transcription are unintentional.  Megan Mansichard Gilbert Jr, MD  [facility]

## 2017-05-04 DIAGNOSIS — I5022 Chronic systolic (congestive) heart failure: Secondary | ICD-10-CM | POA: Diagnosis not present

## 2017-05-04 DIAGNOSIS — M1991 Primary osteoarthritis, unspecified site: Secondary | ICD-10-CM | POA: Diagnosis not present

## 2017-05-04 DIAGNOSIS — J4541 Moderate persistent asthma with (acute) exacerbation: Secondary | ICD-10-CM | POA: Diagnosis not present

## 2017-05-04 DIAGNOSIS — J9611 Chronic respiratory failure with hypoxia: Secondary | ICD-10-CM | POA: Diagnosis not present

## 2017-05-04 DIAGNOSIS — M109 Gout, unspecified: Secondary | ICD-10-CM | POA: Diagnosis not present

## 2017-05-04 DIAGNOSIS — A310 Pulmonary mycobacterial infection: Secondary | ICD-10-CM | POA: Diagnosis not present

## 2017-05-04 DIAGNOSIS — N183 Chronic kidney disease, stage 3 (moderate): Secondary | ICD-10-CM | POA: Diagnosis not present

## 2017-05-04 DIAGNOSIS — J441 Chronic obstructive pulmonary disease with (acute) exacerbation: Secondary | ICD-10-CM | POA: Diagnosis not present

## 2017-05-25 IMAGING — CR DG CHEST 2V
1 series · 2 of 2 positions shown · non-contrast
Comparison: 07/02/2014

CLINICAL DATA: 84-year-old male with a history of physical exam
findings on auscultation.

EXAM:
CHEST - 2 VIEW

[Series 1: dg chest 2 view · 0.14mm/px · 2 of 2 slices shown]
[im 1/2]
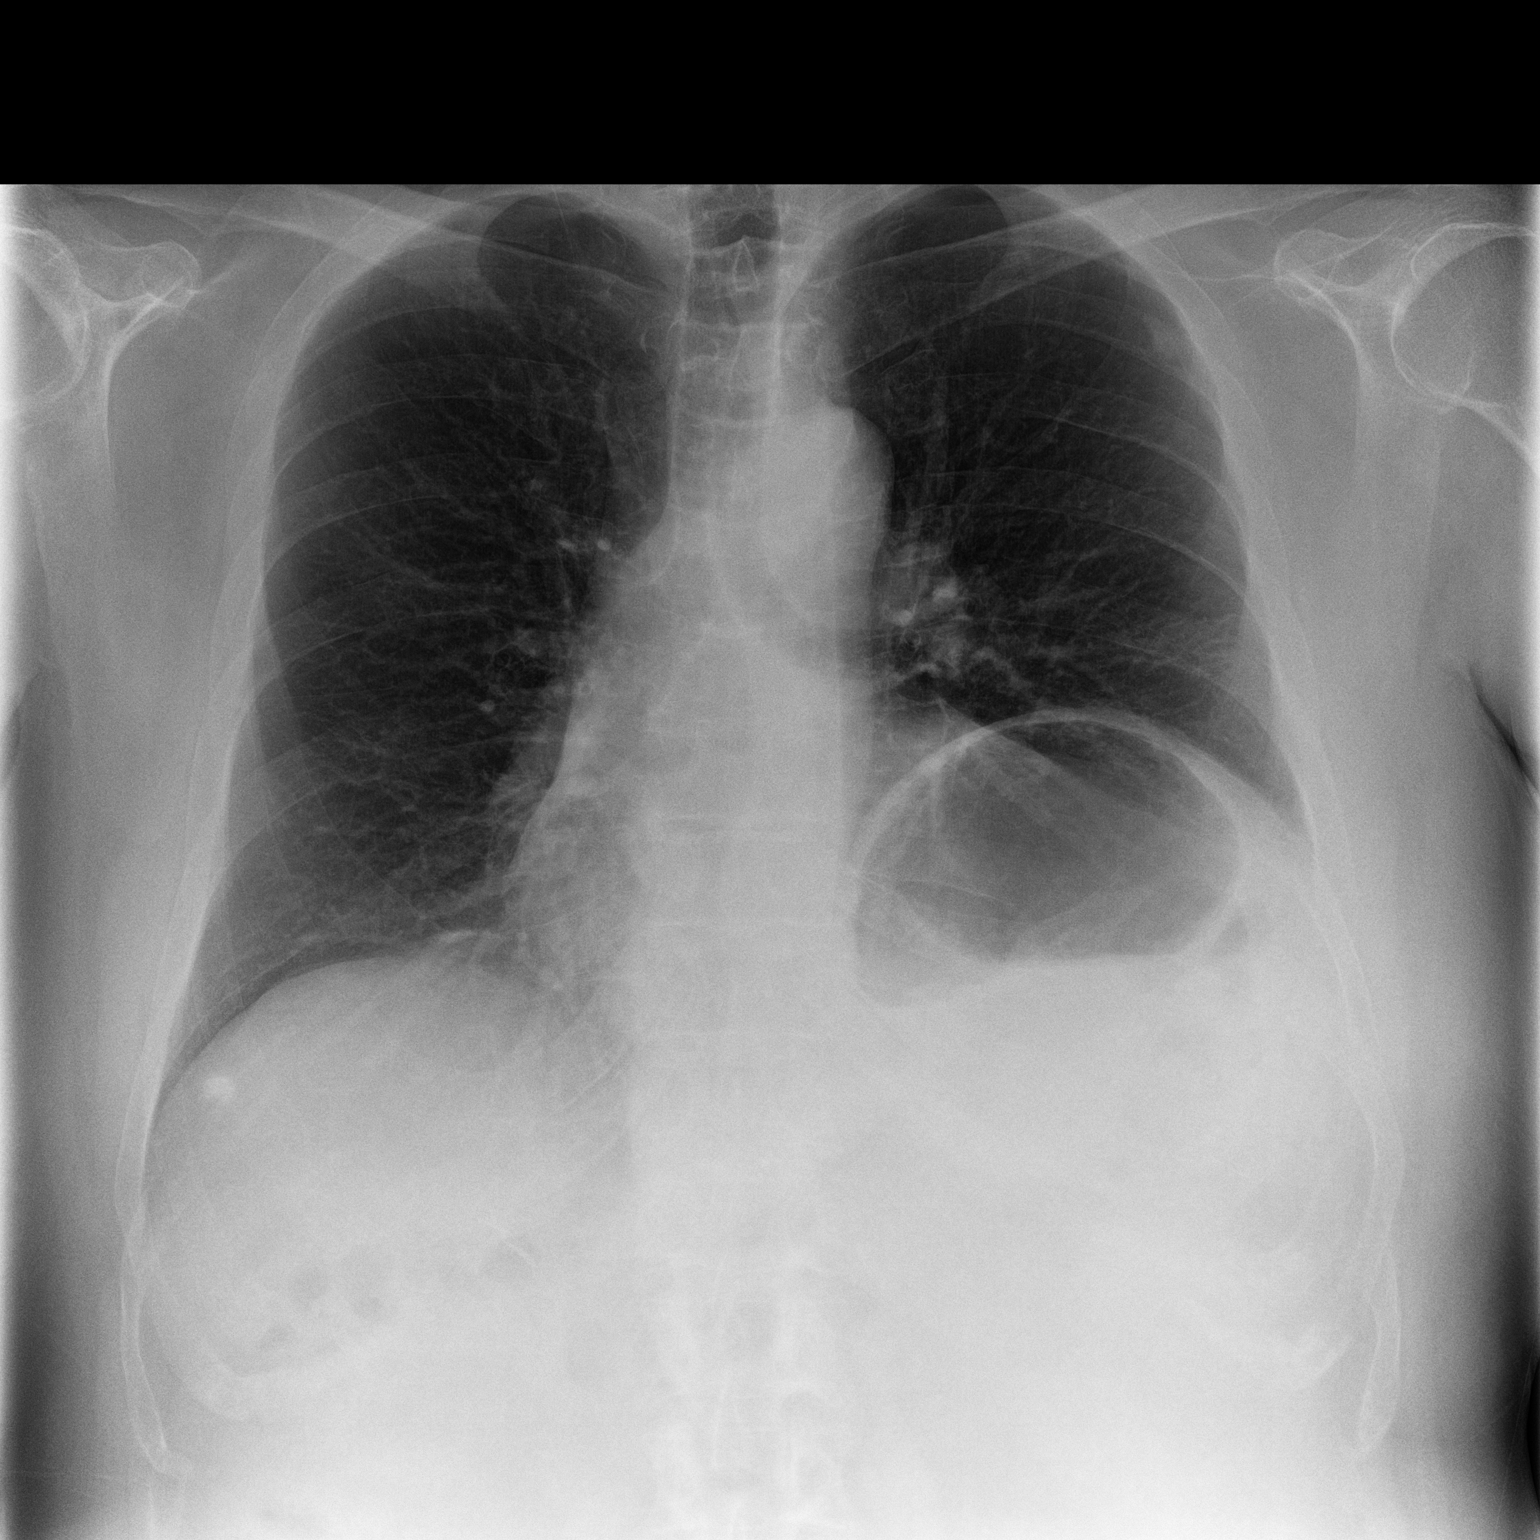
[im 2/2]
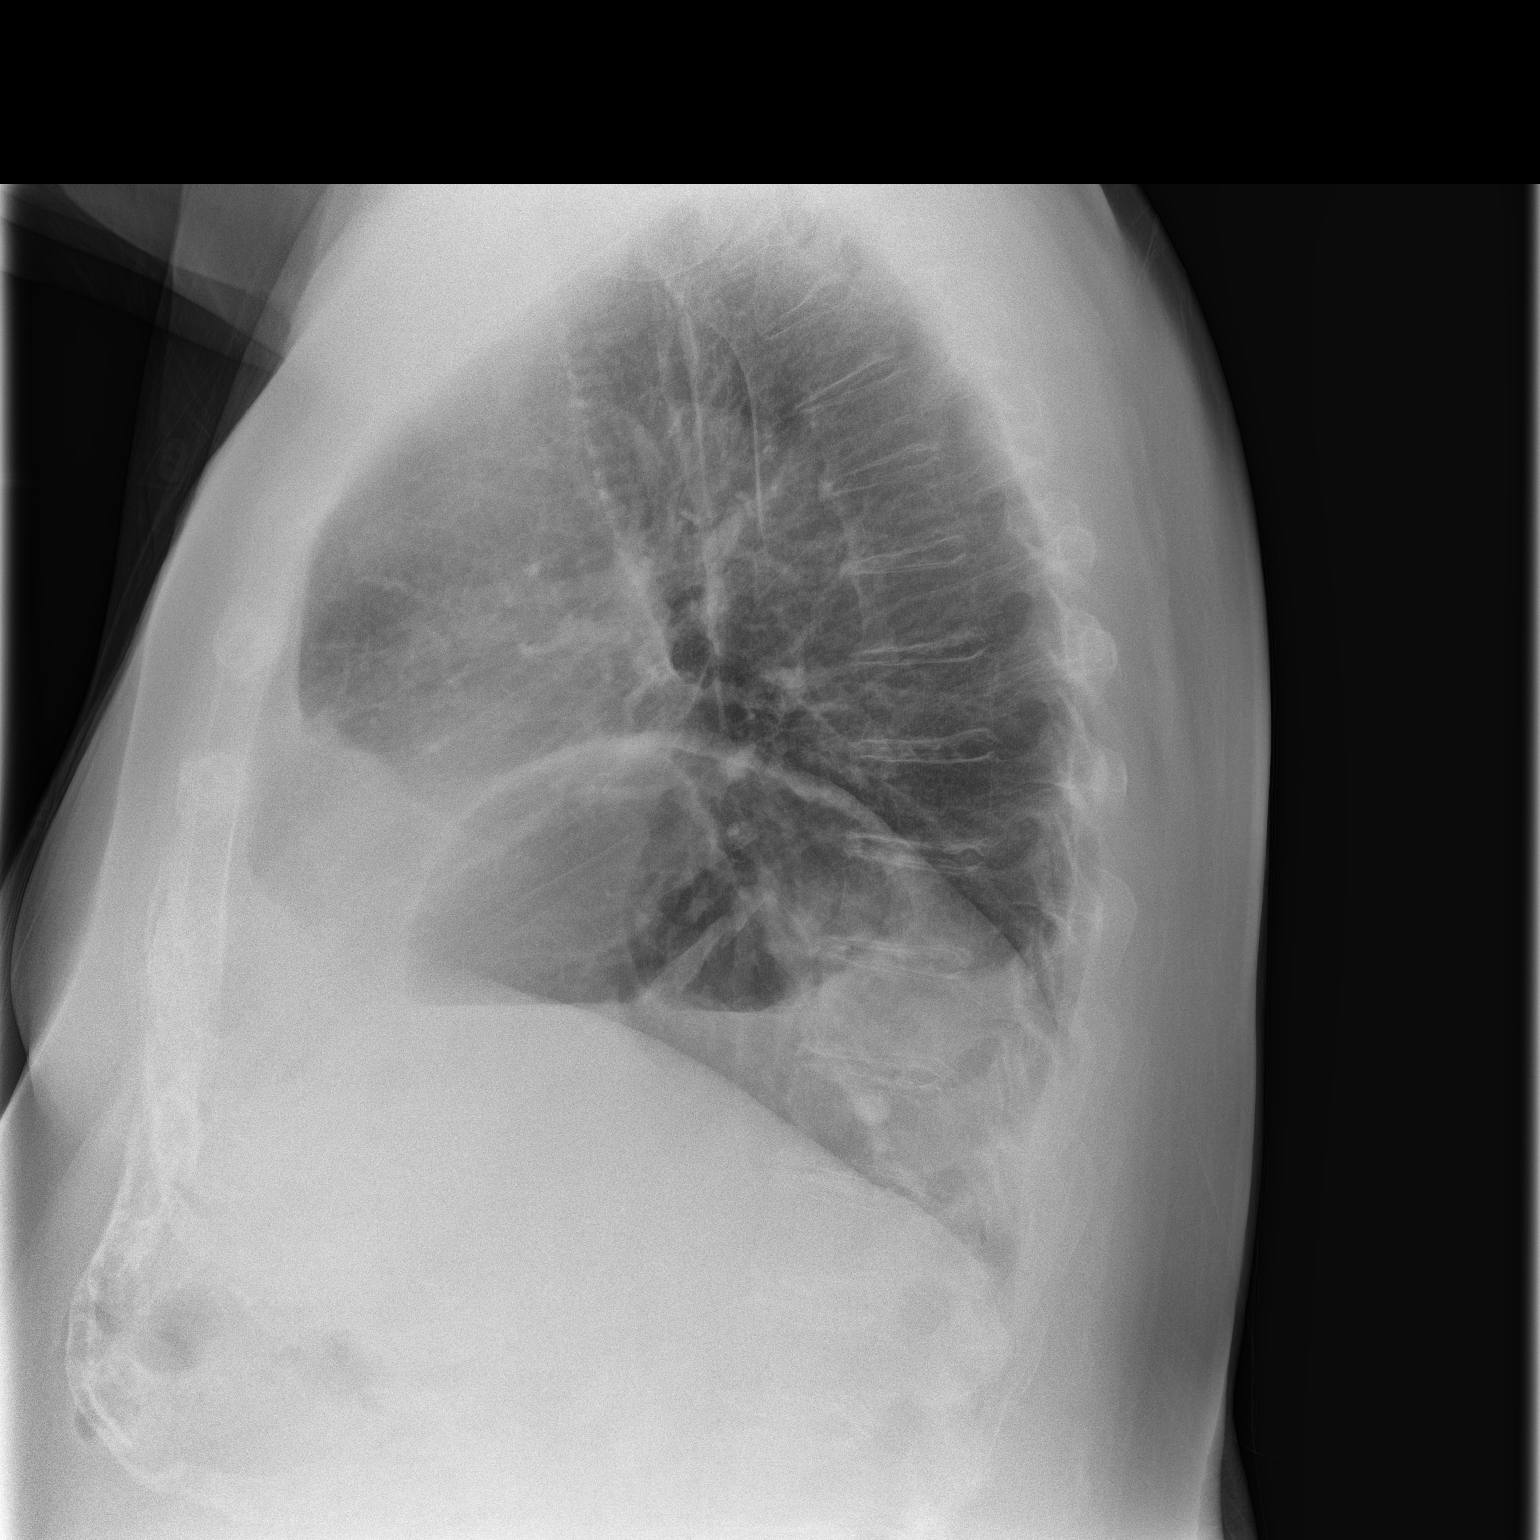

[2 of 2 positions shown; findings below may reference images not displayed]

FINDINGS: Cardiomediastinal silhouette unchanged. No confluent airspace
disease, pneumothorax, or pleural effusion.

Calcified granuloma at the base the right lung, as was seen on prior
CT and chest x-ray.

Persistent elevation of the left hemidiaphragm.

No displaced fracture.

Unremarkable appearance of the upper abdomen.
IMPRESSION: No radiographic evidence of acute cardiopulmonary disease.

Persistent elevation of the left hemidiaphragm.

## 2017-06-07 ENCOUNTER — Encounter: Payer: Self-pay | Admitting: Internal Medicine

## 2017-06-07 ENCOUNTER — Ambulatory Visit (INDEPENDENT_AMBULATORY_CARE_PROVIDER_SITE_OTHER): Payer: Medicare Other | Admitting: Internal Medicine

## 2017-06-07 VITALS — BP 107/37 | HR 70 | Resp 16 | Ht 69.0 in | Wt 212.8 lb

## 2017-06-07 DIAGNOSIS — J449 Chronic obstructive pulmonary disease, unspecified: Secondary | ICD-10-CM | POA: Diagnosis not present

## 2017-06-07 DIAGNOSIS — J8410 Pulmonary fibrosis, unspecified: Secondary | ICD-10-CM | POA: Diagnosis not present

## 2017-06-07 DIAGNOSIS — G4730 Sleep apnea, unspecified: Secondary | ICD-10-CM | POA: Insufficient documentation

## 2017-06-07 DIAGNOSIS — I5032 Chronic diastolic (congestive) heart failure: Secondary | ICD-10-CM

## 2017-06-07 DIAGNOSIS — J849 Interstitial pulmonary disease, unspecified: Secondary | ICD-10-CM | POA: Insufficient documentation

## 2017-06-07 DIAGNOSIS — J479 Bronchiectasis, uncomplicated: Secondary | ICD-10-CM | POA: Insufficient documentation

## 2017-06-07 DIAGNOSIS — A319 Mycobacterial infection, unspecified: Secondary | ICD-10-CM | POA: Diagnosis not present

## 2017-06-07 DIAGNOSIS — Z9981 Dependence on supplemental oxygen: Secondary | ICD-10-CM | POA: Insufficient documentation

## 2017-06-07 DIAGNOSIS — J9611 Chronic respiratory failure with hypoxia: Secondary | ICD-10-CM | POA: Diagnosis not present

## 2017-06-07 DIAGNOSIS — G4714 Hypersomnia due to medical condition: Secondary | ICD-10-CM | POA: Insufficient documentation

## 2017-06-07 NOTE — Progress Notes (Signed)
[facility] [place], [place] [address]  Pulmonary Sleep Medicine  Office Visit Note  Patient Name: Jack Jenkins DOB: 21-Jul-1930 MRN [id number]  Date of Service: 06/07/2017  Complaints/HPI: He is doing a little better was in the hospital in [place]. Seen by ID and they agreed to continue with meds for MAI. Patient feels like he is doing better with the drugs since we cut the dosage down. Patient is on oxygen and using it as prescribed. Still with some SOB on exertions. Patient will follow along with ID  ROS  General: (-) fever, (-) chills, (-) night sweats, (-) weakness Skin: (-) rashes, (-) itching,. Eyes: (-) visual changes, (-) redness, (-) itching. Nose and Sinuses: (-) nasal stuffiness or itchiness, (-) postnasal drip, (-) nosebleeds, (-) sinus trouble. Mouth and Throat: (-) sore throat, (-) hoarseness. Neck: (-) swollen glands, (-) enlarged thyroid, (-) neck pain. Respiratory: + cough, (-) bloody sputum, + shortness of breath, + wheezing. Cardiovascular: - ankle swelling, (-) chest pain. Lymphatic: (-) lymph node enlargement. Neurologic: (-) numbness, (-) tingling. Psychiatric: (-) anxiety, (-) depression   Current Medication: Outpatient Encounter Medications as of 06/07/2017  Medication Sig  . allopurinol (ZYLOPRIM) 100 MG tablet Take 1 tablet (100 mg total) by mouth daily.  Marland Kitchen aspirin EC 81 MG EC tablet Take 1 tablet (81 mg total) by mouth daily.  Marland Kitchen azithromycin (ZITHROMAX) 250 MG tablet Take 1 tablet by mouth 3 (three) times a week.  . ethambutol (MYAMBUTOL) 400 MG tablet Take 1,200 mg by mouth 3 (three) times a week.  . furosemide (LASIX) 40 MG tablet Take 1 tablet (40 mg total) by mouth daily as needed for fluid.  . metoprolol succinate (TOPROL XL) 25 MG 24 hr tablet Take 1 tablet (25 mg total) by mouth daily.  . mometasone-formoterol (DULERA) 100-5 MCG/ACT AERO Inhale 2 puffs into the lungs 2 (two) times daily.  . nitroGLYCERIN (NITROSTAT)  0.4 MG SL tablet Place 1 tablet (0.4 mg total) under the tongue every 5 (five) minutes as needed for chest pain.  . rifampin (RIFADIN) 300 MG capsule Take 600 mg by mouth 3 (three) times a week.  . tiotropium (SPIRIVA) 18 MCG inhalation capsule Place 1 capsule (18 mcg total) into inhaler and inhale daily.  . clopidogrel (PLAVIX) 75 MG tablet Take 1 tablet (75 mg total) by mouth daily. (Patient not taking: Reported on 10/09/2016)  . colchicine 0.6 MG tablet Take 1 tablet (0.6 mg total) by mouth daily. (Patient not taking: Reported on 06/07/2017)   Facility-Administered Encounter Medications as of 06/07/2017  Medication  . albuterol (PROVENTIL) (2.5 MG/3ML) 0.083% nebulizer solution 2.5 mg    Surgical History: Past Surgical History:  Procedure Laterality Date  . BACK SURGERY    . CATARACT EXTRACTION Bilateral   . CHOLECYSTECTOMY    . FLEXIBLE BRONCHOSCOPY N/A 02/09/2017   Procedure: FLEXIBLE BRONCHOSCOPY;  Surgeon: Allyne Gee, MD;  Location: [facility] ORS;  Service: Pulmonary;  Laterality: N/A;  . HERNIA REPAIR    . LAMINECTOMY    . POLYPECTOMY     colon  . UPPER GI ENDOSCOPY  01/15/04    Medical History: Past Medical History:  Diagnosis Date  . CHF (congestive heart failure) ([place])   . COPD (chronic obstructive pulmonary disease) ([place])   . Gout   . Renal disorder     Family History: Family History  Problem Relation Age of Onset  . Heart failure Mother   . Heart attack Father   .  CAD Sister   . Lung cancer Brother   . CAD Sister     Social History: Social History   Socioeconomic History  . Marital status: Married    Spouse name: Not on file  . Number of children: Not on file  . Years of education: Not on file  . Highest education level: Not on file  Social Needs  . Financial resource strain: Not on file  . Food insecurity - worry: Not on file  . Food insecurity - inability: Not on file  . Transportation needs - medical: Not on file  . Transportation needs -  non-medical: Not on file  Occupational History  . Not on file  Tobacco Use  . Smoking status: Never Smoker  . Smokeless tobacco: Never Used  Substance and Sexual Activity  . Alcohol use: No    Alcohol/week: 0.0 oz  . Drug use: No  . Sexual activity: No  Other Topics Concern  . Not on file  Social History Narrative  . Not on file    Vital Signs: Blood pressure (!) 107/37, pulse 70, resp. rate 16, height _0  (1.753 m), weight 212 lb 12.8 oz (96.5 kg), SpO2 93 %.  Examination: General Appearance: The patient is well-developed, well-nourished, and in no distress. Skin: Gross inspection of skin unremarkable. Head: normocephalic, no gross deformities. Eyes: no gross deformities noted. ENT: ears appear grossly normal no exudates. Neck: Supple. No thyromegaly. No LAD. Respiratory: Coarse BS few rhonochi noted. Cardiovascular: Normal S1 and S2 without murmur or rub. Extremities: No cyanosis. pulses are equal. Neurologic: Alert and oriented. No involuntary movements.  LABS: Recent Results (from the past 2160 hour(s))  Comprehensive metabolic panel     Status: Abnormal   Collection Time: 04/01/17  7:49 AM  Result Value Ref Range   Sodium 147 (H) 135 - 145 mmol/L   Potassium 4.3 3.5 - 5.1 mmol/L   Chloride 98 (L) 101 - 111 mmol/L   CO2 39 (H) 22 - 32 mmol/L   Glucose, Bld 105 (H) 65 - 99 mg/dL   BUN 16 6 - 20 mg/dL   Creatinine, Ser 1.31 (H) 0.61 - 1.24 mg/dL   Calcium 9.2 8.9 - 10.3 mg/dL   Total Protein 6.6 6.5 - 8.1 g/dL   Albumin 3.6 3.5 - 5.0 g/dL   AST 28 15 - 41 U/L   ALT 14 (L) 17 - 63 U/L   Alkaline Phosphatase 81 38 - 126 U/L   Total Bilirubin 0.8 0.3 - 1.2 mg/dL   GFR calc non Af Amer 48 (L) >60 mL/min   GFR calc Af Amer 56 (L) >60 mL/min    Comment: (NOTE) The eGFR has been calculated using the CKD EPI equation. This calculation has not been validated in all clinical situations. eGFR's persistently <60 mL/min signify possible Chronic Kidney Disease.     Anion gap 10 5 - 15  Brain natriuretic peptide     Status: None   Collection Time: 04/01/17  7:49 AM  Result Value Ref Range   B Natriuretic Peptide 58.0 0.0 - 100.0 pg/mL  Troponin I     Status: None   Collection Time: 04/01/17  7:49 AM  Result Value Ref Range   Troponin I <0.03 <0.03 ng/mL  Lactic acid, plasma     Status: None   Collection Time: 04/01/17  7:49 AM  Result Value Ref Range   Lactic Acid, Venous 1.8 0.5 - 1.9 mmol/L  CBC with Differential     Status:  Abnormal   Collection Time: 04/01/17  7:49 AM  Result Value Ref Range   WBC 3.8 3.8 - 10.6 K/uL   RBC 3.91 (L) 4.40 - 5.90 MIL/uL   Hemoglobin 12.1 (L) 13.0 - 18.0 g/dL   HCT 38.3 (L) 40.0 - 52.0 %   MCV 98.0 80.0 - 100.0 fL   MCH 31.0 26.0 - 34.0 pg   MCHC 31.6 (L) 32.0 - 36.0 g/dL   RDW 15.1 (H) 11.5 - 14.5 %   Platelets 126 (L) 150 - 440 K/uL   Neutrophils Relative % 53 %   Neutro Abs 2.0 1.4 - 6.5 K/uL   Lymphocytes Relative 29 %   Lymphs Abs 1.1 1.0 - 3.6 K/uL   Monocytes Relative 12 %   Monocytes Absolute 0.5 0.2 - 1.0 K/uL   Eosinophils Relative 6 %   Eosinophils Absolute 0.2 0 - 0.7 K/uL   Basophils Relative 0 %   Basophils Absolute 0.0 0 - 0.1 K/uL  Lactic acid, plasma     Status: Abnormal   Collection Time: 04/01/17 12:33 PM  Result Value Ref Range   Lactic Acid, Venous 2.5 (HH) 0.5 - 1.9 mmol/L    Comment: CRITICAL RESULT CALLED TO, READ BACK BY AND VERIFIED WITH AMANDA PEREZ AT 1317 04/01/2017 BY TFK.   TSH     Status: None   Collection Time: 04/01/17 12:33 PM  Result Value Ref Range   TSH 1.804 0.350 - 4.500 uIU/mL    Comment: Performed by a 3rd Generation assay with a functional sensitivity of <=0.01 uIU/mL.  Basic metabolic panel     Status: Abnormal   Collection Time: 04/02/17  4:56 AM  Result Value Ref Range   Sodium 143 135 - 145 mmol/L   Potassium 5.4 (H) 3.5 - 5.1 mmol/L   Chloride 98 (L) 101 - 111 mmol/L   CO2 36 (H) 22 - 32 mmol/L   Glucose, Bld 127 (H) 65 - 99 mg/dL   BUN 22 (H)  6 - 20 mg/dL   Creatinine, Ser 1.43 (H) 0.61 - 1.24 mg/dL   Calcium 9.2 8.9 - 10.3 mg/dL   GFR calc non Af Amer 43 (L) >60 mL/min   GFR calc Af Amer 50 (L) >60 mL/min    Comment: (NOTE) The eGFR has been calculated using the CKD EPI equation. This calculation has not been validated in all clinical situations. eGFR's persistently <60 mL/min signify possible Chronic Kidney Disease.    Anion gap 9 5 - 15  CBC     Status: Abnormal   Collection Time: 04/02/17  4:56 AM  Result Value Ref Range   WBC 6.0 3.8 - 10.6 K/uL   RBC 3.61 (L) 4.40 - 5.90 MIL/uL   Hemoglobin 11.4 (L) 13.0 - 18.0 g/dL   HCT 35.0 (L) 40.0 - 52.0 %   MCV 97.1 80.0 - 100.0 fL   MCH 31.6 26.0 - 34.0 pg   MCHC 32.5 32.0 - 36.0 g/dL   RDW 15.3 (H) 11.5 - 14.5 %   Platelets 128 (L) 150 - 440 K/uL  Glucose, capillary     Status: Abnormal   Collection Time: 04/02/17  7:57 AM  Result Value Ref Range   Glucose-Capillary 105 (H) 65 - 99 mg/dL  Basic metabolic panel     Status: Abnormal   Collection Time: 04/02/17  8:23 AM  Result Value Ref Range   Sodium 145 135 - 145 mmol/L   Potassium 5.4 (H) 3.5 - 5.1 mmol/L   Chloride  97 (L) 101 - 111 mmol/L   CO2 36 (H) 22 - 32 mmol/L   Glucose, Bld 113 (H) 65 - 99 mg/dL   BUN 22 (H) 6 - 20 mg/dL   Creatinine, Ser 1.41 (H) 0.61 - 1.24 mg/dL   Calcium 9.4 8.9 - 10.3 mg/dL   GFR calc non Af Amer 44 (L) >60 mL/min   GFR calc Af Amer 51 (L) >60 mL/min    Comment: (NOTE) The eGFR has been calculated using the CKD EPI equation. This calculation has not been validated in all clinical situations. eGFR's persistently <60 mL/min signify possible Chronic Kidney Disease.    Anion gap 12 5 - 15  Lactic acid, plasma     Status: Abnormal   Collection Time: 04/02/17  8:23 AM  Result Value Ref Range   Lactic Acid, Venous 2.0 (HH) 0.5 - 1.9 mmol/L    Comment: CRITICAL RESULT CALLED TO, READ BACK BY AND VERIFIED WITH MEGAN OAKLEY AT 2297 ON 04/02/17 [place].   Lactic acid, plasma     Status:  Abnormal   Collection Time: 04/02/17 11:10 AM  Result Value Ref Range   Lactic Acid, Venous 3.0 (HH) 0.5 - 1.9 mmol/L    Comment: CRITICAL RESULT CALLED TO, READ BACK BY AND VERIFIED WITH MEGAN OAKLEY AT 1159 ON 04/02/17 [place].   Basic metabolic panel     Status: Abnormal   Collection Time: 04/03/17  4:15 AM  Result Value Ref Range   Sodium 144 135 - 145 mmol/L   Potassium 4.6 3.5 - 5.1 mmol/L   Chloride 98 (L) 101 - 111 mmol/L   CO2 35 (H) 22 - 32 mmol/L   Glucose, Bld 124 (H) 65 - 99 mg/dL   BUN 24 (H) 6 - 20 mg/dL   Creatinine, Ser 1.39 (H) 0.61 - 1.24 mg/dL   Calcium 8.7 (L) 8.9 - 10.3 mg/dL   GFR calc non Af Amer 45 (L) >60 mL/min   GFR calc Af Amer 52 (L) >60 mL/min    Comment: (NOTE) The eGFR has been calculated using the CKD EPI equation. This calculation has not been validated in all clinical situations. eGFR's persistently <60 mL/min signify possible Chronic Kidney Disease.    Anion gap 11 5 - 15  Glucose, capillary     Status: None   Collection Time: 04/03/17  7:25 AM  Result Value Ref Range   Glucose-Capillary 93 65 - 99 mg/dL  Blood gas, venous     Status: Abnormal   Collection Time: 04/03/17  8:43 AM  Result Value Ref Range   FIO2 0.28    pH, Ven 7.33 7.250 - 7.430   pCO2, Ven 83 (HH) 44.0 - 60.0 mmHg    Comment: CRITICAL RESULT CALLED TO, READ BACK BY AND VERIFIED WITH: [facility] WARNER RN ON [phone] AT 0913    pO2, Ven 59.0 (H) 32.0 - 45.0 mmHg   Bicarbonate 43.8 (H) 20.0 - 28.0 mmol/L   Acid-Base Excess 13.1 (H) 0.0 - 2.0 mmol/L   O2 Saturation 88.2 %   Patient temperature 37.0    Collection site VEIN    Sample type VEIN   Lactic acid, plasma     Status: None   Collection Time: 04/03/17  8:43 AM  Result Value Ref Range   Lactic Acid, Venous 1.5 0.5 - 1.9 mmol/L  MRSA PCR Screening     Status: None   Collection Time: 04/03/17 10:23 AM  Result Value Ref Range   MRSA by PCR NEGATIVE NEGATIVE  Comment:        The GeneXpert MRSA Assay (FDA approved  for NASAL specimens only), is one component of a comprehensive MRSA colonization surveillance program. It is not intended to diagnose MRSA infection nor to guide or monitor treatment for MRSA infections.   Glucose, capillary     Status: Abnormal   Collection Time: 04/03/17 10:25 AM  Result Value Ref Range   Glucose-Capillary 131 (H) 65 - 99 mg/dL  Glucose, capillary     Status: None   Collection Time: 04/04/17  7:13 AM  Result Value Ref Range   Glucose-Capillary 88 65 - 99 mg/dL  Blood gas, arterial     Status: Abnormal   Collection Time: 04/04/17  9:54 AM  Result Value Ref Range   FIO2 0.32    pH, Arterial 7.44 7.350 - 7.450   pCO2 arterial 61 (H) 32.0 - 48.0 mmHg   pO2, Arterial 56 (L) 83.0 - 108.0 mmHg   Bicarbonate 41.4 (H) 20.0 - 28.0 mmol/L   Acid-Base Excess 14.8 (H) 0.0 - 2.0 mmol/L   O2 Saturation 89.8 %   Patient temperature 37.0    Collection site RIGHT RADIAL    Sample type ARTERIAL DRAW    Allens test (pass/fail) PASS PASS  Basic metabolic panel     Status: Abnormal   Collection Time: 04/05/17  4:17 AM  Result Value Ref Range   Sodium 143 135 - 145 mmol/L   Potassium 4.1 3.5 - 5.1 mmol/L   Chloride 105 101 - 111 mmol/L   CO2 35 (H) 22 - 32 mmol/L   Glucose, Bld 99 65 - 99 mg/dL   BUN 22 (H) 6 - 20 mg/dL   Creatinine, Ser 1.24 0.61 - 1.24 mg/dL   Calcium 8.3 (L) 8.9 - 10.3 mg/dL   GFR calc non Af Amer 52 (L) >60 mL/min   GFR calc Af Amer 60 (L) >60 mL/min    Comment: (NOTE) The eGFR has been calculated using the CKD EPI equation. This calculation has not been validated in all clinical situations. eGFR's persistently <60 mL/min signify possible Chronic Kidney Disease.    Anion gap 3 (L) 5 - 15  Lactic acid, plasma     Status: None   Collection Time: 04/05/17  4:17 AM  Result Value Ref Range   Lactic Acid, Venous 0.6 0.5 - 1.9 mmol/L  Comprehensive metabolic panel     Status: Abnormal   Collection Time: 04/05/17  5:04 AM  Result Value Ref Range    Sodium 143 135 - 145 mmol/L   Potassium 4.2 3.5 - 5.1 mmol/L   Chloride 105 101 - 111 mmol/L   CO2 35 (H) 22 - 32 mmol/L   Glucose, Bld 93 65 - 99 mg/dL   BUN 24 (H) 6 - 20 mg/dL   Creatinine, Ser 1.31 (H) 0.61 - 1.24 mg/dL   Calcium 8.2 (L) 8.9 - 10.3 mg/dL   Total Protein 4.9 (L) 6.5 - 8.1 g/dL   Albumin 2.6 (L) 3.5 - 5.0 g/dL   AST 36 15 - 41 U/L   ALT 21 17 - 63 U/L   Alkaline Phosphatase 55 38 - 126 U/L   Total Bilirubin 0.5 0.3 - 1.2 mg/dL   GFR calc non Af Amer 48 (L) >60 mL/min   GFR calc Af Amer 56 (L) >60 mL/min    Comment: (NOTE) The eGFR has been calculated using the CKD EPI equation. This calculation has not been validated in all clinical situations. eGFR's persistently <  60 mL/min signify possible Chronic Kidney Disease.    Anion gap 3 (L) 5 - 15  Magnesium     Status: Abnormal   Collection Time: 04/05/17  5:04 AM  Result Value Ref Range   Magnesium 2.5 (H) 1.7 - 2.4 mg/dL  Phosphorus     Status: None   Collection Time: 04/05/17  5:04 AM  Result Value Ref Range   Phosphorus 4.5 2.5 - 4.6 mg/dL  CBC with Differential/Platelet     Status: Abnormal   Collection Time: 04/05/17  5:04 AM  Result Value Ref Range   WBC 2.9 (L) 3.8 - 10.6 K/uL   RBC 3.14 (L) 4.40 - 5.90 MIL/uL   Hemoglobin 9.9 (L) 13.0 - 18.0 g/dL   HCT 30.6 (L) 40.0 - 52.0 %   MCV 97.5 80.0 - 100.0 fL   MCH 31.4 26.0 - 34.0 pg   MCHC 32.2 32.0 - 36.0 g/dL   RDW 15.9 (H) 11.5 - 14.5 %   Platelets 89 (L) 150 - 440 K/uL   Neutrophils Relative % 63 %   Neutro Abs 1.8 1.4 - 6.5 K/uL   Lymphocytes Relative 17 %   Lymphs Abs 0.5 (L) 1.0 - 3.6 K/uL   Monocytes Relative 16 %   Monocytes Absolute 0.5 0.2 - 1.0 K/uL   Eosinophils Relative 4 %   Eosinophils Absolute 0.1 0 - 0.7 K/uL   Basophils Relative 0 %   Basophils Absolute 0.0 0 - 0.1 K/uL  Ammonia     Status: Abnormal   Collection Time: 04/05/17  5:04 AM  Result Value Ref Range   Ammonia 38 (H) 9 - 35 umol/L  Glucose, capillary     Status: None    Collection Time: 04/05/17  7:21 AM  Result Value Ref Range   Glucose-Capillary 72 65 - 99 mg/dL  Comprehensive metabolic panel     Status: Abnormal   Collection Time: 04/06/17  5:50 AM  Result Value Ref Range   Sodium 143 135 - 145 mmol/L   Potassium 4.8 3.5 - 5.1 mmol/L   Chloride 102 101 - 111 mmol/L   CO2 33 (H) 22 - 32 mmol/L   Glucose, Bld 96 65 - 99 mg/dL   BUN 22 (H) 6 - 20 mg/dL   Creatinine, Ser 1.32 (H) 0.61 - 1.24 mg/dL   Calcium 8.7 (L) 8.9 - 10.3 mg/dL   Total Protein 5.8 (L) 6.5 - 8.1 g/dL   Albumin 2.9 (L) 3.5 - 5.0 g/dL   AST 54 (H) 15 - 41 U/L   ALT 37 17 - 63 U/L   Alkaline Phosphatase 68 38 - 126 U/L   Total Bilirubin 1.1 0.3 - 1.2 mg/dL   GFR calc non Af Amer 48 (L) >60 mL/min   GFR calc Af Amer 55 (L) >60 mL/min    Comment: (NOTE) The eGFR has been calculated using the CKD EPI equation. This calculation has not been validated in all clinical situations. eGFR's persistently <60 mL/min signify possible Chronic Kidney Disease.    Anion gap 8 5 - 15  CBC with Differential/Platelet     Status: Abnormal   Collection Time: 04/06/17  5:50 AM  Result Value Ref Range   WBC 5.8 3.8 - 10.6 K/uL   RBC 3.63 (L) 4.40 - 5.90 MIL/uL   Hemoglobin 11.4 (L) 13.0 - 18.0 g/dL   HCT 35.1 (L) 40.0 - 52.0 %   MCV 96.8 80.0 - 100.0 fL   MCH 31.3 26.0 - 34.0 pg  MCHC 32.3 32.0 - 36.0 g/dL   RDW 15.5 (H) 11.5 - 14.5 %   Platelets 109 (L) 150 - 440 K/uL   Neutrophils Relative % 70 %   Neutro Abs 4.1 1.4 - 6.5 K/uL   Lymphocytes Relative 10 %   Lymphs Abs 0.6 (L) 1.0 - 3.6 K/uL   Monocytes Relative 18 %   Monocytes Absolute 1.0 0.2 - 1.0 K/uL   Eosinophils Relative 2 %   Eosinophils Absolute 0.1 0 - 0.7 K/uL   Basophils Relative 0 %   Basophils Absolute 0.0 0 - 0.1 K/uL  Glucose, capillary     Status: None   Collection Time: 04/06/17  7:39 AM  Result Value Ref Range   Glucose-Capillary 94 65 - 99 mg/dL  Uric acid     Status: None   Collection Time: 04/06/17  9:24 AM   Result Value Ref Range   Uric Acid, Serum 6.5 4.4 - 7.6 mg/dL  Sedimentation rate     Status: Abnormal   Collection Time: 04/06/17  9:24 AM  Result Value Ref Range   Sed Rate 45 (H) 0 - 20 mm/hr  C-reactive protein     Status: Abnormal   Collection Time: 04/06/17  9:24 AM  Result Value Ref Range   CRP 6.3 (H) <1.0 mg/dL    Comment: Performed at [facility], [address], [place], [place] [address]  Synovial cell count + diff, w/ crystals     Status: Abnormal   Collection Time: 04/06/17 10:45 AM  Result Value Ref Range   Color, Synovial YELLOW YELLOW   Appearance-Synovial CLOUDY (A) CLEAR   Crystals, Fluid INTRACELLULAR MONOSODIUM URATE CRYSTALS    WBC, Synovial 22,081 (H) 0 - 200 /cu mm   Neutrophil, Synovial 88 %   Lymphocytes-Synovial Fld 0 %   Monocyte-Macrophage-Synovial Fluid 12 %   Eosinophils-Synovial 0 %  Body fluid culture     Status: None   Collection Time: 04/06/17 10:45 AM  Result Value Ref Range   Specimen Description KNEE    Special Requests LEFT    Gram Stain      FEW WBC PRESENT, PREDOMINANTLY PMN NO ORGANISMS SEEN    Culture      NO GROWTH 3 DAYS Performed at [facility], [place] [address], [place], [place] [address]    Report Status 04/09/2017 FINAL   Comprehensive metabolic panel     Status: Abnormal   Collection Time: 04/07/17  4:04 AM  Result Value Ref Range   Sodium 143 135 - 145 mmol/L   Potassium 5.1 3.5 - 5.1 mmol/L   Chloride 102 101 - 111 mmol/L   CO2 34 (H) 22 - 32 mmol/L   Glucose, Bld 129 (H) 65 - 99 mg/dL   BUN 24 (H) 6 - 20 mg/dL   Creatinine, Ser 1.40 (H) 0.61 - 1.24 mg/dL   Calcium 8.6 (L) 8.9 - 10.3 mg/dL   Total Protein 5.6 (L) 6.5 - 8.1 g/dL   Albumin 2.6 (L) 3.5 - 5.0 g/dL   AST 34 15 - 41 U/L   ALT 31 17 - 63 U/L   Alkaline Phosphatase 71 38 - 126 U/L   Total Bilirubin 0.6 0.3 - 1.2 mg/dL   GFR calc non Af Amer 45 (L) >60 mL/min   GFR calc Af Amer 52 (L) >60 mL/min    Comment: (NOTE) The eGFR has been  calculated using the CKD EPI equation. This calculation has not been validated in all  clinical situations. eGFR's persistently <60 mL/min signify possible Chronic Kidney Disease.    Anion gap 7 5 - 15  CBC with Differential/Platelet     Status: Abnormal   Collection Time: 04/07/17  4:04 AM  Result Value Ref Range   WBC 6.7 3.8 - 10.6 K/uL   RBC 3.39 (L) 4.40 - 5.90 MIL/uL   Hemoglobin 10.6 (L) 13.0 - 18.0 g/dL   HCT 32.8 (L) 40.0 - 52.0 %   MCV 96.9 80.0 - 100.0 fL   MCH 31.3 26.0 - 34.0 pg   MCHC 32.3 32.0 - 36.0 g/dL   RDW 15.7 (H) 11.5 - 14.5 %   Platelets 102 (L) 150 - 440 K/uL   Neutrophils Relative % 86 %   Neutro Abs 5.8 1.4 - 6.5 K/uL   Lymphocytes Relative 7 %   Lymphs Abs 0.5 (L) 1.0 - 3.6 K/uL   Monocytes Relative 7 %   Monocytes Absolute 0.5 0.2 - 1.0 K/uL   Eosinophils Relative 0 %   Eosinophils Absolute 0.0 0 - 0.7 K/uL   Basophils Relative 0 %   Basophils Absolute 0.0 0 - 0.1 K/uL  Glucose, capillary     Status: Abnormal   Collection Time: 04/07/17  7:41 AM  Result Value Ref Range   Glucose-Capillary 109 (H) 65 - 99 mg/dL  CBC with Differential/Platelet     Status: Abnormal   Collection Time: 04/13/17  9:21 AM  Result Value Ref Range   WBC 7.1 3.8 - 10.8 Thousand/uL   RBC 3.53 (L) 4.20 - 5.80 Million/uL   Hemoglobin 10.8 (L) 13.2 - 17.1 g/dL   HCT 33.7 (L) 38.5 - 50.0 %   MCV 95.5 80.0 - 100.0 fL   MCH 30.6 27.0 - 33.0 pg   MCHC 32.0 32.0 - 36.0 g/dL   RDW 12.6 11.0 - 15.0 %   Platelets 210 140 - 400 Thousand/uL   MPV 10.7 7.5 - 12.5 fL   Neutro Abs 5,552 1,500 - 7,800 cells/uL   Lymphs Abs 646 (L) 850 - 3,900 cells/uL   WBC mixed population 547 200 - 950 cells/uL   Eosinophils Absolute 348 15 - 500 cells/uL   Basophils Absolute 7 0 - 200 cells/uL   Neutrophils Relative % 78.2 %   Total Lymphocyte 9.1 %   Monocytes Relative 7.7 %   Eosinophils Relative 4.9 %   Basophils Relative 0.1 %  Uric acid     Status: None   Collection Time: 04/13/17  9:21  AM  Result Value Ref Range   Uric Acid, Serum 7.1 4.0 - 8.0 mg/dL    Comment: Therapeutic target for gout patients: <6.0 mg/dL .   COMPLETE METABOLIC PANEL WITH GFR     Status: Abnormal   Collection Time: 04/13/17  9:21 AM  Result Value Ref Range   Glucose, Bld 166 (H) 65 - 99 mg/dL    Comment: .            Fasting reference interval . For someone without known diabetes, a glucose value >125 mg/dL indicates that they may have diabetes and this should be confirmed with a follow-up test. .    BUN 24 7 - 25 mg/dL   Creat 1.42 (H) 0.70 - 1.11 mg/dL    Comment: For patients >31 years of age, the reference limit for Creatinine is approximately 13% higher for people identified as African-American. .    GFR, Est Non African American 45 (L) > OR = 60 mL/min/1.40m   GFR,  Est African American 52 (L) > OR = 60 mL/min/1.50m   BUN/Creatinine Ratio 17 6 - 22 (calc)   Sodium 146 135 - 146 mmol/L   Potassium 4.6 3.5 - 5.3 mmol/L   Chloride 96 (L) 98 - 110 mmol/L   CO2 43 (H) 20 - 32 mmol/L    Comment: Verified by repeat analysis. .    Calcium 9.1 8.6 - 10.3 mg/dL   Total Protein 5.7 (L) 6.1 - 8.1 g/dL   Albumin 3.0 (L) 3.6 - 5.1 g/dL   Globulin 2.7 1.9 - 3.7 g/dL (calc)   AG Ratio 1.1 1.0 - 2.5 (calc)   Total Bilirubin 0.5 0.2 - 1.2 mg/dL   Alkaline phosphatase (APISO) 90 40 - 115 U/L   AST 19 10 - 35 U/L   ALT 19 9 - 46 U/L    Radiology: Dg Chest 1 View  Result Date: 04/02/2017 CLINICAL DATA:  Shortness of breath since yesterday. EXAM: CHEST 1 VIEW COMPARISON:  04/01/2017 and CT 04/01/2017 FINDINGS: Lungs are hypoinflated with linear atelectasis/scarring over the left base. The small left effusion with basilar atelectasis seen on the recent CT is not well visualized on today's radiograph mild stable cardiomegaly. Remainder the exam is unchanged. IMPRESSION: Hyperinflation without acute cardiopulmonary disease. Small left effusions/atelectasis seen on CT yesterday is not well  visualized. Mild stable cardiomegaly. Electronically Signed   By: DMarin OlpM.D.   On: 04/02/2017 09:28   Ct Head Wo Contrast  Result Date: 04/01/2017 CLINICAL DATA:  Mental status changes. EXAM: CT HEAD WITHOUT CONTRAST TECHNIQUE: Contiguous axial images were obtained from the base of the skull through the vertex without intravenous contrast. COMPARISON:  None. FINDINGS: Brain: The ventricles are normal in size and configuration. No extra-axial fluid collections are identified. The gray-white differentiation is normal. No CT findings for acute intracranial process such as hemorrhage or infarction. 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 No mass lesions. The brainstem and cerebellum are grossly normal. Vascular: Vascular calcifications but no definite aneurysm or hyperdense vessels. Skull: No skull fracture or bone lesion. Sinuses/Orbits: The paranasal sinuses and mastoid air cells are clear. The globes are intact. Other: No scalp lesions or hematoma. IMPRESSION: No acute intracranial findings or mass lesion. Electronically Signed   By: PMarijo SanesM.D.   On: 04/01/2017 10:57   Ct Angio Chest Pe W Or Wo Contrast  Result Date: 04/01/2017 CLINICAL DATA:  Shortness of breath.  Leg swelling. EXAM: CT ANGIOGRAPHY CHEST WITH CONTRAST TECHNIQUE: Multidetector CT imaging of the chest was performed using the standard protocol during bolus administration of intravenous contrast. Multiplanar CT image reconstructions and MIPs were obtained to evaluate the vascular anatomy. CONTRAST:  765mISOVUE-370 IOPAMIDOL (ISOVUE-370) INJECTION 76% COMPARISON:  Plain films of earlier today.  CT of 01/25/2017. FINDINGS: Cardiovascular: The quality of this exam for evaluation of pulmonary embolism is moderate. The bolus is centered in the SVC. No pulmonary embolism to the large segmental level Aortic atherosclerosis. Tortuous thoracic aorta. Mild cardiomegaly with lipomatous  hypertrophy of the interatrial septum. No pericardial effusion. Mediastinum/Nodes: No mediastinal or hilar adenopathy. Lungs/Pleura: New small left pleural effusion. Left hemidiaphragm elevation with adjacent volume loss and passive atelectasis. Calcified granulomas at the right lung base. Mucoid impaction within right lower lobe subsegmental bronchi, including on image 66/ series 6. Similar. Re- demonstration of volume loss in the right middle lobe. Minimal left upper lobe clustered "Tree-in-bud" nodularity, including on image 26/ series 6, similar. Similar subtle  findings identified in the right upper and right lower lobes. Upper Abdomen: Normal imaged portions of the liver, spleen, stomach, pancreas. Cholecystectomy. Normal imaged adrenal glands and right kidney. Posterior upper pole left renal cyst. An anterolateral left upper pole 2.9 cm lesion measures greater than fluid density, including on image 93/series 4. Is similar in size back on 05/15/2013 Musculoskeletal: Moderate to marked bilateral gynecomastia. Probable sebaceous cyst about the anterior right chest wall at 10 mm. Osteopenia. Review of the MIP images confirms the above findings. IMPRESSION: 1. No evidence of pulmonary embolism. Mild limitations as detailed above. 2. New small left pleural effusion. Given the clinical history, this could represent fluid overload. 3. Similar appearance of the lungs, with left hemidiaphragm elevation and adjacent passive atelectasis. Subtle "Tree-in-bud" nodularity again identified, suggestive of infectious bronchiolitis. 4.  Aortic Atherosclerosis (ICD10-I70.0). 5. Left renal lesion which is technically indeterminate but favored to represent a minimally complex cyst, given size stability relative to 05/16/2003. Electronically Signed   By: Abigail Miyamoto M.D.   On: 04/01/2017 14:41   US Venous Img Lower Unilateral Right  Result Date: 04/01/2017 CLINICAL DATA:  Right leg swelling for 2 years. Short of breath.  Worsening over the last 3 weeks. EXAM: RIGHT LOWER EXTREMITY VENOUS DUPLEX ULTRASOUND TECHNIQUE: Doppler venous assessment of the right lower extremity deep venous system was performed, including characterization of spectral flow, compressibility, and phasicity. COMPARISON:  None. FINDINGS: There is complete compressibility of the right common femoral, femoral, and popliteal veins. Doppler analysis demonstrates respiratory phasicity and augmentation of flow with calf compression. No obvious superficial vein or calf vein thrombosis. IMPRESSION: No evidence of right lower extremity DVT. Electronically Signed   By: Marybelle Killings M.D.   On: 04/01/2017 08:39   Dg Chest Portable 1 View  Result Date: 04/01/2017 CLINICAL DATA:  Short of breath EXAM: PORTABLE CHEST 1 VIEW COMPARISON:  02/09/2017 FINDINGS: Bibasilar airspace disease left greater than right unchanged. Negative for heart failure or effusion. 1 cm nodular density overlying the left fourth rib posteriorly compatible with a bone island in the rib. IMPRESSION: Bibasilar atelectasis unchanged. Electronically Signed   By: Franchot Gallo M.D.   On: 04/01/2017 08:12    No results found.  No results found.    Assessment and Plan: Patient Active Problem List   Diagnosis Date Noted  . Dependence on supplemental oxygen 06/07/2017  . Interstitial pulmonary disease ([place]) 06/07/2017  . Mycobacterial infection 06/07/2017  . Chronic respiratory failure with hypoxia ([place]) 06/07/2017  . Pulmonary fibrosis ([place]) 06/07/2017  . 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 Sleep apnea 06/07/2017  . Bronchiolectasis ([place]) 06/07/2017  . COPD (chronic obstructive pulmonary disease) ([place]) 04/01/2017  . Benign essential HTN 11/24/2016  . Chronic diastolic CHF (congestive heart failure), NYHA class 3 ([place]) 11/24/2016  . Hyperlipidemia, mixed 10/15/2016  . Chest pain 10/07/2016  . Acute respiratory failure ([place]) 04/21/2015  . Dyspnea 04/21/2015  . COPD  exacerbation ([place]) 04/21/2015  . Abnormal chest CT 04/21/2015  . CKD (chronic kidney disease) 12/17/2014  . Photokeratitis 11/06/2014  . Allergic rhinitis 11/06/2014  . AB (asthmatic bronchitis) 11/06/2014  . CCF (congestive cardiac failure) ([place]) 11/06/2014  . Colon polyp 11/06/2014  . Urinary system disease 11/06/2014  . ED (erectile dysfunction) of organic origin 11/06/2014  . Acid reflux 11/06/2014  . Acute gouty arthropathy 11/06/2014  . Gout 11/06/2014  . Arthritis, degenerative 11/06/2014  . Fast heart beat 11/06/2014    1. COPD severe disease with oxygen dependence. He  continues to use his inhalers as prescribed. Denies any side effects reltated to the inhalers 2. MAI Patient will continue with his antibiotics 3. CHF diastolic last EF in the 67% range will monitor 4. Interstitial Fibrosis will follow xrays right now is stable 5. Chronic respiratory failure with hypoxia on oxygen therapy  General Counseling: I have discussed the findings of the evaluation and examination with Jack Jenkins.  I have also discussed any further diagnostic evaluation thatmay be needed or ordered today. Jack Jenkins verbalizes understanding of the findings of todays visit. We also reviewed his medications today and discussed drug interactions and side effects including but not limited excessive drowsiness and altered mental states. We also discussed that there is always a risk not just to him but also people around him. he has been encouraged to call the office with any questions or concerns that should arise related to todays visit.    Time spent: 22mn  I have personally obtained a history, examined the patient, evaluated laboratory and imaging results, formulated the assessment and plan and placed orders.    SAllyne Gee MD [facility]

## 2017-06-07 NOTE — Patient Instructions (Signed)

## 2017-06-08 LAB — COMPREHENSIVE METABOLIC PANEL
A/G RATIO: 1.5 (ref 1.2–2.2)
ALK PHOS: 87 IU/L (ref 39–117)
ALT: 14 IU/L (ref 0–44)
AST: 23 IU/L (ref 0–40)
Albumin: 3.6 g/dL (ref 3.5–4.7)
BUN / CREAT RATIO: 12 (ref 10–24)
BUN: 17 mg/dL (ref 8–27)
Bilirubin Total: 0.2 mg/dL (ref 0.0–1.2)
CO2: 38 mmol/L — AB (ref 20–29)
CREATININE: 1.47 mg/dL — AB (ref 0.76–1.27)
Calcium: 9.3 mg/dL (ref 8.6–10.2)
Chloride: 99 mmol/L (ref 96–106)
GFR calc Af Amer: 50 mL/min/{1.73_m2} — ABNORMAL LOW (ref >59)
GFR, EST NON AFRICAN AMERICAN: 43 mL/min/{1.73_m2} — AB (ref >59)
GLOBULIN, TOTAL: 2.4 g/dL (ref 1.5–4.5)
Glucose: 92 mg/dL (ref 65–99)
Potassium: 4.4 mmol/L (ref 3.5–5.2)
SODIUM: 150 mmol/L — AB (ref 134–144)
Total Protein: 6 g/dL (ref 6.0–8.5)

## 2017-06-08 LAB — CBC WITH DIFFERENTIAL/PLATELET
BASOS: 0 %
Basophils Absolute: 0 10*3/uL (ref 0.0–0.2)
EOS (ABSOLUTE): 0.2 10*3/uL (ref 0.0–0.4)
EOS: 5 %
HEMATOCRIT: 37.3 % — AB (ref 37.5–51.0)
Hemoglobin: 11.4 g/dL — ABNORMAL LOW (ref 13.0–17.7)
Immature Grans (Abs): 0 10*3/uL (ref 0.0–0.1)
Immature Granulocytes: 0 %
LYMPHS ABS: 1.3 10*3/uL (ref 0.7–3.1)
Lymphs: 24 %
MCH: 30.2 pg (ref 26.6–33.0)
MCHC: 30.6 g/dL — AB (ref 31.5–35.7)
MCV: 99 fL — AB (ref 79–97)
MONOCYTES: 11 %
MONOS ABS: 0.6 10*3/uL (ref 0.1–0.9)
NEUTROS ABS: 3.1 10*3/uL (ref 1.4–7.0)
Neutrophils: 60 %
Platelets: 147 10*3/uL — ABNORMAL LOW (ref 150–379)
RBC: 3.77 x10E6/uL — AB (ref 4.14–5.80)
RDW: 14 % (ref 12.3–15.4)
WBC: 5.3 10*3/uL (ref 3.4–10.8)

## 2017-06-13 ENCOUNTER — Inpatient Hospital Stay
Admission: EM | Admit: 2017-06-13 | Discharge: 2017-06-16 | DRG: 683 | Disposition: A | Discharge status: Skilled Nursing Facility | Payer: Medicare Other | Attending: Internal Medicine | Admitting: Internal Medicine

## 2017-06-13 ENCOUNTER — Emergency Department: Payer: Medicare Other

## 2017-06-13 DIAGNOSIS — S0990XA Unspecified injury of head, initial encounter: Secondary | ICD-10-CM

## 2017-06-13 DIAGNOSIS — N183 Chronic kidney disease, stage 3 (moderate): Secondary | ICD-10-CM | POA: Diagnosis present

## 2017-06-13 DIAGNOSIS — Z9981 Dependence on supplemental oxygen: Secondary | ICD-10-CM

## 2017-06-13 DIAGNOSIS — A310 Pulmonary mycobacterial infection: Secondary | ICD-10-CM | POA: Diagnosis present

## 2017-06-13 DIAGNOSIS — Y92009 Unspecified place in unspecified non-institutional (private) residence as the place of occurrence of the external cause: Secondary | ICD-10-CM

## 2017-06-13 DIAGNOSIS — S0101XA Laceration without foreign body of scalp, initial encounter: Secondary | ICD-10-CM | POA: Diagnosis present

## 2017-06-13 DIAGNOSIS — I130 Hypertensive heart and chronic kidney disease with heart failure and stage 1 through stage 4 chronic kidney disease, or unspecified chronic kidney disease: Secondary | ICD-10-CM | POA: Diagnosis present

## 2017-06-13 DIAGNOSIS — E870 Hyperosmolality and hypernatremia: Secondary | ICD-10-CM | POA: Diagnosis present

## 2017-06-13 DIAGNOSIS — I429 Cardiomyopathy, unspecified: Secondary | ICD-10-CM | POA: Diagnosis present

## 2017-06-13 DIAGNOSIS — M25551 Pain in right hip: Secondary | ICD-10-CM | POA: Diagnosis present

## 2017-06-13 DIAGNOSIS — Z79899 Other long term (current) drug therapy: Secondary | ICD-10-CM

## 2017-06-13 DIAGNOSIS — Z792 Long term (current) use of antibiotics: Secondary | ICD-10-CM

## 2017-06-13 DIAGNOSIS — J8410 Pulmonary fibrosis, unspecified: Secondary | ICD-10-CM | POA: Diagnosis present

## 2017-06-13 DIAGNOSIS — J4541 Moderate persistent asthma with (acute) exacerbation: Secondary | ICD-10-CM

## 2017-06-13 DIAGNOSIS — I5022 Chronic systolic (congestive) heart failure: Secondary | ICD-10-CM | POA: Diagnosis present

## 2017-06-13 DIAGNOSIS — S42291A Other displaced fracture of upper end of right humerus, initial encounter for closed fracture: Secondary | ICD-10-CM | POA: Diagnosis present

## 2017-06-13 DIAGNOSIS — S42309A Unspecified fracture of shaft of humerus, unspecified arm, initial encounter for closed fracture: Secondary | ICD-10-CM | POA: Diagnosis present

## 2017-06-13 DIAGNOSIS — W19XXXA Unspecified fall, initial encounter: Secondary | ICD-10-CM

## 2017-06-13 DIAGNOSIS — J439 Emphysema, unspecified: Secondary | ICD-10-CM | POA: Diagnosis present

## 2017-06-13 DIAGNOSIS — N179 Acute kidney failure, unspecified: Principal | ICD-10-CM | POA: Diagnosis present

## 2017-06-13 DIAGNOSIS — W01190A Fall on same level from slipping, tripping and stumbling with subsequent striking against furniture, initial encounter: Secondary | ICD-10-CM | POA: Diagnosis present

## 2017-06-13 DIAGNOSIS — R062 Wheezing: Secondary | ICD-10-CM

## 2017-06-13 MED ORDER — LIDOCAINE-EPINEPHRINE-TETRACAINE (LET) SOLUTION
3.0000 mL | Freq: Once | NASAL | Status: AC
  Administered 2017-06-14: 3 mL via TOPICAL
  Filled 2017-06-13: qty 3

## 2017-06-13 MED ORDER — ONDANSETRON HCL 4 MG/2ML IJ SOLN
4.0000 mg | Freq: Once | INTRAMUSCULAR | Status: DC
  Filled 2017-06-13: qty 2

## 2017-06-13 MED ORDER — MORPHINE SULFATE (PF) 4 MG/ML IV SOLN
4.0000 mg | Freq: Once | INTRAVENOUS | Status: DC
  Filled 2017-06-13: qty 1

## 2017-06-13 NOTE — ED Notes (Signed)
ED Provider at bedside. 

## 2017-06-13 NOTE — ED Triage Notes (Signed)
[facility]  Pt states he was going to bed and it was dark in bedroom.  Pt states he went to grab bed and fell.  Pt denies LOC.  Pt is on chronic 2-4LNC.  Pt has lack to R eye, R shoulder pain and R elbow skin tear.  Pt is A&Ox4, in NAD.  Tender to touch on R shoulder/hand.

## 2017-06-13 NOTE — ED Notes (Signed)
Patient reports getting ready for bed in dark room and became over balanced and fell, striking dresser.  Patient denies loss of consciousness.  Patient with laceration to right scalp, reports right shoulder pain.   Patient is alert and oriented x 4.

## 2017-06-14 ENCOUNTER — Emergency Department: Payer: Medicare Other

## 2017-06-14 ENCOUNTER — Other Ambulatory Visit: Payer: Self-pay

## 2017-06-14 ENCOUNTER — Inpatient Hospital Stay: Payer: Medicare Other

## 2017-06-14 ENCOUNTER — Encounter: Payer: Self-pay | Admitting: Internal Medicine

## 2017-06-14 DIAGNOSIS — Y92009 Unspecified place in unspecified non-institutional (private) residence as the place of occurrence of the external cause: Secondary | ICD-10-CM | POA: Diagnosis not present

## 2017-06-14 DIAGNOSIS — S42291A Other displaced fracture of upper end of right humerus, initial encounter for closed fracture: Secondary | ICD-10-CM | POA: Diagnosis present

## 2017-06-14 DIAGNOSIS — E870 Hyperosmolality and hypernatremia: Secondary | ICD-10-CM | POA: Diagnosis present

## 2017-06-14 DIAGNOSIS — I5022 Chronic systolic (congestive) heart failure: Secondary | ICD-10-CM | POA: Diagnosis present

## 2017-06-14 DIAGNOSIS — J8410 Pulmonary fibrosis, unspecified: Secondary | ICD-10-CM | POA: Diagnosis present

## 2017-06-14 DIAGNOSIS — Z792 Long term (current) use of antibiotics: Secondary | ICD-10-CM | POA: Diagnosis not present

## 2017-06-14 DIAGNOSIS — A310 Pulmonary mycobacterial infection: Secondary | ICD-10-CM | POA: Diagnosis present

## 2017-06-14 DIAGNOSIS — W01190A Fall on same level from slipping, tripping and stumbling with subsequent striking against furniture, initial encounter: Secondary | ICD-10-CM | POA: Diagnosis present

## 2017-06-14 DIAGNOSIS — N183 Chronic kidney disease, stage 3 (moderate): Secondary | ICD-10-CM | POA: Diagnosis present

## 2017-06-14 DIAGNOSIS — M25551 Pain in right hip: Secondary | ICD-10-CM | POA: Diagnosis present

## 2017-06-14 DIAGNOSIS — J439 Emphysema, unspecified: Secondary | ICD-10-CM | POA: Diagnosis present

## 2017-06-14 DIAGNOSIS — S0101XA Laceration without foreign body of scalp, initial encounter: Secondary | ICD-10-CM | POA: Diagnosis present

## 2017-06-14 DIAGNOSIS — S42309A Unspecified fracture of shaft of humerus, unspecified arm, initial encounter for closed fracture: Secondary | ICD-10-CM | POA: Diagnosis present

## 2017-06-14 DIAGNOSIS — I130 Hypertensive heart and chronic kidney disease with heart failure and stage 1 through stage 4 chronic kidney disease, or unspecified chronic kidney disease: Secondary | ICD-10-CM | POA: Diagnosis present

## 2017-06-14 DIAGNOSIS — I429 Cardiomyopathy, unspecified: Secondary | ICD-10-CM | POA: Diagnosis present

## 2017-06-14 DIAGNOSIS — Z79899 Other long term (current) drug therapy: Secondary | ICD-10-CM | POA: Diagnosis not present

## 2017-06-14 DIAGNOSIS — N179 Acute kidney failure, unspecified: Secondary | ICD-10-CM | POA: Diagnosis present

## 2017-06-14 DIAGNOSIS — Z9981 Dependence on supplemental oxygen: Secondary | ICD-10-CM | POA: Diagnosis not present

## 2017-06-14 LAB — BASIC METABOLIC PANEL
Anion gap: 11 (ref 5–15)
Anion gap: 7 (ref 5–15)
BUN: 23 mg/dL — AB (ref 6–20)
BUN: 25 mg/dL — ABNORMAL HIGH (ref 6–20)
CHLORIDE: 100 mmol/L — AB (ref 101–111)
CHLORIDE: 99 mmol/L — AB (ref 101–111)
CO2: 40 mmol/L — ABNORMAL HIGH (ref 22–32)
CO2: 42 mmol/L — ABNORMAL HIGH (ref 22–32)
CREATININE: 1.51 mg/dL — AB (ref 0.61–1.24)
CREATININE: 1.65 mg/dL — AB (ref 0.61–1.24)
Calcium: 8.8 mg/dL — ABNORMAL LOW (ref 8.9–10.3)
Calcium: 8.8 mg/dL — ABNORMAL LOW (ref 8.9–10.3)
GFR calc Af Amer: 47 mL/min — ABNORMAL LOW (ref >60)
GFR calc non Af Amer: 36 mL/min — ABNORMAL LOW (ref >60)
GFR calc non Af Amer: 40 mL/min — ABNORMAL LOW (ref >60)
GFR, EST AFRICAN AMERICAN: 42 mL/min — AB (ref >60)
GLUCOSE: 116 mg/dL — AB (ref 65–99)
Glucose, Bld: 154 mg/dL — ABNORMAL HIGH (ref 65–99)
Potassium: 4.4 mmol/L (ref 3.5–5.1)
Potassium: 4.5 mmol/L (ref 3.5–5.1)
SODIUM: 151 mmol/L — AB (ref 135–145)
Sodium: 148 mmol/L — ABNORMAL HIGH (ref 135–145)

## 2017-06-14 LAB — CBC
HEMATOCRIT: 33.6 % — AB (ref 40.0–52.0)
HEMATOCRIT: 35.8 % — AB (ref 40.0–52.0)
HEMOGLOBIN: 11.2 g/dL — AB (ref 13.0–18.0)
Hemoglobin: 10.4 g/dL — ABNORMAL LOW (ref 13.0–18.0)
MCH: 30.5 pg (ref 26.0–34.0)
MCH: 31 pg (ref 26.0–34.0)
MCHC: 30.8 g/dL — ABNORMAL LOW (ref 32.0–36.0)
MCHC: 31.4 g/dL — AB (ref 32.0–36.0)
MCV: 98.8 fL (ref 80.0–100.0)
MCV: 98.9 fL (ref 80.0–100.0)
PLATELETS: 102 10*3/uL — AB (ref 150–440)
Platelets: 109 10*3/uL — ABNORMAL LOW (ref 150–440)
RBC: 3.4 MIL/uL — AB (ref 4.40–5.90)
RBC: 3.62 MIL/uL — ABNORMAL LOW (ref 4.40–5.90)
RDW: 15.5 % — ABNORMAL HIGH (ref 11.5–14.5)
RDW: 15.7 % — ABNORMAL HIGH (ref 11.5–14.5)
WBC: 11.7 10*3/uL — AB (ref 3.8–10.6)
WBC: 4.9 10*3/uL (ref 3.8–10.6)

## 2017-06-14 MED ORDER — SODIUM CHLORIDE 0.9 % IV BOLUS (SEPSIS)
500.0000 mL | Freq: Once | INTRAVENOUS | Status: AC
  Administered 2017-06-14: 500 mL via INTRAVENOUS

## 2017-06-14 MED ORDER — HYDROCODONE-ACETAMINOPHEN 5-325 MG PO TABS: 1.0000 | ORAL_TABLET | ORAL | Status: DC | PRN

## 2017-06-14 MED ORDER — METOPROLOL SUCCINATE ER 25 MG PO TB24
25.0000 mg | ORAL_TABLET | Freq: Every day | ORAL | Status: DC
  Administered 2017-06-14: 25 mg via ORAL
  Filled 2017-06-14: qty 1

## 2017-06-14 MED ORDER — ETHAMBUTOL HCL 400 MG PO TABS
1200.0000 mg | ORAL_TABLET | ORAL | Status: DC
  Administered 2017-06-14 – 2017-06-16 (×2): 1200 mg via ORAL
  Filled 2017-06-14 (×2): qty 3

## 2017-06-14 MED ORDER — MORPHINE SULFATE (PF) 4 MG/ML IV SOLN
4.0000 mg | Freq: Once | INTRAVENOUS | Status: AC
  Administered 2017-06-14: 4 mg via INTRAVENOUS
  Filled 2017-06-14: qty 1

## 2017-06-14 MED ORDER — ACETAMINOPHEN 325 MG PO TABS
650.0000 mg | ORAL_TABLET | Freq: Four times a day (QID) | ORAL | Status: DC | PRN
  Administered 2017-06-15 (×2): 650 mg via ORAL
  Filled 2017-06-14 (×2): qty 2

## 2017-06-14 MED ORDER — RIFAMPIN 300 MG PO CAPS
600.0000 mg | ORAL_CAPSULE | ORAL | Status: DC
  Administered 2017-06-14 – 2017-06-16 (×2): 600 mg via ORAL
  Filled 2017-06-14 (×2): qty 2

## 2017-06-14 MED ORDER — TRAMADOL HCL 50 MG PO TABS
50.0000 mg | ORAL_TABLET | Freq: Four times a day (QID) | ORAL | Status: DC | PRN
  Administered 2017-06-14 (×2): 50 mg via ORAL
  Filled 2017-06-14 (×3): qty 1

## 2017-06-14 MED ORDER — ACETAMINOPHEN 650 MG RE SUPP: 650.0000 mg | Freq: Four times a day (QID) | RECTAL | Status: DC | PRN

## 2017-06-14 MED ORDER — DEXTROSE 5 % IV SOLN
INTRAVENOUS | Status: DC
  Administered 2017-06-14: 09:00:00 via INTRAVENOUS
  Administered 2017-06-14: 75 mL via INTRAVENOUS

## 2017-06-14 MED ORDER — MORPHINE SULFATE (PF) 4 MG/ML IV SOLN
INTRAVENOUS | Status: AC
  Filled 2017-06-14: qty 1

## 2017-06-14 MED ORDER — COLCHICINE 0.6 MG PO TABS
0.6000 mg | ORAL_TABLET | Freq: Every day | ORAL | Status: DC
  Administered 2017-06-14: 0.6 mg via ORAL
  Filled 2017-06-14: qty 1

## 2017-06-14 MED ORDER — SENNOSIDES-DOCUSATE SODIUM 8.6-50 MG PO TABS: 1.0000 | ORAL_TABLET | Freq: Every evening | ORAL | Status: DC | PRN

## 2017-06-14 MED ORDER — ALLOPURINOL 100 MG PO TABS
100.0000 mg | ORAL_TABLET | Freq: Every day | ORAL | Status: DC
  Administered 2017-06-14 – 2017-06-16 (×3): 100 mg via ORAL
  Filled 2017-06-14 (×3): qty 1

## 2017-06-14 MED ORDER — MORPHINE SULFATE (PF) 2 MG/ML IV SOLN
2.0000 mg | INTRAVENOUS | Status: DC | PRN
  Administered 2017-06-14: 2 mg via INTRAVENOUS
  Filled 2017-06-14: qty 1

## 2017-06-14 MED ORDER — AZITHROMYCIN 250 MG PO TABS
250.0000 mg | ORAL_TABLET | ORAL | Status: DC
  Administered 2017-06-14 – 2017-06-16 (×2): 250 mg via ORAL
  Filled 2017-06-14: qty 1

## 2017-06-14 MED ORDER — LIDOCAINE HCL (PF) 1 % IJ SOLN
INTRAMUSCULAR | Status: AC
  Administered 2017-06-14: 5 mL
  Filled 2017-06-14: qty 5

## 2017-06-14 MED ORDER — ONDANSETRON HCL 4 MG PO TABS: 4.0000 mg | ORAL_TABLET | Freq: Four times a day (QID) | ORAL | Status: DC | PRN

## 2017-06-14 MED ORDER — NITROGLYCERIN 0.4 MG SL SUBL: 0.4000 mg | SUBLINGUAL_TABLET | SUBLINGUAL | Status: DC | PRN

## 2017-06-14 MED ORDER — MORPHINE SULFATE (PF) 4 MG/ML IV SOLN
4.0000 mg | Freq: Once | INTRAVENOUS | Status: AC
  Administered 2017-06-14: 4 mg via INTRAMUSCULAR

## 2017-06-14 MED ORDER — ONDANSETRON HCL 4 MG/2ML IJ SOLN: 4.0000 mg | Freq: Four times a day (QID) | INTRAMUSCULAR | Status: DC | PRN

## 2017-06-14 NOTE — Clinical Social Work Note (Signed)
Clinical Social Work Assessment  Patient Details  Name: Jack Jenkins MRN: [id number] Date of Birth: 11/21/1930  Date of referral:  06/14/17               Reason for consult:  Facility Placement                Permission sought to share information with:  Facility Contact Representative Permission granted to share information::  Yes, Verbal Permission Granted  Name::      Skilled Nursing Facility   Agency::   [facility]   Relationship::     Contact Information:     Housing/Transportation Living arrangements for the past 2 months:  Single Family Home Source of Information:  Patient, Adult Children, Spouse Patient Interpreter Needed:  None Criminal Activity/Legal Involvement Pertinent to Current Situation/Hospitalization:  No - Comment as needed Significant Relationships:  Adult Children, Spouse Lives with:  Spouse Do you feel safe going back to the place where you live?  Yes Need for family participation in patient care:  Yes (Comment)  Care giving concerns:  Patient lives in [place] with his wife Jack Jenkins cell # [phone], home # [phone].    Social Worker assessment / plan:  Clinical Social Worker (CSW) received verbal consult from RN in progression rounds this morning that patient's wife is requesting SNF at [facility]. PT is pending. CSW met with patient and is wife Jack Jenkins and his son Jack Jenkins were at bedside. CSW introduced self and explained role of CSW department. Patient was alert and oriented X3 and was laying in the bed. Per wife patient lives with her in [place] and should would like for him to go to [facility] for rehab. CSW explained that PT will have to evaluate patient first and [employer] will have to approve SNF. Wife verbalized her understanding. FL2 complete and faxed out. CSW will continue to follow and assist as needed.   Employment status:  Disabled (Comment on whether or not currently receiving Disability), Retired Insurance information:  Managed Medicare PT  Recommendations:  Not assessed at this time Information / Referral to community resources:  Skilled Nursing Facility  Patient/Family's Response to care:  Patient's wife requested SNF placement at [facility].   Patient/Family's Understanding of and Emotional Response to Diagnosis, Current Treatment, and Prognosis:  Patient and his wife were very pleasant and thanked CSW for assistance.   Emotional Assessment Appearance:  Appears stated age Attitude/Demeanor/Rapport:    Affect (typically observed):  Accepting, Adaptable, Pleasant Orientation:  Oriented to Self, Oriented to Place, Oriented to  Time, Oriented to Situation Alcohol / Substance use:  Not Applicable Psych involvement (Current and /or in the community):  No (Comment)  Discharge Needs  Concerns to be addressed:  Discharge Planning Concerns Readmission within the last 30 days:  No Current discharge risk:  Dependent with Mobility Barriers to Discharge:  Continued Medical Work up   ,  M, LCSW 06/14/2017, 3:47 PM  

## 2017-06-14 NOTE — ED Notes (Signed)
Patient transported to CT 

## 2017-06-14 NOTE — Progress Notes (Signed)
Clinical Child psychotherapistocial Worker (CSW) presented bed offers to patient's wife Elease Hashimotoatricia and [facility]. Wife and patient are okay with a semi-private room. [facility].   [employer] Hughes IncorporatedBailey Bladyn Tipps, LCSW [phone]

## 2017-06-14 NOTE — ED Provider Notes (Signed)
[facility] Emergency Department Provider Note   ____________________________________________   First MD Initiated Contact with Patient 06/13/17 2334     (approximate)  I have reviewed the triage vital signs and the nursing notes.   HISTORY  Chief Complaint Fall    HPI Jack Jenkins is a 84 y.o. male who comes into the hospital today after a mechanical fall.  The patient reports that he was at home with his wife.  He was watching the football game and then went to bed after his wife did.  He did not turn on the lights and reports that there was a jacket on the back of the chair which he did not see.  The patient reports that he was standing close to a closet door and then backed up.  His foot slipped on the jacket and he tried to fall on the bed.  He states though that he was closer to the foot of the bed than he thought and he hit his head on a dresser which is located at the foot of the bed.  The patient states that he did not pass out.  He has some right shoulder pain and some head pain.  The patient rates his pain 8 out of 10 in intensity.  He is not on blood thinners.  The patient is here today for evaluation.   Past Medical History:  Diagnosis Date  . CHF (congestive heart failure) ([place])   . COPD (chronic obstructive pulmonary disease) ([place])   . Gout   . Renal disorder     Patient Active Problem List   Diagnosis Date Noted  . Humerus fracture 06/14/2017  . Dependence on supplemental oxygen 06/07/2017  . Interstitial pulmonary disease ([place]) 06/07/2017  . Mycobacterial infection 06/07/2017  . Chronic respiratory failure with hypoxia ([place]) 06/07/2017  . Pulmonary fibrosis ([place]) 06/07/2017  . 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 Sleep apnea 06/07/2017  . Bronchiolectasis ([place]) 06/07/2017  . COPD (chronic obstructive pulmonary disease) ([place]) 04/01/2017  . Benign essential HTN 11/24/2016  . Chronic diastolic CHF (congestive heart  failure), NYHA class 3 ([place]) 11/24/2016  . Hyperlipidemia, mixed 10/15/2016  . Chest pain 10/07/2016  . Acute respiratory failure ([place]) 04/21/2015  . Dyspnea 04/21/2015  . COPD exacerbation ([place]) 04/21/2015  . Abnormal chest CT 04/21/2015  . CKD (chronic kidney disease) 12/17/2014  . Photokeratitis 11/06/2014  . Allergic rhinitis 11/06/2014  . AB (asthmatic bronchitis) 11/06/2014  . CCF (congestive cardiac failure) ([place]) 11/06/2014  . Colon polyp 11/06/2014  . Urinary system disease 11/06/2014  . ED (erectile dysfunction) of organic origin 11/06/2014  . Acid reflux 11/06/2014  . Acute gouty arthropathy 11/06/2014  . Gout 11/06/2014  . Arthritis, degenerative 11/06/2014  . Fast heart beat 11/06/2014    Past Surgical History:  Procedure Laterality Date  . BACK SURGERY    . CATARACT EXTRACTION Bilateral   . CHOLECYSTECTOMY    . FLEXIBLE BRONCHOSCOPY N/A 02/09/2017   Procedure: FLEXIBLE BRONCHOSCOPY;  Surgeon: Allyne Gee, MD;  Location: [facility] ORS;  Service: Pulmonary;  Laterality: N/A;  . HERNIA REPAIR    . LAMINECTOMY    . POLYPECTOMY     colon  . UPPER GI ENDOSCOPY  01/15/04    Prior to Admission medications   Medication Sig Start Date End Date Taking? Authorizing Provider  azithromycin (ZITHROMAX) 250 MG tablet Take 1 tablet by mouth 3 (three) times a week. 03/24/17  Yes [provider]  ethambutol (MYAMBUTOL)  400 MG tablet Take 1,200 mg by mouth 3 (three) times a week. Monday, Wednesday, Friday 03/24/17  Yes [provider]  furosemide (LASIX) 40 MG tablet Take 1 tablet (40 mg total) by mouth daily as needed for fluid. Patient taking differently: Take 40 mg by mouth every other day. Monday, Wednesday, Friday 10/20/16  Yes [name]., MD  metoprolol succinate (TOPROL XL) 25 MG 24 hr tablet Take 1 tablet (25 mg total) by mouth daily. 10/20/16  Yes [name]., MD  nitroGLYCERIN (NITROSTAT) 0.4 MG SL tablet Place 1 tablet (0.4 mg total)  under the tongue every 5 (five) minutes as needed for chest pain. 10/08/16  Yes Wieting, Richard, MD  rifampin (RIFADIN) 300 MG capsule Take 600 mg by mouth 3 (three) times a week. Monday, Wednesday, Friday 02/16/17  Yes [provider]  allopurinol (ZYLOPRIM) 100 MG tablet Take 1 tablet (100 mg total) by mouth daily. Patient not taking: Reported on 06/14/2017 04/07/17   Bettey Costa, MD  aspirin EC 81 MG EC tablet Take 1 tablet (81 mg total) by mouth daily. Patient not taking: Reported on 06/14/2017 04/24/15   Fritzi Mandes, MD  clopidogrel (PLAVIX) 75 MG tablet Take 1 tablet (75 mg total) by mouth daily. Patient not taking: Reported on 10/09/2016 10/08/16 10/08/17  Loletha Grayer, MD  colchicine 0.6 MG tablet Take 1 tablet (0.6 mg total) by mouth daily. Patient not taking: Reported on 06/07/2017 04/08/17   Bettey Costa, MD  mometasone-formoterol (DULERA) 100-5 MCG/ACT AERO Inhale 2 puffs into the lungs 2 (two) times daily. Patient not taking: Reported on 06/14/2017 04/24/15   Fritzi Mandes, MD  tiotropium (SPIRIVA) 18 MCG inhalation capsule Place 1 capsule (18 mcg total) into inhaler and inhale daily. Patient not taking: Reported on 06/14/2017 04/24/15   Fritzi Mandes, MD    Allergies Patient has no known allergies.  Family History  Problem Relation Age of Onset  . Heart failure Mother   . Heart attack Father   . CAD Sister   . Lung cancer Brother   . CAD Sister     Social History Social History   Tobacco Use  . Smoking status: Never Smoker  . Smokeless tobacco: Never Used  Substance Use Topics  . Alcohol use: No    Alcohol/week: 0.0 oz  . Drug use: No    Review of Systems  Constitutional: No fever/chills Eyes: No visual changes. ENT: No sore throat. Cardiovascular: Denies chest pain. Respiratory: Denies shortness of breath. Gastrointestinal: No abdominal pain.  No nausea, no vomiting.  No diarrhea.  No constipation. Genitourinary: Negative for dysuria. Musculoskeletal: head  pain and right shoulder pain Skin: Negative for rash. Neurological: Negative for headaches, focal weakness or numbness.   ____________________________________________   PHYSICAL EXAM:  VITAL SIGNS: ED Triage Vitals  Enc Vitals Group     BP 06/13/17 2300 131/74     Pulse Rate 06/13/17 2300 (!) 104     Resp 06/13/17 2300 (!) 22     Temp --      Temp src --      SpO2 06/13/17 2300 94 %     Weight 06/13/17 2301 212 lb (96.2 kg)     Height --      Head Circumference --      Peak Flow --      Pain Score 06/13/17 2301 8     Pain Loc --      Pain Edu? --      Excl.  in [place]? --     Constitutional: Alert and oriented. Well appearing and in moderate distress. Eyes: Conjunctivae are normal. PERRL. EOMI. Head: 6cm laceration noted to right parietal scalp. Nose: No congestion/rhinnorhea. Mouth/Throat: Mucous membranes are moist.  Oropharynx non-erythematous. Neck: No cervical spine tenderness to palpation. Cardiovascular: Normal rate, regular rhythm. Grossly normal heart sounds.  Good peripheral circulation. Respiratory: Normal respiratory effort.  No retractions. Lungs CTAB. Gastrointestinal: Soft and nontender. No distention. Positive bowel sounds Musculoskeletal: tenderness to palpation of right shoulder with some crepitus, mild right hip tenderness to palpation, bilateral lower extremity edema Neurologic:  Normal speech and language. No gross focal neurologic deficits are appreciated. No gait instability. Skin:  Skin is warm, dry skin tear to right elbow, right scalp laceration. Psychiatric: Mood and affect are normal. Speech and behavior are normal.  ____________________________________________   LABS (all labs ordered are listed, but only abnormal results are displayed)  Labs Reviewed  CBC - Abnormal; Notable for the following components:      Result Value   RBC 3.62 (*)    Hemoglobin 11.2 (*)    HCT 35.8 (*)    MCHC 31.4 (*)    RDW 15.5 (*)    Platelets 109 (*)    All  other components within normal limits  BASIC METABOLIC PANEL - Abnormal; Notable for the following components:   Sodium 148 (*)    Chloride 99 (*)    CO2 42 (*)    Glucose, Bld 116 (*)    BUN 23 (*)    Creatinine, Ser 1.51 (*)    Calcium 8.8 (*)    GFR calc non Af Amer 40 (*)    GFR calc Af Amer 47 (*)    All other components within normal limits  BASIC METABOLIC PANEL  CBC   ____________________________________________  EKG  ED ECG REPORT I, Loney Hering, the attending physician, personally viewed and interpreted this ECG.   Date: 06/13/2017  EKG Time: 2259  Rate: 108  Rhythm: sinus tachycardia, RBBB, frequent PVC's noted, LPFB  Axis: right axis deviation  Intervals:right bundle branch block and left posterior fascicular block  ST&T Change: none  ____________________________________________  RADIOLOGY  ED MD interpretation:   Right shoulder x-ray shows: proximal humerus fracture with some mild displacement  CT head and cervical spine show: No intracranial hemorrhage, no skull fracture, no cervical spine fracture  Right hip x-ray: No hip fracture.  Official radiology report(s): Dg Shoulder Right  Result Date: 06/14/2017 CLINICAL DATA:  Status post fall, striking dresser. Right shoulder pain, acute onset. Initial encounter. EXAM: RIGHT SHOULDER - 2+ VIEW COMPARISON:  Chest radiograph performed 04/02/2017 FINDINGS: There is a mildly comminuted fracture of the proximal right humeral diaphysis, with minimal displacement. Overlying soft tissue swelling is noted. The right humeral head remains seated at the glenoid fossa. Mild degenerative change is noted at the right acromioclavicular joint. The visualized portions of the right lung are clear. IMPRESSION: Mildly comminuted fracture of the proximal right humeral diaphysis, with minimal displacement. Electronically Signed   By: Garald Balding M.D.   On: 06/14/2017 00:32   Ct Head Wo Contrast  Result Date:  06/14/2017 CLINICAL DATA:  [place] in dark bedroom, struck dresser. No loss of consciousness. RIGHT scalp laceration. EXAM: CT HEAD WITHOUT CONTRAST CT CERVICAL SPINE WITHOUT CONTRAST TECHNIQUE: Multidetector CT imaging of the head and cervical spine was performed following the standard protocol without intravenous contrast. Multiplanar CT image reconstructions of the cervical spine were also generated. COMPARISON:  CT HEAD April 01, 2017 FINDINGS: CT HEAD FINDINGS BRAIN: No intraparenchymal hemorrhage, mass effect nor midline shift. The ventricles and sulci are normal for age. Patchy supratentorial white matter hypodensities less than expected for patient's age, though non-specific are most compatible with chronic small vessel ischemic disease. LEFT inferior basal ganglia perivascular space. No acute large vascular territory infarcts. No abnormal extra-axial fluid collections. Basal cisterns are patent. VASCULAR: Mild calcific atherosclerosis of the carotid siphons. SKULL: No skull fracture. Minimal RIGHT frontal scalp swelling without subcutaneous gas or radiopaque bodies. The SINUSES/ORBITS: The mastoid air-cells and included paranasal sinuses are well-aerated.The included ocular globes and orbital contents are non-suspicious. Status post bilateral ocular lens implants. OTHER: None. CT CERVICAL SPINE FINDINGS-motion degraded examination. ALIGNMENT: Straightened lordosis.  Vertebral bodies in alignment. SKULL BASE AND VERTEBRAE: Cervical vertebral bodies and posterior elements are intact. Moderate C6-7 disc height loss and endplate spurring compatible with degenerative disc. Severe RIGHT upper cervical facet arthropathy. No destructive bony lesions. C1-2 articulation maintained. SOFT TISSUES AND SPINAL CANAL: Nonacute. Mild calcific atherosclerosis carotid bifurcations. DISC LEVELS: No significant osseous canal stenosis. Moderate to severe LEFT C3-4, RIGHT C4-5 neural foraminal narrowing. UPPER CHEST: Lung apices  are clear. OTHER: None. IMPRESSION: CT HEAD: 1. Negative noncontrast CT HEAD for age. CT CERVICAL SPINE: 1. No acute fracture or malalignment on this motion degraded examination. 2. Moderate to severe C3-4 and C4-5 neural foraminal narrowing. Electronically Signed   By: Elon Alas M.D.   On: 06/14/2017 00:21   Ct Cervical Spine Wo Contrast  Result Date: 06/14/2017 CLINICAL DATA:  [place] in dark bedroom, struck dresser. No loss of consciousness. RIGHT scalp laceration. EXAM: CT HEAD WITHOUT CONTRAST CT CERVICAL SPINE WITHOUT CONTRAST TECHNIQUE: Multidetector CT imaging of the head and cervical spine was performed following the standard protocol without intravenous contrast. Multiplanar CT image reconstructions of the cervical spine were also generated. COMPARISON:  CT HEAD April 01, 2017 FINDINGS: CT HEAD FINDINGS BRAIN: No intraparenchymal hemorrhage, mass effect nor midline shift. The ventricles and sulci are normal for age. Patchy supratentorial white matter hypodensities less than expected for patient's age, though non-specific are most compatible with chronic small vessel ischemic disease. LEFT inferior basal ganglia perivascular space. No acute large vascular territory infarcts. No abnormal extra-axial fluid collections. Basal cisterns are patent. VASCULAR: Mild calcific atherosclerosis of the carotid siphons. SKULL: No skull fracture. Minimal RIGHT frontal scalp swelling without subcutaneous gas or radiopaque bodies. The SINUSES/ORBITS: The mastoid air-cells and included paranasal sinuses are well-aerated.The included ocular globes and orbital contents are non-suspicious. Status post bilateral ocular lens implants. OTHER: None. CT CERVICAL SPINE FINDINGS-motion degraded examination. ALIGNMENT: Straightened lordosis.  Vertebral bodies in alignment. SKULL BASE AND VERTEBRAE: Cervical vertebral bodies and posterior elements are intact. Moderate C6-7 disc height loss and endplate spurring compatible  with degenerative disc. Severe RIGHT upper cervical facet arthropathy. No destructive bony lesions. C1-2 articulation maintained. SOFT TISSUES AND SPINAL CANAL: Nonacute. Mild calcific atherosclerosis carotid bifurcations. DISC LEVELS: No significant osseous canal stenosis. Moderate to severe LEFT C3-4, RIGHT C4-5 neural foraminal narrowing. UPPER CHEST: Lung apices are clear. OTHER: None. IMPRESSION: CT HEAD: 1. Negative noncontrast CT HEAD for age. CT CERVICAL SPINE: 1. No acute fracture or malalignment on this motion degraded examination. 2. Moderate to severe C3-4 and C4-5 neural foraminal narrowing. Electronically Signed   By: Elon Alas M.D.   On: 06/14/2017 00:21   Dg Hip Unilat W Or Wo Pelvis 2-3 Views Right  Result Date: 06/14/2017 CLINICAL DATA:  Pain after  a fall EXAM: DG HIP (WITH OR WITHOUT PELVIS) 2-3V RIGHT COMPARISON:  None. FINDINGS: No fracture or dislocation is seen.  Joint space is maintained. IMPRESSION: No acute osseous abnormality Electronically Signed   By: Donavan Foil M.D.   On: 06/14/2017 02:46    ____________________________________________   PROCEDURES  Procedure(s) performed: please, see procedure note(s).  Marland Kitchen.Laceration Repair Date/Time: 06/14/2017 2:00 AM Performed by: Loney Hering, MD Authorized by: Loney Hering, MD   Consent:    Consent obtained:  Verbal   Consent given by:  Patient   Risks discussed:  Infection, pain, retained foreign body, poor cosmetic result and poor wound healing Anesthesia (see MAR for exact dosages):    Anesthesia method:  Local infiltration   Local anesthetic:  Lidocaine 1% w/o epi Laceration details:    Location:  Scalp   Scalp location:  R parietal   Length (cm):  6 Repair type:    Repair type:  Simple Exploration:    Hemostasis achieved with:  Direct pressure   Wound exploration: entire depth of wound probed and visualized     Contaminated: no   Treatment:    Area cleansed with:  Saline   Amount of  cleaning:  Extensive   Irrigation solution:  Sterile saline   Visualized foreign bodies/material removed: no   Skin repair:    Repair method:  Staples   Number of staples:  7 Approximation:    Approximation:  Close Post-procedure details:    Dressing:  Sterile dressing and antibiotic ointment   Patient tolerance of procedure:  Tolerated well, no immediate complications    Critical Care performed: No  ____________________________________________   INITIAL IMPRESSION / ASSESSMENT AND PLAN / ED COURSE  As part of my medical decision making, I reviewed the following data within the electronic MEDICAL RECORD NUMBER Notes from prior ED visits and [place] Controlled Substance Database   This is an 84 year old male who comes into the hospital today after a mechanical fall.  The patient tripped on a jacket in the dark and hit his head on a dresser.  My differential diagnosis includes shoulder, clavicle, humerus fracture, skull fracture, intracranial hemorrhage.  I did send the patient for some imaging studies. It appears that the patient has a proximal humerus fracture.  The patient's other imaging is acutely unremarkable.  I did give the patient a dose of morphine.  We also checked some blood work which was also unremarkable.  The patient's pain improved slightly but then he had some more pain.  The patient's family states that he did not think he can manage at home as he uses his cane in the arm which is broken.  I gave the patient a second dose of medicine.  He did have some tachycardia so I will admit him to the hospital for the humerus fracture and pain control.  The patient also received an x-ray of his hip which was negative.      ____________________________________________   FINAL CLINICAL IMPRESSION(S) / ED DIAGNOSES  Final diagnoses:  Injury of head, initial encounter  Fall, initial encounter  Laceration of scalp, initial encounter  Other closed displaced fracture of proximal end  of right humerus, initial encounter     ED Discharge Orders    None       Note:  This document was prepared using Dragon voice recognition software and may include unintentional dictation errors.    Loney Hering, MD 06/14/17 [phone]

## 2017-06-14 NOTE — Evaluation (Signed)
Physical Therapy Evaluation Patient Details Name: Jack Jenkins MRN: [id number] DOB: 01-25-1931 Today's Date: 06/14/2017   History of Present Illness  Pt is an87 y.o.malewith a known history of congestive heart failure, COPD, gout, renal insufficiency presented to the emergency room with fall. Patient from watching [employer] Bowl tried to get around the living room he lost balance and fell down on the right side of the hip and right shoulder. Patient has pain in the right arm and right hip area. Pain is aching in nature 8 out of 10 on a scale of 1-10. The imaging studies showed fracture of the humerus on the right side.  Assessment includes: R proximal humerus fracture, acute hypernatremia, h/o MAI lung infection, h/o cardiomyopathy/systolic CHF, and acute on chronic kidney disease stage III.    Clinical Impression  Pt presents with deficits in strength, transfers, mobility, gait, balance, and activity tolerance.  Pt required +2 Max A for all bed mobility tasks and was SOB and nauseous upon sitting at EOB.  Pt's SpO2 90% after bed mobility tasks with HR 110 bpm compared to 89% and 99 bpm at rest on 4LO2/min.  Pt unable to stand from EOB secondary to weakness and fatigue.  Pt tolerated gentle R elbow, wrist, and hand ROM without increased pain except during elbow flex past 90-100 deg which elicited a slight increase in arm pain and was avoided during session.  Pt will benefit from PT services in a SNF setting upon discharge to safely address above deficits for decreased caregiver assistance and eventual return to PLOF.      Follow Up Recommendations SNF    Equipment Recommendations  None recommended by PT;Other (comment)(TBD at next venue of care)    Recommendations for Other Services       Precautions / Restrictions Precautions Precautions: Fall Required Braces or Orthoses: Sling Restrictions Weight Bearing Restrictions: Yes RUE Weight Bearing: Non weight bearing Other Position/Activity  Restrictions: Pt may perform RUE elbow, wrist and hand ROM and remove sling for hygiene      Mobility  Bed Mobility Overal bed mobility: Needs Assistance Bed Mobility: Supine to Sit;Sit to Supine     Supine to sit: +2 for physical assistance;Max assist Sit to supine: +2 for physical assistance;Max assist   General bed mobility comments: Pt required extensive +2 assist for all bed mobility tasks and was fatigued and nauseous upon sitting up to EOB   Transfers                 General transfer comment: Pt unable secondary to fatigue and weakness  Ambulation/Gait             General Gait Details: Unable/unsafe to attempt  Stairs            Wheelchair Mobility    Modified Rankin (Stroke Patients Only)       Balance Overall balance assessment: Needs assistance Sitting-balance support: Feet supported;Feet unsupported;Single extremity supported Sitting balance-Leahy Scale: Fair         Standing balance comment: Unable to stand                             Pertinent Vitals/Pain Pain Assessment: 0-10 Pain Score: 7  Pain Descriptors / Indicators: Aching;Sore Pain Intervention(s): Limited activity within patient's tolerance;Monitored during session;Premedicated before session    Home Living Family/patient expects to be discharged to:: Private residence Living Arrangements: Spouse/significant other Available Help at Discharge: Family;Available 24 hours/day Type of  Home: House Home Access: Level entry     Home Layout: One level Home Equipment: Cane - single point;Walker - 2 wheels;Walker - 4 wheels;Wheelchair - manual Additional Comments: Pt and spouse both report home is handicap accessible    Prior Function Level of Independence: Independent with assistive device(s)         Comments: Pt mod Ind with amb with SPC, occasionally uses WC for long distances, no other fall history     Hand Dominance   Dominant Hand: Right     Extremity/Trunk Assessment   Upper Extremity Assessment Upper Extremity Assessment: Generalized weakness;RUE deficits/detail RUE: Unable to fully assess due to immobilization;Unable to fully assess due to pain    Lower Extremity Assessment Lower Extremity Assessment: Generalized weakness       Communication   Communication: No difficulties  Cognition Arousal/Alertness: Awake/alert Behavior During Therapy: WFL for tasks assessed/performed Overall Cognitive Status: Within Functional Limits for tasks assessed                                        General Comments      Exercises Total Joint Exercises Ankle Circles/Pumps: AROM;Both;10 reps Quad Sets: Strengthening;Both;10 reps Gluteal Sets: Strengthening;Both;10 reps Hip ABduction/ADduction: AAROM;Both;5 reps Long Arc Quad: AROM;Both;5 reps Knee Flexion: AROM;Both;5 reps General Exercises - Upper Extremity Elbow Flexion: AAROM;Right;5 reps;10 reps Elbow Extension: AAROM;Right;10 reps;5 reps Wrist Flexion: AROM;Right;5 reps;10 reps Wrist Extension: AROM;Right;5 reps;10 reps Digit Composite Flexion: AROM;Right;10 reps Composite Extension: AROM;Right;5 reps   Assessment/Plan    PT Assessment Patient needs continued PT services  PT Problem List Decreased strength;Decreased activity tolerance;Decreased balance;Decreased mobility       PT Treatment Interventions DME instruction;Gait training;Functional mobility training;Balance training;Therapeutic exercise;Therapeutic activities;Patient/family education    PT Goals (Current goals can be found in the Care Plan section)  Acute Rehab PT Goals Patient Stated Goal: To walk better PT Goal Formulation: With patient Time For Goal Achievement: 06/27/17 Potential to Achieve Goals: Fair    Frequency 7X/week   Barriers to discharge Decreased caregiver support;Inaccessible home environment      Co-evaluation               AM-PAC PT "6 Clicks" Daily  Activity  Outcome Measure Difficulty turning over in bed (including adjusting bedclothes, sheets and blankets)?: Unable Difficulty moving from lying on back to sitting on the side of the bed? : Unable Difficulty sitting down on and standing up from a chair with arms (e.g., wheelchair, bedside commode, etc,.)?: Unable Help needed moving to and from a bed to chair (including a wheelchair)?: Total Help needed walking in hospital room?: Total Help needed climbing 3-5 steps with a railing? : Total 6 Click Score: 6    End of Session Equipment Utilized During Treatment: Oxygen Activity Tolerance: Patient limited by fatigue;Patient limited by pain Patient left: in bed;with call bell/phone within reach;with bed alarm set;with family/visitor present Nurse Communication: Mobility status PT Visit Diagnosis: Muscle weakness (generalized) (M62.81);Difficulty in walking, not elsewhere classified (R26.2)    Time: 6045-4098 PT Time Calculation (min) (ACUTE ONLY): 43 min   Charges:   PT Evaluation $PT Eval Low Complexity: 1 Low PT Treatments $Therapeutic Exercise: 8-22 mins   PT G Codes:        DElly Modena PT, DPT 06/14/17, 4:04 PM

## 2017-06-14 NOTE — ED Notes (Signed)
Admitting MD in with patient. 

## 2017-06-14 NOTE — [place] FL2 (Signed)
[place] MEDICAID FL2 LEVEL OF CARE SCREENING TOOL     IDENTIFICATION  Patient Name: Jack Jenkins A Bordley Birthdate: 03/24/1931 Sex: male Admission Date (Current Location): 06/13/2017  [place]   Facility and Address:  [facility], [address], [place], [place] [address]      Provider Number: [id number]  Attending Physician Name and Address:  Enedina FinnerPatel, Sona, MD  Relative Name and Phone Number:       Current Level of Care: Hospital Recommended Level of Care: Skilled Nursing Facility Prior Approval Number:    Date Approved/Denied:   PASRR Number: [id number])  Discharge Plan: SNF    Current Diagnoses: Patient Active Problem List   Diagnosis Date Noted  . Humerus fracture 06/14/2017  . Dependence on supplemental oxygen 06/07/2017  . Interstitial pulmonary disease (HCC) 06/07/2017  . Mycobacterial infection 06/07/2017  . Chronic respiratory failure with hypoxia (HCC) 06/07/2017  . Pulmonary fibrosis (HCC) 06/07/2017  . 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 Sleep apnea 06/07/2017  . Bronchiolectasis (HCC) 06/07/2017  . COPD (chronic obstructive pulmonary disease) (HCC) 04/01/2017  . Benign essential HTN 11/24/2016  . Chronic diastolic CHF (congestive heart failure), NYHA class 3 (HCC) 11/24/2016  . Hyperlipidemia, mixed 10/15/2016  . Chest pain 10/07/2016  . Acute respiratory failure (HCC) 04/21/2015  . Dyspnea 04/21/2015  . COPD exacerbation (HCC) 04/21/2015  . Abnormal chest CT 04/21/2015  . CKD (chronic kidney disease) 12/17/2014  . Photokeratitis 11/06/2014  . Allergic rhinitis 11/06/2014  . AB (asthmatic bronchitis) 11/06/2014  . CCF (congestive cardiac failure) (HCC) 11/06/2014  . Colon polyp 11/06/2014  . Urinary system disease 11/06/2014  . ED (erectile dysfunction) of organic origin 11/06/2014  . Acid reflux 11/06/2014  . Acute gouty arthropathy 11/06/2014  . Gout 11/06/2014  . Arthritis,  degenerative 11/06/2014  . Fast heart beat 11/06/2014    Orientation RESPIRATION BLADDER Height & Weight     Self, Time, Situation, Place  O2(3 Liters Oxygen. ) Continent Weight: 212 lb 1.6 oz (96.2 kg) Height:  5\' 9"  (175.3 cm)  BEHAVIORAL SYMPTOMS/MOOD NEUROLOGICAL BOWEL NUTRITION STATUS      Continent Diet(Diet: Heart Healthy )  AMBULATORY STATUS COMMUNICATION OF NEEDS Skin   Extensive Assist Verbally Other (Comment)(laceration to right head. )                       Personal Care Assistance Level of Assistance  Bathing, Feeding, Dressing Bathing Assistance: Limited assistance Feeding assistance: Independent Dressing Assistance: Limited assistance     Functional Limitations Info  Sight, Hearing, Speech Sight Info: Adequate Hearing Info: Impaired Speech Info: Adequate    SPECIAL CARE FACTORS FREQUENCY  PT (By licensed PT), OT (By licensed OT)     PT Frequency: (5) OT Frequency: (5)            Contractures      Additional Factors Info  Code Status, Allergies Code Status Info: (Full Code. ) Allergies Info: (No Known Allergies. )           Current Medications (06/14/2017):  This is the current hospital active medication list Current Facility-Administered Medications  Medication Dose Route Frequency Provider Last Rate Last Dose  . acetaminophen (TYLENOL) tablet 650 mg  650 mg Oral Q6H PRN Pyreddy, Vivien RotaPavan, MD       Or  . acetaminophen (TYLENOL) suppository 650 mg  650 mg Rectal Q6H PRN Ihor AustinPyreddy, Pavan, MD      . allopurinol (ZYLOPRIM) tablet  100 mg  100 mg Oral Daily Pyreddy, Vivien Rota, MD   100 mg at 06/14/17 0942  . azithromycin (ZITHROMAX) tablet 250 mg  250 mg Oral Once per day on Mon Wed Fri Patel, Sona, MD   250 mg at 06/14/17 1342  . dextrose 5 % solution   Intravenous Continuous Enedina Finner, MD 75 mL/hr at 06/14/17 [phone]    . ethambutol (MYAMBUTOL) tablet 1,200 mg  1,200 mg Oral Once per day on Mon Wed Fri Patel, Sona, MD   1,200 mg at 06/14/17 9604  .  HYDROcodone-acetaminophen (NORCO/VICODIN) 5-325 MG per tablet 1-2 tablet  1-2 tablet Oral Q4H PRN Pyreddy, Vivien Rota, MD      . metoprolol succinate (TOPROL-XL) 24 hr tablet 25 mg  25 mg Oral Daily Pyreddy, Vivien Rota, MD   25 mg at 06/14/17 0942  . morphine 2 MG/ML injection 2 mg  2 mg Intravenous Q4H PRN Ihor Austin, MD   2 mg at 06/14/17 0214  . nitroGLYCERIN (NITROSTAT) SL tablet 0.4 mg  0.4 mg Sublingual Q5 min PRN Pyreddy, Vivien Rota, MD      . ondansetron (ZOFRAN) injection 4 mg  4 mg Intravenous Once Rebecka Apley, MD      . ondansetron [facility]) tablet 4 mg  4 mg Oral Q6H PRN Ihor Austin, MD       Or  . ondansetron (ZOFRAN) injection 4 mg  4 mg Intravenous Q6H PRN Pyreddy, Pavan, MD      . rifampin (RIFADIN) capsule 600 mg  600 mg Oral Once per day on Mon Wed Fri Patel, Sona, MD   600 mg at 06/14/17 5409  . senna-docusate (Senokot-S) tablet 1 tablet  1 tablet Oral QHS PRN Pyreddy, Vivien Rota, MD      . traMADol Janean Sark) tablet 50 mg  50 mg Oral Q6H PRN Lyndle Herrlich, MD   50 mg at 06/14/17 8119     Discharge Medications: Please see discharge summary for a list of discharge medications.  Relevant Imaging Results:  Relevant Lab Results:   Additional Information (SSN: [id number])  Chaniqua Brisby, Darleen Crocker, LCSW

## 2017-06-14 NOTE — Clinical Social Work Placement (Signed)
   CLINICAL SOCIAL WORK PLACEMENT  NOTE  Date:  06/14/2017  Patient Details  Name: Jack Jenkins MRN: [id number] Date of Birth: 03/08/1931  Clinical Social Work is seeking post-discharge placement for this patient at the Skilled  Nursing Facility level of care (*CSW will initial, date and re-position this form in  chart as items are completed):  Yes   Patient/family provided with [place] Clinical Social Work Department's list of facilities offering this level of care within the geographic area requested by the patient (or if unable, by the patient's family).  Yes   Patient/family informed of their freedom to choose among providers that offer the needed level of care, that participate in Medicare, Medicaid or managed care program needed by the patient, have an available bed and are willing to accept the patient.  Yes   Patient/family informed of [facility]'s ownership interest in [facility] and [facility], as well as of the fact that they are under no obligation to receive care at these facilities.  PASRR submitted to EDS on 06/14/17     PASRR number received on 06/14/17     Existing PASRR number confirmed on       FL2 transmitted to all facilities in geographic area requested by pt/family on 06/14/17     FL2 transmitted to all facilities within larger geographic area on       Patient informed that his/her managed care company has contracts with or will negotiate with certain facilities, including the following:            Patient/family informed of bed offers received.  Patient chooses bed at       Physician recommends and patient chooses bed at      Patient to be transferred to   on  .  Patient to be transferred to facility by       Patient family notified on   of transfer.  Name of family member notified:        PHYSICIAN       Additional Comment:    _______________________________________________ Jamirra Curnow, Darleen CrockerBailey M, LCSW 06/14/2017, 3:46 PM

## 2017-06-14 NOTE — Progress Notes (Addendum)
[facility]   PATIENT NAME: Jack Jenkins    MR#:  [id number]  DATE OF BIRTH:  1930/07/06  SUBJECTIVE:   Patient came in after he had a mechanical fall at home.  Started having right shoulder pain.  Found to have communicated proximal humerus fracture. Patient has chronic shortness of breath and on home oxygen about 2-3 L/min REVIEW OF SYSTEMS:   Review of Systems  Constitutional: Negative for chills, fever and weight loss.  HENT: Negative for ear discharge, ear pain and nosebleeds.   Eyes: Negative for blurred vision, pain and discharge.  Respiratory: Positive for cough and shortness of breath. Negative for sputum production, wheezing and stridor.   Cardiovascular: Negative for chest pain, palpitations, orthopnea and PND.  Gastrointestinal: Negative for abdominal pain, diarrhea, nausea and vomiting.  Genitourinary: Negative for frequency and urgency.  Musculoskeletal: Positive for falls and joint pain. Negative for back pain.  Neurological: Positive for weakness. Negative for sensory change, speech change and focal weakness.  Psychiatric/Behavioral: Negative for depression and hallucinations. The patient is not nervous/anxious.    Tolerating Diet: Yes tolerating PT: Pending  DRUG ALLERGIES:  No Known Allergies  VITALS:  Blood pressure (!) 115/58, pulse (!) 118, temperature 97.8 F (36.6 C), temperature source Axillary, resp. rate (!) 29, height 5\' 9"  (1.753 m), weight 96.2 kg (212 lb 1.6 oz), SpO2 92 %.  PHYSICAL EXAMINATION:   Physical Exam  GENERAL:  84 y.o.-year-old patient lying in the bed with no acute distress.  EYES: Pupils equal, round, reactive to light and accommodation. No scleral icterus. Extraocular muscles intact.  HEENT: Head atraumatic, normocephalic. Oropharynx and nasopharynx clear.  NECK:  Supple, no jugular venous distention. No thyroid enlargement, no tenderness.  LUNGS: Normal breath sounds bilaterally, no  wheezing, rales, rhonchi. No use of accessory muscles of respiration.  CARDIOVASCULAR: S1, S2 normal. No murmurs, rubs, or gallops.  ABDOMEN: Soft, nontender, nondistended. Bowel sounds present. No organomegaly or mass.  EXTREMITIES: No cyanosis, clubbing or edema b/l.   Right UE sling NEUROLOGIC: Cranial nerves II through XII are intact. No focal Motor or sensory deficits b/l.   PSYCHIATRIC:  patient is alert and oriented x 3.  SKIN: No obvious rash, lesion, or ulcer.   LABORATORY PANEL:  CBC Recent Labs  Lab 06/14/17 0432  WBC 11.7*  HGB 10.4*  HCT 33.6*  PLT 102*    Chemistries  Recent Labs  Lab 06/07/17 1508  06/14/17 0432  NA 150*   < > 151*  K 4.4   < > 4.5  CL 99   < > 100*  CO2 38*   < > 40*  GLUCOSE 92   < > 154*  BUN 17   < > 25*  CREATININE 1.47*   < > 1.65*  CALCIUM 9.3   < > 8.8*  AST 23  --   --   ALT 14  --   --   ALKPHOS 87  --   --   BILITOT 0.2  --   --    < > = values in this interval not displayed.   Cardiac Enzymes No results for input(s): TROPONINI in the last 168 hours. RADIOLOGY:  Dg Shoulder Right  Result Date: 06/14/2017 CLINICAL DATA:  Status post fall, striking dresser. Right shoulder pain, acute onset. Initial encounter. EXAM: RIGHT SHOULDER - 2+ VIEW COMPARISON:  Chest radiograph performed 04/02/2017 FINDINGS: There is a mildly comminuted fracture of the proximal right humeral diaphysis, with  minimal displacement. Overlying soft tissue swelling is noted. The right humeral head remains seated at the glenoid fossa. Mild degenerative change is noted at the right acromioclavicular joint. The visualized portions of the right lung are clear. IMPRESSION: Mildly comminuted fracture of the proximal right humeral diaphysis, with minimal displacement. Electronically Signed   By: Roanna RaiderJeffery  Chang M.D.   On: 06/14/2017 00:32   Ct Head Wo Contrast  Result Date: 06/14/2017 CLINICAL DATA:  Larey SeatFell in dark bedroom, struck dresser. No loss of consciousness. RIGHT  scalp laceration. EXAM: CT HEAD WITHOUT CONTRAST CT CERVICAL SPINE WITHOUT CONTRAST TECHNIQUE: Multidetector CT imaging of the head and cervical spine was performed following the standard protocol without intravenous contrast. Multiplanar CT image reconstructions of the cervical spine were also generated. COMPARISON:  CT HEAD April 01, 2017 FINDINGS: CT HEAD FINDINGS BRAIN: No intraparenchymal hemorrhage, mass effect nor midline shift. The ventricles and sulci are normal for age. Patchy supratentorial white matter hypodensities less than expected for patient's age, though non-specific are most compatible with chronic small vessel ischemic disease. LEFT inferior basal ganglia perivascular space. No acute large vascular territory infarcts. No abnormal extra-axial fluid collections. Basal cisterns are patent. VASCULAR: Mild calcific atherosclerosis of the carotid siphons. SKULL: No skull fracture. Minimal RIGHT frontal scalp swelling without subcutaneous gas or radiopaque bodies. The SINUSES/ORBITS: The mastoid air-cells and included paranasal sinuses are well-aerated.The included ocular globes and orbital contents are non-suspicious. Status post bilateral ocular lens implants. OTHER: None. CT CERVICAL SPINE FINDINGS-motion degraded examination. ALIGNMENT: Straightened lordosis.  Vertebral bodies in alignment. SKULL BASE AND VERTEBRAE: Cervical vertebral bodies and posterior elements are intact. Moderate C6-7 disc height loss and endplate spurring compatible with degenerative disc. Severe RIGHT upper cervical facet arthropathy. No destructive bony lesions. C1-2 articulation maintained. SOFT TISSUES AND SPINAL CANAL: Nonacute. Mild calcific atherosclerosis carotid bifurcations. DISC LEVELS: No significant osseous canal stenosis. Moderate to severe LEFT C3-4, RIGHT C4-5 neural foraminal narrowing. UPPER CHEST: Lung apices are clear. OTHER: None. IMPRESSION: CT HEAD: 1. Negative noncontrast CT HEAD for age. CT CERVICAL  SPINE: 1. No acute fracture or malalignment on this motion degraded examination. 2. Moderate to severe C3-4 and C4-5 neural foraminal narrowing. Electronically Signed   By: Awilda Metroourtnay  Bloomer M.D.   On: 06/14/2017 00:21   Ct Cervical Spine Wo Contrast  Result Date: 06/14/2017 CLINICAL DATA:  Larey SeatFell in dark bedroom, struck dresser. No loss of consciousness. RIGHT scalp laceration. EXAM: CT HEAD WITHOUT CONTRAST CT CERVICAL SPINE WITHOUT CONTRAST TECHNIQUE: Multidetector CT imaging of the head and cervical spine was performed following the standard protocol without intravenous contrast. Multiplanar CT image reconstructions of the cervical spine were also generated. COMPARISON:  CT HEAD April 01, 2017 FINDINGS: CT HEAD FINDINGS BRAIN: No intraparenchymal hemorrhage, mass effect nor midline shift. The ventricles and sulci are normal for age. Patchy supratentorial white matter hypodensities less than expected for patient's age, though non-specific are most compatible with chronic small vessel ischemic disease. LEFT inferior basal ganglia perivascular space. No acute large vascular territory infarcts. No abnormal extra-axial fluid collections. Basal cisterns are patent. VASCULAR: Mild calcific atherosclerosis of the carotid siphons. SKULL: No skull fracture. Minimal RIGHT frontal scalp swelling without subcutaneous gas or radiopaque bodies. The SINUSES/ORBITS: The mastoid air-cells and included paranasal sinuses are well-aerated.The included ocular globes and orbital contents are non-suspicious. Status post bilateral ocular lens implants. OTHER: None. CT CERVICAL SPINE FINDINGS-motion degraded examination. ALIGNMENT: Straightened lordosis.  Vertebral bodies in alignment. SKULL BASE AND VERTEBRAE: Cervical vertebral bodies and posterior elements  are intact. Moderate C6-7 disc height loss and endplate spurring compatible with degenerative disc. Severe RIGHT upper cervical facet arthropathy. No destructive bony lesions.  C1-2 articulation maintained. SOFT TISSUES AND SPINAL CANAL: Nonacute. Mild calcific atherosclerosis carotid bifurcations. DISC LEVELS: No significant osseous canal stenosis. Moderate to severe LEFT C3-4, RIGHT C4-5 neural foraminal narrowing. UPPER CHEST: Lung apices are clear. OTHER: None. IMPRESSION: CT HEAD: 1. Negative noncontrast CT HEAD for age. CT CERVICAL SPINE: 1. No acute fracture or malalignment on this motion degraded examination. 2. Moderate to severe C3-4 and C4-5 neural foraminal narrowing. Electronically Signed   By: Awilda Metro M.D.   On: 06/14/2017 00:21   Dg Hip Unilat W Or Wo Pelvis 2-3 Views Right  Result Date: 06/14/2017 CLINICAL DATA:  Pain after a fall EXAM: DG HIP (WITH OR WITHOUT PELVIS) 2-3V RIGHT COMPARISON:  None. FINDINGS: No fracture or dislocation is seen.  Joint space is maintained. IMPRESSION: No acute osseous abnormality Electronically Signed   By: Jasmine Pang M.D.   On: 06/14/2017 02:46   ASSESSMENT AND PLAN:  Lavoris Sparling  is a 84 y.o. male with a known history of congestive heart failure, COPD, gout, renal insufficiency presented to the emergency room with fall.  Patient from watching [employer] tried to get around the living room he lost balance and fell down on the right side of the hip and right shoulder.  Patient has pain in the right arm and right hip area.  Pain is aching in nature 8 out of 10 on a scale of 1-10.  The imaging studies showed fracture of the humerus on the right side.  1.  Right proximal humerus fracture status post mechanical fall at home -Patient has right arm sling. -Orthopedic consult placed -PRN pain meds -Physical therapy  2.  Acute hypernatremia -IV D5 water at 75 an hour -Follow sodium levels -Holding Lasix today  3.  History of chronic MAI lung infection -Follows with Dr. Carolynne Edouard -We will continue ethambutol, azithromycin, rifampin Monday Wednesday Friday--- home meds  4.  History of cardiomyopathy/systolic  CHF -Patient on chronic oxygen. -He currently is not in heart failure  5.  Acute on chronic kidney disease stage III -baseline Creatinine around 1.4 -1.51--1.65--D5W--BMP tomorrow  6.  DVT prophylaxis subcu Lovenox Case discussed with Care Management/Social Worker. Management plans discussed with the patient, family and they are in agreement.  CODE STATUS: Full  DVT Prophylaxis: Lovenox Discussed with wife in the room  TOTAL TIME TAKING CARE OF THIS PATIENT: *30* minutes.  >50% time spent on counselling and coordination of care  POSSIBLE D/C IN 1-2 DAYS, DEPENDING ON CLINICAL CONDITION.  Note: This dictation was prepared with Dragon dictation along with smaller phrase technology. Any transcriptional errors that result from this process are unintentional.  Enedina Finner M.D on 06/14/2017 at 10:27 AM  Between 7am to 6pm - Pager - [phone]  After 6pm go to [email or web address] - password [email or web address]  [facility]  Office  [phone]  CC: Primary care physician; Maple Hudson., MDPatient ID: Jack Jenkins, male   DOB: 1931/01/19, 84 y.o.   MRN: [id number]

## 2017-06-14 NOTE — H&P (Signed)
[facility] NAME: Jack Jenkins    MR#:  [id number]  DATE OF BIRTH:  1930/08/24  DATE OF ADMISSION:  06/13/2017  PRIMARY CARE PHYSICIAN: [name]., MD   REQUESTING/REFERRING PHYSICIAN:   CHIEF COMPLAINT:   Chief Complaint  Patient presents with  . Fall    HISTORY OF PRESENT ILLNESS: Jack Jenkins  is a 84 y.o. male with a known history of congestive heart failure, COPD, gout, renal insufficiency presented to the emergency room with fall.  Patient from watching [employer] tried to get around the living room he lost balance and fell down on the right side of the hip and right shoulder.  Patient has pain in the right arm and right hip area.  Pain is aching in nature 8 out of 10 on a scale of 1-10.  The imaging studies showed fracture of the humerus on the right side.  Patient also had some laceration to the frontal region of the head and he had staples placed in the emergency room.  Hospitalist service was consulted for further care.  Patient was put in a sling for the right humerus fracture.  Patient was worked up with CT head which showed no acute abnormality and CT cervical spine which showed no fracture. PAST MEDICAL HISTORY:   Past Medical History:  Diagnosis Date  . CHF (congestive heart failure) ([place])   . COPD (chronic obstructive pulmonary disease) ([place])   . Gout   . Renal disorder     PAST SURGICAL HISTORY:  Past Surgical History:  Procedure Laterality Date  . BACK SURGERY    . CATARACT EXTRACTION Bilateral   . CHOLECYSTECTOMY    . FLEXIBLE BRONCHOSCOPY N/A 02/09/2017   Procedure: FLEXIBLE BRONCHOSCOPY;  Surgeon: Allyne Gee, MD;  Location: [facility] ORS;  Service: Pulmonary;  Laterality: N/A;  . HERNIA REPAIR    . LAMINECTOMY    . POLYPECTOMY     colon  . UPPER GI ENDOSCOPY  01/15/04    SOCIAL HISTORY:  Social History   Tobacco Use  . Smoking status: Never Smoker  . Smokeless tobacco: Never Used   Substance Use Topics  . Alcohol use: No    Alcohol/week: 0.0 oz    FAMILY HISTORY:  Family History  Problem Relation Age of Onset  . Heart failure Mother   . Heart attack Father   . CAD Sister   . Lung cancer Brother   . CAD Sister     DRUG ALLERGIES: No Known Allergies  REVIEW OF SYSTEMS:   CONSTITUTIONAL: No fever, fatigue or weakness.  EYES: No blurred or double vision.  EARS, NOSE, AND THROAT: No tinnitus or ear pain.  RESPIRATORY: No cough, shortness of breath, wheezing or hemoptysis.  CARDIOVASCULAR: No chest pain, orthopnea, edema.  GASTROINTESTINAL: No nausea, vomiting, diarrhea or abdominal pain.  GENITOURINARY: No dysuria, hematuria.  ENDOCRINE: No polyuria, nocturia,  HEMATOLOGY: No anemia, easy bruising or bleeding SKIN: No rash or lesion. MUSCULOSKELETAL: Right shoulder and right arm pain Right hip pain. NEUROLOGIC: No tingling, numbness, weakness.  PSYCHIATRY: No anxiety or depression.   MEDICATIONS AT HOME:  Prior to Admission medications   Medication Sig Start Date End Date Taking? Authorizing Provider  azithromycin (ZITHROMAX) 250 MG tablet Take 1 tablet by mouth 3 (three) times a week. 03/24/17  Yes [provider]  ethambutol (MYAMBUTOL) 400 MG tablet Take 1,200 mg by mouth 3 (three) times a week. Monday, Wednesday, Friday 03/24/17  Yes [provider]  furosemide (LASIX) 40 MG tablet Take 1 tablet (40 mg total) by mouth daily as needed for fluid. Patient taking differently: Take 40 mg by mouth every other day. Monday, Wednesday, Friday 10/20/16  Yes [name]., MD  metoprolol succinate (TOPROL XL) 25 MG 24 hr tablet Take 1 tablet (25 mg total) by mouth daily. 10/20/16  Yes [name]., MD  nitroGLYCERIN (NITROSTAT) 0.4 MG SL tablet Place 1 tablet (0.4 mg total) under the tongue every 5 (five) minutes as needed for chest pain. 10/08/16  Yes Wieting, Richard, MD  rifampin (RIFADIN) 300 MG capsule Take 600 mg by mouth  3 (three) times a week. Monday, Wednesday, Friday 02/16/17  Yes [provider]  allopurinol (ZYLOPRIM) 100 MG tablet Take 1 tablet (100 mg total) by mouth daily. Patient not taking: Reported on 06/14/2017 04/07/17   Bettey Costa, MD  aspirin EC 81 MG EC tablet Take 1 tablet (81 mg total) by mouth daily. Patient not taking: Reported on 06/14/2017 04/24/15   Fritzi Mandes, MD  clopidogrel (PLAVIX) 75 MG tablet Take 1 tablet (75 mg total) by mouth daily. Patient not taking: Reported on 10/09/2016 10/08/16 10/08/17  Loletha Grayer, MD  colchicine 0.6 MG tablet Take 1 tablet (0.6 mg total) by mouth daily. Patient not taking: Reported on 06/07/2017 04/08/17   Bettey Costa, MD  mometasone-formoterol (DULERA) 100-5 MCG/ACT AERO Inhale 2 puffs into the lungs 2 (two) times daily. Patient not taking: Reported on 06/14/2017 04/24/15   Fritzi Mandes, MD  tiotropium (SPIRIVA) 18 MCG inhalation capsule Place 1 capsule (18 mcg total) into inhaler and inhale daily. Patient not taking: Reported on 06/14/2017 04/24/15   Fritzi Mandes, MD      PHYSICAL EXAMINATION:   VITAL SIGNS: Blood pressure 111/64, pulse (!) 124, resp. rate (!) 29, weight 96.2 kg (212 lb), SpO2 100 %.  GENERAL:  84 y.o.-year-old patient lying in the bed with no acute distress.  EYES: Pupils equal, round, reactive to light and accommodation. No scleral icterus. Extraocular muscles intact.  HEENT: Head atraumatic, normocephalic. Oropharynx and nasopharynx clear.  NECK:  Supple, no jugular venous distention. No thyroid enlargement, no tenderness.  LUNGS: Normal breath sounds bilaterally, no wheezing, rales,rhonchi or crepitation. No use of accessory muscles of respiration.  CARDIOVASCULAR: S1, S2 tachycardia noted. No murmurs, rubs, or gallops.  ABDOMEN: Soft, nontender, nondistended. Bowel sounds present. No organomegaly or mass.  EXTREMITIES: Has pedal edema,  No cyanosis, or clubbing.  Tenderness right arm and shoulder Tenderness in the right  hip NEUROLOGIC: Cranial nerves II through XII are intact. Muscle strength 5/5 in all extremities. Sensation intact. Gait not checked.  PSYCHIATRIC: The patient is alert and oriented x 3.  SKIN: No obvious rash, lesion, or ulcer.   LABORATORY PANEL:   CBC Recent Labs  Lab 06/07/17 1508 06/13/17 2347  WBC 5.3 4.9  HGB 11.4* 11.2*  HCT 37.3* 35.8*  PLT 147* 109*  MCV 99* 98.9  MCH 30.2 31.0  MCHC 30.6* 31.4*  RDW 14.0 15.5*  LYMPHSABS 1.3  --   EOSABS 0.2  --   BASOSABS 0.0  --    ------------------------------------------------------------------------------------------------------------------  Chemistries  Recent Labs  Lab 06/07/17 1508 06/13/17 2347  NA 150* 148*  K 4.4 4.4  CL 99 99*  CO2 38* 42*  GLUCOSE 92 116*  BUN 17 23*  CREATININE 1.47* 1.51*  CALCIUM 9.3 8.8*  AST 23  --   ALT 14  --  ALKPHOS 87  --   BILITOT 0.2  --    ------------------------------------------------------------------------------------------------------------------ estimated creatinine clearance is 40.9 mL/min (A) (by C-G formula based on SCr of 1.51 mg/dL (H)). ------------------------------------------------------------------------------------------------------------------ No results for input(s): TSH, T4TOTAL, T3FREE, THYROIDAB in the last 72 hours.  Invalid input(s): FREET3   Coagulation profile No results for input(s): INR, PROTIME in the last 168 hours. ------------------------------------------------------------------------------------------------------------------- No results for input(s): DDIMER in the last 72 hours. -------------------------------------------------------------------------------------------------------------------  Cardiac Enzymes No results for input(s): CKMB, TROPONINI, MYOGLOBIN in the last 168 hours.  Invalid input(s): CK ------------------------------------------------------------------------------------------------------------------ Invalid  input(s): POCBNP  ---------------------------------------------------------------------------------------------------------------  Urinalysis No results found for: COLORURINE, APPEARANCEUR, LABSPEC, PHURINE, GLUCOSEU, HGBUR, BILIRUBINUR, KETONESUR, PROTEINUR, UROBILINOGEN, NITRITE, LEUKOCYTESUR   RADIOLOGY: Dg Shoulder Right  Result Date: 06/14/2017 CLINICAL DATA:  Status post fall, striking dresser. Right shoulder pain, acute onset. Initial encounter. EXAM: RIGHT SHOULDER - 2+ VIEW COMPARISON:  Chest radiograph performed 04/02/2017 FINDINGS: There is a mildly comminuted fracture of the proximal right humeral diaphysis, with minimal displacement. Overlying soft tissue swelling is noted. The right humeral head remains seated at the glenoid fossa. Mild degenerative change is noted at the right acromioclavicular joint. The visualized portions of the right lung are clear. IMPRESSION: Mildly comminuted fracture of the proximal right humeral diaphysis, with minimal displacement. Electronically Signed   By: Garald Balding M.D.   On: 06/14/2017 00:32   Ct Head Wo Contrast  Result Date: 06/14/2017 CLINICAL DATA:  [place] in dark bedroom, struck dresser. No loss of consciousness. RIGHT scalp laceration. EXAM: CT HEAD WITHOUT CONTRAST CT CERVICAL SPINE WITHOUT CONTRAST TECHNIQUE: Multidetector CT imaging of the head and cervical spine was performed following the standard protocol without intravenous contrast. Multiplanar CT image reconstructions of the cervical spine were also generated. COMPARISON:  CT HEAD April 01, 2017 FINDINGS: CT HEAD FINDINGS BRAIN: No intraparenchymal hemorrhage, mass effect nor midline shift. The ventricles and sulci are normal for age. Patchy supratentorial white matter hypodensities less than expected for patient's age, though non-specific are most compatible with chronic small vessel ischemic disease. LEFT inferior basal ganglia perivascular space. No acute large vascular territory  infarcts. No abnormal extra-axial fluid collections. Basal cisterns are patent. VASCULAR: Mild calcific atherosclerosis of the carotid siphons. SKULL: No skull fracture. Minimal RIGHT frontal scalp swelling without subcutaneous gas or radiopaque bodies. The SINUSES/ORBITS: The mastoid air-cells and included paranasal sinuses are well-aerated.The included ocular globes and orbital contents are non-suspicious. Status post bilateral ocular lens implants. OTHER: None. CT CERVICAL SPINE FINDINGS-motion degraded examination. ALIGNMENT: Straightened lordosis.  Vertebral bodies in alignment. SKULL BASE AND VERTEBRAE: Cervical vertebral bodies and posterior elements are intact. Moderate C6-7 disc height loss and endplate spurring compatible with degenerative disc. Severe RIGHT upper cervical facet arthropathy. No destructive bony lesions. C1-2 articulation maintained. SOFT TISSUES AND SPINAL CANAL: Nonacute. Mild calcific atherosclerosis carotid bifurcations. DISC LEVELS: No significant osseous canal stenosis. Moderate to severe LEFT C3-4, RIGHT C4-5 neural foraminal narrowing. UPPER CHEST: Lung apices are clear. OTHER: None. IMPRESSION: CT HEAD: 1. Negative noncontrast CT HEAD for age. CT CERVICAL SPINE: 1. No acute fracture or malalignment on this motion degraded examination. 2. Moderate to severe C3-4 and C4-5 neural foraminal narrowing. Electronically Signed   By: Elon Alas M.D.   On: 06/14/2017 00:21   Ct Cervical Spine Wo Contrast  Result Date: 06/14/2017 CLINICAL DATA:  [place] in dark bedroom, struck dresser. No loss of consciousness. RIGHT scalp laceration. EXAM: CT HEAD WITHOUT CONTRAST CT CERVICAL SPINE WITHOUT CONTRAST TECHNIQUE: Multidetector CT imaging of  the head and cervical spine was performed following the standard protocol without intravenous contrast. Multiplanar CT image reconstructions of the cervical spine were also generated. COMPARISON:  CT HEAD April 01, 2017 FINDINGS: CT HEAD FINDINGS  BRAIN: No intraparenchymal hemorrhage, mass effect nor midline shift. The ventricles and sulci are normal for age. Patchy supratentorial white matter hypodensities less than expected for patient's age, though non-specific are most compatible with chronic small vessel ischemic disease. LEFT inferior basal ganglia perivascular space. No acute large vascular territory infarcts. No abnormal extra-axial fluid collections. Basal cisterns are patent. VASCULAR: Mild calcific atherosclerosis of the carotid siphons. SKULL: No skull fracture. Minimal RIGHT frontal scalp swelling without subcutaneous gas or radiopaque bodies. The SINUSES/ORBITS: The mastoid air-cells and included paranasal sinuses are well-aerated.The included ocular globes and orbital contents are non-suspicious. Status post bilateral ocular lens implants. OTHER: None. CT CERVICAL SPINE FINDINGS-motion degraded examination. ALIGNMENT: Straightened lordosis.  Vertebral bodies in alignment. SKULL BASE AND VERTEBRAE: Cervical vertebral bodies and posterior elements are intact. Moderate C6-7 disc height loss and endplate spurring compatible with degenerative disc. Severe RIGHT upper cervical facet arthropathy. No destructive bony lesions. C1-2 articulation maintained. SOFT TISSUES AND SPINAL CANAL: Nonacute. Mild calcific atherosclerosis carotid bifurcations. DISC LEVELS: No significant osseous canal stenosis. Moderate to severe LEFT C3-4, RIGHT C4-5 neural foraminal narrowing. UPPER CHEST: Lung apices are clear. OTHER: None. IMPRESSION: CT HEAD: 1. Negative noncontrast CT HEAD for age. CT CERVICAL SPINE: 1. No acute fracture or malalignment on this motion degraded examination. 2. Moderate to severe C3-4 and C4-5 neural foraminal narrowing. Electronically Signed   By: Elon Alas M.D.   On: 06/14/2017 00:21    EKG: Orders placed or performed during the hospital encounter of 04/01/17  . ED EKG  . ED EKG  . EKG 12-Lead  . EKG 12-Lead    IMPRESSION  AND PLAN: 84 year old elderly male patient with history of CHF, COPD, gout, renal insufficiency presented to the emergency room with fall.  Admitting diagnosis 1.  Right humerus fracture 2 accidental fall. 3.  Pelvic pain 4.  Emphysema 5.  Congestive heart failure Treatment plan Admit patient to medical floor Orthopedic surgery consultation Pelvic x-ray to rule out fracture Pain management with IV morphine Sequential compression device to lower extremities for DVT prophylaxis  All the records are reviewed and case discussed with ED provider. Management plans discussed with the patient, family and they are in agreement.  CODE STATUS:FULL CODE Code Status History    Date Active Date Inactive Code Status Order ID Comments User Context   04/01/2017 11:03 04/07/2017 16:38 Full Code [id number]  Max Sane, MD ED   10/07/2016 15:35 10/08/2016 18:31 Full Code [id number]  Max Sane, MD Inpatient   04/21/2015 15:16 04/24/2015 18:44 Full Code [id number]  Idelle Crouch, MD Inpatient       TOTAL TIME TAKING CARE OF THIS PATIENT: 50 minutes.    Saundra Shelling M.D on 06/14/2017 at 2:45 AM  Between 7am to 6pm - Pager - [phone]  After 6pm go to [email or web address] - password [email or web address] [facility]  [facility]  Office  [phone]  CC: Primary care physician; [name]., MD

## 2017-06-14 NOTE — ED Notes (Signed)
IV attempt to left antecub unsuccessful.  Spoke with MD and received order for morphine to be given IM instead of IV.

## 2017-06-15 ENCOUNTER — Telehealth: Payer: Self-pay | Admitting: Internal Medicine

## 2017-06-15 ENCOUNTER — Encounter
Admission: RE | Admit: 2017-06-15 | Discharge: 2017-06-15 | Disposition: A | Discharge status: Home / Self Care | Payer: Medicare Other | Source: Ambulatory Visit | Attending: Internal Medicine | Admitting: Internal Medicine

## 2017-06-15 ENCOUNTER — Ambulatory Visit: Payer: Self-pay | Admitting: Family Medicine

## 2017-06-15 ENCOUNTER — Inpatient Hospital Stay: Payer: Medicare Other

## 2017-06-15 LAB — BASIC METABOLIC PANEL
ANION GAP: 6 (ref 5–15)
BUN: 35 mg/dL — ABNORMAL HIGH (ref 6–20)
CALCIUM: 8.1 mg/dL — AB (ref 8.9–10.3)
CHLORIDE: 94 mmol/L — AB (ref 101–111)
CO2: 40 mmol/L — AB (ref 22–32)
Creatinine, Ser: 2.44 mg/dL — ABNORMAL HIGH (ref 0.61–1.24)
GFR calc non Af Amer: 23 mL/min — ABNORMAL LOW (ref >60)
GFR, EST AFRICAN AMERICAN: 26 mL/min — AB (ref >60)
GLUCOSE: 127 mg/dL — AB (ref 65–99)
Potassium: 4.9 mmol/L (ref 3.5–5.1)
Sodium: 140 mmol/L (ref 135–145)

## 2017-06-15 LAB — BLOOD GAS, ARTERIAL
Acid-Base Excess: 17.8 mmol/L — ABNORMAL HIGH (ref 0.0–2.0)
BICARBONATE: 45.7 mmol/L — AB (ref 20.0–28.0)
FIO2: 0.4
O2 Saturation: 85.3 %
PATIENT TEMPERATURE: 37
PO2 ART: 52 mmHg — AB (ref 83.0–108.0)
pCO2 arterial: 79 mmHg (ref 32.0–48.0)
pH, Arterial: 7.37 (ref 7.350–7.450)

## 2017-06-15 MED ORDER — DM-GUAIFENESIN ER 30-600 MG PO TB12: 1.0000 | ORAL_TABLET | Freq: Two times a day (BID) | ORAL | Status: DC | PRN

## 2017-06-15 MED ORDER — HEPARIN SODIUM (PORCINE) 5000 UNIT/ML IJ SOLN
5000.0000 [IU] | Freq: Three times a day (TID) | INTRAMUSCULAR | Status: DC
  Administered 2017-06-15 – 2017-06-16 (×4): 5000 [IU] via SUBCUTANEOUS
  Filled 2017-06-15 (×4): qty 1

## 2017-06-15 MED ORDER — GUAIFENESIN ER 600 MG PO TB12
600.0000 mg | ORAL_TABLET | Freq: Two times a day (BID) | ORAL | Status: DC | PRN
  Administered 2017-06-15: 600 mg via ORAL
  Filled 2017-06-15: qty 1

## 2017-06-15 MED ORDER — QUETIAPINE FUMARATE 25 MG PO TABS
25.0000 mg | ORAL_TABLET | Freq: Once | ORAL | Status: AC
  Administered 2017-06-15: 25 mg via ORAL
  Filled 2017-06-15: qty 1

## 2017-06-15 MED ORDER — TRAMADOL HCL 50 MG PO TABS
50.0000 mg | ORAL_TABLET | Freq: Two times a day (BID) | ORAL | Status: DC | PRN
  Administered 2017-06-15: 50 mg via ORAL
  Filled 2017-06-15: qty 1

## 2017-06-15 MED ORDER — METHYLPREDNISOLONE SODIUM SUCC 125 MG IJ SOLR
60.0000 mg | Freq: Every day | INTRAMUSCULAR | Status: DC
  Administered 2017-06-15: 60 mg via INTRAVENOUS
  Filled 2017-06-15 (×2): qty 2

## 2017-06-15 MED ORDER — SODIUM CHLORIDE 0.9 % IV SOLN
INTRAVENOUS | Status: DC
  Administered 2017-06-15: 18:00:00 via INTRAVENOUS

## 2017-06-15 MED ORDER — HYDROCODONE-ACETAMINOPHEN 5-325 MG PO TABS: 1.0000 | ORAL_TABLET | Freq: Four times a day (QID) | ORAL | Status: DC | PRN

## 2017-06-15 MED ORDER — IPRATROPIUM-ALBUTEROL 0.5-2.5 (3) MG/3ML IN SOLN
3.0000 mL | Freq: Four times a day (QID) | RESPIRATORY_TRACT | Status: DC
  Administered 2017-06-16 (×2): 3 mL via RESPIRATORY_TRACT
  Filled 2017-06-15 (×2): qty 3

## 2017-06-15 MED ORDER — IPRATROPIUM-ALBUTEROL 0.5-2.5 (3) MG/3ML IN SOLN
RESPIRATORY_TRACT | Status: AC
  Administered 2017-06-15: 17:00:00
  Filled 2017-06-15: qty 3

## 2017-06-15 MED ORDER — DEXTROMETHORPHAN POLISTIREX ER 30 MG/5ML PO SUER
30.0000 mg | Freq: Two times a day (BID) | ORAL | Status: DC | PRN
  Administered 2017-06-15: 30 mg via ORAL
  Filled 2017-06-15 (×2): qty 5

## 2017-06-15 MED ORDER — SODIUM CHLORIDE 0.9 % IV SOLN
Freq: Once | INTRAVENOUS | Status: AC
  Administered 2017-06-15: 08:00:00 via INTRAVENOUS

## 2017-06-15 NOTE — Progress Notes (Signed)
This writer alerted by spouse that "could'nt breathe." Patient continues with pursed lip breathing which is no change since admission. Staff increased to 5L. O2 drops in high 70s with PT this shift. Able to get O2 to rebound to 88%. MD made aware, new orders for Chest xray, neb treatments, solumedrol. RT drew ABGs. Monitoring continues

## 2017-06-15 NOTE — Progress Notes (Signed)
Received verbal orders from MD SPatel for SOLU-MEDROL 60 mg IV Daily. Order placed.

## 2017-06-15 NOTE — Progress Notes (Signed)
[facility] [place] [facility]   PATIENT NAME: Jack AmesGrady Jenkins    MR#:  [id number]  DATE OF BIRTH:  [birth year]  SUBJECTIVE:   Patient came in after he had a mechanical fall at home.  Started having right shoulder pain.  Found to have communicated proximal humerus fracture. Patient has chronic shortness of breath and on home oxygen about 2-3 L/min REVIEW OF SYSTEMS:   Review of Systems  Constitutional: Negative for chills, fever and weight loss.  HENT: Negative for ear discharge, ear pain and nosebleeds.   Eyes: Negative for blurred vision, pain and discharge.  Respiratory: Positive for cough and shortness of breath. Negative for sputum production, wheezing and stridor.   Cardiovascular: Negative for chest pain, palpitations, orthopnea and PND.  Gastrointestinal: Negative for abdominal pain, diarrhea, nausea and vomiting.  Genitourinary: Negative for frequency and urgency.  Musculoskeletal: Positive for falls and joint pain. Negative for back pain.  Neurological: Positive for weakness. Negative for sensory change, speech change and focal weakness.  Psychiatric/Behavioral: Negative for depression and hallucinations. The patient is not nervous/anxious.    Tolerating Diet: Yes tolerating PT: rehab  DRUG ALLERGIES:  No Known Allergies  VITALS:  Blood pressure (!) 98/48, pulse 90, temperature 97.7 F (36.5 C), temperature source Oral, resp. rate (!) 24, height 5\' 9"  (1.753 m), weight 96.2 kg (212 lb 1.6 oz), SpO2 94 %.  PHYSICAL EXAMINATION:   Physical Exam  GENERAL:  84 y.o.-year-old patient lying in the bed with no acute distress.  EYES: Pupils equal, round, reactive to light and accommodation. No scleral icterus. Extraocular muscles intact.  HEENT: Head atraumatic, normocephalic. Oropharynx and nasopharynx clear.  NECK:  Supple, no jugular venous distention. No thyroid enlargement, no tenderness.  LUNGS: Normal breath sounds bilaterally, no wheezing,  rales, rhonchi. No use of accessory muscles of respiration.  CARDIOVASCULAR: S1, S2 normal. No murmurs, rubs, or gallops.  ABDOMEN: Soft, nontender, nondistended. Bowel sounds present. No organomegaly or mass.  EXTREMITIES: No cyanosis, clubbing or edema b/l.   Right UE sling NEUROLOGIC: Cranial nerves II through XII are intact. No focal Motor or sensory deficits b/l.   PSYCHIATRIC:  patient is alert and oriented x 3.  SKIN: No obvious rash, lesion, or ulcer.   LABORATORY PANEL:  CBC Recent Labs  Lab 06/14/17 0432  WBC 11.7*  HGB 10.4*  HCT 33.6*  PLT 102*    Chemistries  Recent Labs  Lab 06/15/17 0442  NA 140  K 4.9  CL 94*  CO2 40*  GLUCOSE 127*  BUN 35*  CREATININE 2.44*  CALCIUM 8.1*   Cardiac Enzymes No results for input(s): TROPONINI in the last 168 hours. RADIOLOGY:  Dg Shoulder Right  Result Date: 06/14/2017 CLINICAL DATA:  Status post fall, striking dresser. Right shoulder pain, acute onset. Initial encounter. EXAM: RIGHT SHOULDER - 2+ VIEW COMPARISON:  Chest radiograph performed 04/02/2017 FINDINGS: There is a mildly comminuted fracture of the proximal right humeral diaphysis, with minimal displacement. Overlying soft tissue swelling is noted. The right humeral head remains seated at the glenoid fossa. Mild degenerative change is noted at the right acromioclavicular joint. The visualized portions of the right lung are clear. IMPRESSION: Mildly comminuted fracture of the proximal right humeral diaphysis, with minimal displacement. Electronically Signed   By: Roanna RaiderJeffery  Chang M.D.   On: 06/14/2017 00:32   Ct Head Wo Contrast  Result Date: 06/14/2017 CLINICAL DATA:  Larey SeatFell in dark bedroom, struck dresser. No loss of consciousness. RIGHT scalp laceration. EXAM:  CT HEAD WITHOUT CONTRAST CT CERVICAL SPINE WITHOUT CONTRAST TECHNIQUE: Multidetector CT imaging of the head and cervical spine was performed following the standard protocol without intravenous contrast. Multiplanar CT  image reconstructions of the cervical spine were also generated. COMPARISON:  CT HEAD April 01, 2017 FINDINGS: CT HEAD FINDINGS BRAIN: No intraparenchymal hemorrhage, mass effect nor midline shift. The ventricles and sulci are normal for age. Patchy supratentorial white matter hypodensities less than expected for patient's age, though non-specific are most compatible with chronic small vessel ischemic disease. LEFT inferior basal ganglia perivascular space. No acute large vascular territory infarcts. No abnormal extra-axial fluid collections. Basal cisterns are patent. VASCULAR: Mild calcific atherosclerosis of the carotid siphons. SKULL: No skull fracture. Minimal RIGHT frontal scalp swelling without subcutaneous gas or radiopaque bodies. The SINUSES/ORBITS: The mastoid air-cells and included paranasal sinuses are well-aerated.The included ocular globes and orbital contents are non-suspicious. Status post bilateral ocular lens implants. OTHER: None. CT CERVICAL SPINE FINDINGS-motion degraded examination. ALIGNMENT: Straightened lordosis.  Vertebral bodies in alignment. SKULL BASE AND VERTEBRAE: Cervical vertebral bodies and posterior elements are intact. Moderate C6-7 disc height loss and endplate spurring compatible with degenerative disc. Severe RIGHT upper cervical facet arthropathy. No destructive bony lesions. C1-2 articulation maintained. SOFT TISSUES AND SPINAL CANAL: Nonacute. Mild calcific atherosclerosis carotid bifurcations. DISC LEVELS: No significant osseous canal stenosis. Moderate to severe LEFT C3-4, RIGHT C4-5 neural foraminal narrowing. UPPER CHEST: Lung apices are clear. OTHER: None. IMPRESSION: CT HEAD: 1. Negative noncontrast CT HEAD for age. CT CERVICAL SPINE: 1. No acute fracture or malalignment on this motion degraded examination. 2. Moderate to severe C3-4 and C4-5 neural foraminal narrowing. Electronically Signed   By: Awilda Metro M.D.   On: 06/14/2017 00:21   Ct Cervical Spine  Wo Contrast  Result Date: 06/14/2017 CLINICAL DATA:  Larey Seat in dark bedroom, struck dresser. No loss of consciousness. RIGHT scalp laceration. EXAM: CT HEAD WITHOUT CONTRAST CT CERVICAL SPINE WITHOUT CONTRAST TECHNIQUE: Multidetector CT imaging of the head and cervical spine was performed following the standard protocol without intravenous contrast. Multiplanar CT image reconstructions of the cervical spine were also generated. COMPARISON:  CT HEAD April 01, 2017 FINDINGS: CT HEAD FINDINGS BRAIN: No intraparenchymal hemorrhage, mass effect nor midline shift. The ventricles and sulci are normal for age. Patchy supratentorial white matter hypodensities less than expected for patient's age, though non-specific are most compatible with chronic small vessel ischemic disease. LEFT inferior basal ganglia perivascular space. No acute large vascular territory infarcts. No abnormal extra-axial fluid collections. Basal cisterns are patent. VASCULAR: Mild calcific atherosclerosis of the carotid siphons. SKULL: No skull fracture. Minimal RIGHT frontal scalp swelling without subcutaneous gas or radiopaque bodies. The SINUSES/ORBITS: The mastoid air-cells and included paranasal sinuses are well-aerated.The included ocular globes and orbital contents are non-suspicious. Status post bilateral ocular lens implants. OTHER: None. CT CERVICAL SPINE FINDINGS-motion degraded examination. ALIGNMENT: Straightened lordosis.  Vertebral bodies in alignment. SKULL BASE AND VERTEBRAE: Cervical vertebral bodies and posterior elements are intact. Moderate C6-7 disc height loss and endplate spurring compatible with degenerative disc. Severe RIGHT upper cervical facet arthropathy. No destructive bony lesions. C1-2 articulation maintained. SOFT TISSUES AND SPINAL CANAL: Nonacute. Mild calcific atherosclerosis carotid bifurcations. DISC LEVELS: No significant osseous canal stenosis. Moderate to severe LEFT C3-4, RIGHT C4-5 neural foraminal  narrowing. UPPER CHEST: Lung apices are clear. OTHER: None. IMPRESSION: CT HEAD: 1. Negative noncontrast CT HEAD for age. CT CERVICAL SPINE: 1. No acute fracture or malalignment on this motion degraded examination. 2. Moderate to  severe C3-4 and C4-5 neural foraminal narrowing. Electronically Signed   By: Awilda Metro M.D.   On: 06/14/2017 00:21   Dg Hip Unilat W Or Wo Pelvis 2-3 Views Right  Result Date: 06/14/2017 CLINICAL DATA:  Pain after a fall EXAM: DG HIP (WITH OR WITHOUT PELVIS) 2-3V RIGHT COMPARISON:  None. FINDINGS: No fracture or dislocation is seen.  Joint space is maintained. IMPRESSION: No acute osseous abnormality Electronically Signed   By: Jasmine Pang M.D.   On: 06/14/2017 02:46   ASSESSMENT AND PLAN:  Octave Montrose  is a 84 y.o. male with a known history of congestive heart failure, COPD, gout, renal insufficiency presented to the emergency room with fall.  Patient from watching Super Bowl tried to get around the living room he lost balance and fell down on the right side of the hip and right shoulder.  Patient has pain in the right arm and right hip area.  Pain is aching in nature 8 out of 10 on a scale of 1-10.  The imaging studies showed fracture of the humerus on the right side.  1.  Right proximal humerus fracture status post mechanical fall at home -Patient has right arm sling. -Orthopedic consult placed -PRN pain meds -Physical therapy  2.  Acute hypernatremia--resolved -received IV D5 water at 75 an hour -Holding Lasix today  3. History of chronic MAI lung infection -Follows with Dr. Carolynne Edouard -We will continue ethambutol, azithromycin, rifampin Monday Wednesday Friday--- home meds  4.  History of cardiomyopathy/systolic CHF -Patient on chronic oxygen. -He currently is not in heart failure  5.  Acute on chronic kidney disease stage III -baseline Creatinine around 1.4 -1.51--1.65--D5W--2.4--ns at 50 cc/hr  6.  DVT prophylaxis subcu Lovenox  CSW for d/c  planning  Spoke with son and wife  Case discussed with Care Management/Social Worker. Management plans discussed with the patient, family and they are in agreement.  CODE STATUS: Full  DVT Prophylaxis: Lovenox Discussed with wife in the room  TOTAL TIME TAKING CARE OF THIS PATIENT: *25 mins0% time spent on counselling and coordination of care  POSSIBLE D/C IN 1-2 DAYS, DEPENDING ON CLINICAL CONDITION.  Note: This dictation was prepared with Dragon dictation along with smaller phrase technology. Any transcriptional errors that result from this process are unintentional.  Enedina Finner M.D on 06/15/2017 at 7:55 AM  Between 7am to 6pm - Pager - [phone]  After 6pm go to [email or web address] - password [email or web address]  [facility] [place] [facility]  Office  [phone]  CC: Primary care physician; Maple Hudson., MDPatient ID: Jack Jenkins, male   DOB: [birth year], 84 y.o.   MRN: [id number]

## 2017-06-15 NOTE — Progress Notes (Signed)
Patient ID: Jack Jenkins, male   DOB: [birth year], 84 y.o.   MRN: [id number] Called by RN about sats dropping earlier. CXR shows chronic changes Flutter valve, IV solumederol started. Cont nebs -pt has purse lip breathing due to COPD Check ABG--spoke with RT

## 2017-06-15 NOTE — Progress Notes (Signed)
Physical Therapy Treatment Patient Details Name: Jack Jenkins MRN: [id number] DOB: [birth year] Today's Date: 06/15/2017    History of Present Illness Pt is an87 y.o.malewith a known history of congestive heart failure, COPD, gout, renal insufficiency presented to the emergency room with fall. Patient from watching Super Bowl tried to get around the living room he lost balance and fell down on the right side of the hip and right shoulder. Patient has pain in the right arm and right hip area. Pain is aching in nature 8 out of 10 on a scale of 1-10. The imaging studies showed fracture of the humerus on the right side.  Assessment includes: R proximal humerus fracture, acute hypernatremia, h/o MAI lung infection, h/o cardiomyopathy/systolic CHF, and acute on chronic kidney disease stage III.    PT Comments    Pt was very weak but able to assist with mobility with 2 person help.  Had some limited O2 sats with 79% to 80% initially in bed on 4L O2, and up to 86% with sitting.  After standing and shifting steps he returned to bed and ran lower.  Per wife he drops with all activity to high 70's at home.  Will continue to monitor his tolerance for activity and progress gait and standing as able.  Positioned on the bed with pillows to support R shoulder posteriorly, and LE's elevated with pillows in long direction to avoid straining him.  Pt in a lot of pain from his fall at home.   Follow Up Recommendations  SNF     Equipment Recommendations  None recommended by PT;Other (comment)    Recommendations for Other Services       Precautions / Restrictions Precautions Precautions: Fall(telemetry, sling R arm) Required Braces or Orthoses: Sling Restrictions Weight Bearing Restrictions: Yes RUE Weight Bearing: Non weight bearing Other Position/Activity Restrictions: Pt may perform RUE elbow, wrist and hand ROM and remove sling for hygiene    Mobility  Bed Mobility Overal bed mobility: Needs  Assistance Bed Mobility: Supine to Sit;Sit to Supine     Supine to sit: +2 for physical assistance;Max assist Sit to supine: +2 for physical assistance;Max assist   General bed mobility comments: able to get sitting by using the bed pad and second person to assist legs as PT assisted trunk  Transfers Overall transfer level: Needs assistance Equipment used: 1 person hand held assist;Hemi-walker Transfers: Sit to/from Stand Sit to Stand: Mod assist;+2 physical assistance;+2 safety/equipment;From elevated surface         General transfer comment: able to stand mult times with dense cues and support  Ambulation/Gait Ambulation/Gait assistance: Mod assist;+2 physical assistance;+2 safety/equipment Ambulation Distance (Feet): 2 Feet Assistive device: Hemi-walker;2 person hand held assist Gait Pattern/deviations: Step-to pattern;Decreased stride length;Shuffle;Decreased weight shift to right;Wide base of support;Trunk flexed Gait velocity: reduced Gait velocity interpretation: Below normal speed for age/gender General Gait Details: able to sidestep minimally wiht support on walker and gait belt   Stairs            Wheelchair Mobility    Modified Rankin (Stroke Patients Only)       Balance Overall balance assessment: Needs assistance Sitting-balance support: Feet supported;Single extremity supported Sitting balance-Leahy Scale: Fair     Standing balance support: Single extremity supported Standing balance-Leahy Scale: Poor                              Cognition Arousal/Alertness: Awake/alert Behavior During Therapy: [facility]  for tasks assessed/performed Overall Cognitive Status: Within Functional Limits for tasks assessed                                        Exercises      General Comments General comments (skin integrity, edema, etc.): pt has deep bruising and edema on Rupper arm with sling refitted to support correctly       Pertinent Vitals/Pain Pain Assessment: 0-10 Pain Score: 8  Pain Location: R upper arm Pain Descriptors / Indicators: Tender;Sore Pain Intervention(s): Limited activity within patient's tolerance;Monitored during session;Premedicated before session;Repositioned    Home Living                      Prior Function            PT Goals (current goals can now be found in the care plan section) Acute Rehab PT Goals Patient Stated Goal: To walk better Progress towards PT goals: Progressing toward goals    Frequency    7X/week      PT Plan Current plan remains appropriate    Co-evaluation              AM-PAC PT "6 Clicks" Daily Activity  Outcome Measure  Difficulty turning over in bed (including adjusting bedclothes, sheets and blankets)?: Unable Difficulty moving from lying on back to sitting on the side of the bed? : Unable Difficulty sitting down on and standing up from a chair with arms (e.g., wheelchair, bedside commode, etc,.)?: Unable Help needed moving to and from a bed to chair (including a wheelchair)?: A Lot Help needed walking in hospital room?: A Lot Help needed climbing 3-5 steps with a railing? : Total 6 Click Score: 8    End of Session Equipment Utilized During Treatment: Oxygen;Gait belt Activity Tolerance: Patient limited by fatigue;Patient limited by pain;Other (comment);Treatment limited secondary to medical complications (Comment)(nausea bedside, low O2 sats) Patient left: in bed;with call bell/phone within reach;with bed alarm set;with family/visitor present;with nursing/sitter in room Nurse Communication: Mobility status PT Visit Diagnosis: Muscle weakness (generalized) (M62.81);Difficulty in walking, not elsewhere classified (R26.2)     Time: [id number] PT Time Calculation (min) (ACUTE ONLY): 53 min  Charges:  $Gait Training: 8-22 mins $Therapeutic Exercise: 8-22 mins $Therapeutic Activity: 8-22 mins                    G Codes:   Functional Assessment Tool Used: AM-PAC 6 Clicks Basic Mobility     Ivar DrapeRuth E Aira Sallade 06/15/2017, 4:52 PM   Samul Dadauth Gerrald Basu, PT MS Acute Rehab Dept. Number: [facility] [phone] and [facility] [phone]

## 2017-06-15 NOTE — Progress Notes (Signed)
[facility] has been received. Plan is for patient to [facility] pending medical clearance. Patient's wife Elease Hashimotoatricia is aware of above.   [employer]Bailey Alyvia Derk, LCSW [phone]

## 2017-06-15 NOTE — Telephone Encounter (Signed)
SIGNED CMN FOR OXYGEN PUT INTO AHP BOX TO BE PICKED UP.JW °

## 2017-06-15 NOTE — Progress Notes (Signed)
Spouse verbalized that pt is somewhat getting restless. Pt checked, pursed-lip breathing noted, O2 sats 90% on 4Lpm oxygen, alert but uncertain about the date and situation when asked. Dr. Caryn BeeMaier paged and ordered for one-time dose Seroquel 25mg  PO. Will administer as ordered and continue to monitor.

## 2017-06-16 ENCOUNTER — Telehealth: Payer: Self-pay | Admitting: Family Medicine

## 2017-06-16 LAB — BASIC METABOLIC PANEL
Anion gap: 9 (ref 5–15)
BUN: 43 mg/dL — AB (ref 6–20)
CALCIUM: 8.2 mg/dL — AB (ref 8.9–10.3)
CO2: 38 mmol/L — AB (ref 22–32)
CREATININE: 2.14 mg/dL — AB (ref 0.61–1.24)
Chloride: 92 mmol/L — ABNORMAL LOW (ref 101–111)
GFR calc non Af Amer: 26 mL/min — ABNORMAL LOW (ref >60)
GFR, EST AFRICAN AMERICAN: 31 mL/min — AB (ref >60)
GLUCOSE: 120 mg/dL — AB (ref 65–99)
Potassium: 5 mmol/L (ref 3.5–5.1)
Sodium: 139 mmol/L (ref 135–145)

## 2017-06-16 MED ORDER — GUAIFENESIN ER 600 MG PO TB12: 600.0000 mg | ORAL_TABLET | Freq: Two times a day (BID) | ORAL | 0 refills | Status: AC | PRN

## 2017-06-16 MED ORDER — IPRATROPIUM-ALBUTEROL 0.5-2.5 (3) MG/3ML IN SOLN: 3.0000 mL | Freq: Four times a day (QID) | RESPIRATORY_TRACT | 0 refills | Status: AC

## 2017-06-16 MED ORDER — TRAMADOL HCL 50 MG PO TABS: 50.0000 mg | ORAL_TABLET | Freq: Four times a day (QID) | ORAL | 0 refills | Status: DC | PRN

## 2017-06-16 MED ORDER — BISACODYL 10 MG RE SUPP
10.0000 mg | Freq: Once | RECTAL | Status: AC
  Administered 2017-06-16: 10 mg via RECTAL
  Filled 2017-06-16: qty 1

## 2017-06-16 MED ORDER — FUROSEMIDE 40 MG PO TABS: 40.0000 mg | ORAL_TABLET | ORAL | 0 refills | Status: AC

## 2017-06-16 MED ORDER — FUROSEMIDE 40 MG PO TABS
40.0000 mg | ORAL_TABLET | ORAL | Status: DC
  Administered 2017-06-16: 40 mg via ORAL
  Filled 2017-06-16: qty 1

## 2017-06-16 MED ORDER — PREDNISONE 10 MG PO TABS: ORAL_TABLET | ORAL | 0 refills | Status: DC

## 2017-06-16 NOTE — Progress Notes (Signed)
[facility] after calling rept to [facility] [employer] [facility]. Pt discharged via ambulance without incident per MD order. Pt accompanied by wife on discharge. No change in patient from AM assessment. Pt denies need of pain medication on discharge. Pt repts only "soreness" of right upper extremity and right lower extremity.

## 2017-06-16 NOTE — Clinical Social Work Placement (Signed)
   CLINICAL SOCIAL WORK PLACEMENT  NOTE  Date:  06/16/2017  Patient Details  Name: Jack Jenkins MRN: [id number] Date of Birth: 1931-04-18  Clinical Social Work is seeking post-discharge placement for this patient at the Skilled  Nursing Facility level of care (*CSW will initial, date and re-position this form in  chart as items are completed):  Yes   Patient/family provided with [place] Clinical Social Work Department's list of facilities offering this level of care within the geographic area requested by the patient (or if unable, by the patient's family).  Yes   Patient/family informed of their freedom to choose among providers that offer the needed level of care, that participate in Medicare, Medicaid or managed care program needed by the patient, have an available bed and are willing to accept the patient.  Yes   Patient/family informed of [place]'s ownership interest in [facility] and [facility], as well as of the fact that they are under no obligation to receive care at these facilities.  PASRR submitted to EDS on 06/14/17     PASRR number received on 06/14/17     Existing PASRR number confirmed on       FL2 transmitted to all facilities in geographic area requested by pt/family on 06/14/17     FL2 transmitted to all facilities within larger geographic area on       Patient informed that his/her managed care company has contracts with or will negotiate with certain facilities, including the following:        Yes   Patient/family informed of bed offers received.  Patient chooses bed at [facility] )     Physician recommends and patient chooses bed at      Patient to be transferred to [facility] ) on 06/16/17.  Patient to be transferred to facility by [facility] )     Patient family notified on 06/16/17 of transfer.  Name of family member notified:  (Patient's wife Jack Jenkins is aware of D/C today. )     PHYSICIAN       Additional  Comment:    _______________________________________________ Kasmira Cacioppo, Darleen CrockerBailey M, LCSW 06/16/2017, 11:22 AM

## 2017-06-16 NOTE — Care Management Important Message (Signed)
Important Message  Patient Details  Name: Jack Jenkins MRN: [id number] Date of Birth: 1930-07-26   Medicare Important Message Given:  N/A - LOS <3 / Initial given by admissions    Chapman FitchBOWEN, Alisea Matte T, RN 06/16/2017, 10:17 AM

## 2017-06-16 NOTE — Progress Notes (Signed)
Patient is medically stable for [facility]. [facility] has been received and patient can come today to room 201-B. RN will call report to [phone] and arrange EMS for transport. Clinical Child psychotherapistocial Worker (CSW) sent D/C orders to [facility] via [employer]. Patient is aware of above. CSW contacted patient's wife Elease Hashimotoatricia and made her aware of above. Please reconsult if future social work needs arise. CSW signing off.   [employer]Bailey Johanan Skorupski, LCSW [phone]

## 2017-06-16 NOTE — Telephone Encounter (Signed)
Jack Jenkins you need this one?

## 2017-06-16 NOTE — Telephone Encounter (Signed)
If he wasn't discharged to a SNF than yes. We do not complete TCM calls on SNF pts due to continued care there. Thanks for checking though! -MM

## 2017-06-16 NOTE — Progress Notes (Signed)
Rept to Dr. Allena KatzPatel. Pt removed his IV prior to scheduled solumedrol dose. MD aware of pt's K 5.0 today. No new orders at this time. Will continue to monitor.

## 2017-06-16 NOTE — Telephone Encounter (Signed)
Pt has an appointment 03-03-18 for hospital f/u

## 2017-06-16 NOTE — Discharge Summary (Addendum)
[facility] [place] [facility]   PATIENT NAME: Jack Jenkins    MR#:  [id number]  DATE OF BIRTH:  1930-11-06  DATE OF ADMISSION:  06/13/2017 ADMITTING PHYSICIAN: Jack Austin, MD  DATE OF DISCHARGE:   PRIMARY CARE PHYSICIAN: Jack Jenkins., MD    ADMISSION DIAGNOSIS:  Injury of head, initial encounter [S09.90XA] Fall, initial encounter [W19.XXXA] Laceration of scalp, initial encounter [S01.01XA] Other closed displaced fracture of proximal end of right humerus, initial encounter [S42.291A]  DISCHARGE DIAGNOSIS:   Right proximal Humerus s/p Sling Chronic severe emphysema with pulmonary fibrosis and chronic MAI--on home oxygen Right scalp laceration s/p staples placed on 06/14/17 SECONDARY DIAGNOSIS:   Past Medical History:  Diagnosis Date  . CHF (congestive heart failure) (HCC)   . COPD (chronic obstructive pulmonary disease) (HCC)   . Gout   . Renal disorder     HOSPITAL COURSE:  [name] a84 y.o.malewith a known history of congestive heart failure, COPD, gout, renal insufficiency presented to the emergency room with fall. Patient from watching [employer] tried to get around the living room he lost balance and fell down on the right side of the hip and right shoulder. Patient has pain in the right arm and right hip area. Pain is aching in nature 8 out of 10 on a scale of 1-10. The imaging studies showed fracture of the humerus on the right side.  1.  Right proximal humerus fracture status post mechanical fall at home -Patient has right arm sling. -Orthopedic consult with dr Jack Jenkins appreciated -PRN pain meds -Physical therapy recommends rehab  2.  Acute hypernatremia--resolved -received IV D5 water at 75 an hour  3. History of chronic MAI lung infection -Follows with Dr. Carolynne Jenkins -We will continue ethambutol, azithromycin, rifampin Monday Wednesday Friday--- home meds  4.  History of cardiomyopathy/systolic  CHF/chronic emphysema/Pulmonary fibrosis/chronic MAI/Chronic CO2 retainer -Patient on chronic oxygen. -He currently is not in heart failure -prednisone taper -cont nebs prn  5.  Acute on chronic kidney disease stage III -baseline Creatinine around 1.4 -1.51--1.65--D5W--2.4--ns at 50 cc/hr--2.2  6.  DVT prophylaxis subcu Lovenox  CSW for d/c planning--to [facility]  Spoke with son and wife at length about his chronic medical issues.    CONSULTS OBTAINED:  Treatment Team:  Jack Herrlich, MD  DRUG ALLERGIES:  No Known Allergies  DISCHARGE MEDICATIONS:   Allergies as of 06/16/2017   No Known Allergies     Medication List    TAKE these medications   allopurinol 100 MG tablet Commonly known as:  ZYLOPRIM Take 1 tablet (100 mg total) by mouth daily.   azithromycin 250 MG tablet Commonly known as:  ZITHROMAX Take 1 tablet by mouth 3 (three) times a week.   colchicine 0.6 MG tablet Take 1 tablet (0.6 mg total) by mouth daily.   ethambutol 400 MG tablet Commonly known as:  MYAMBUTOL Take 1,200 mg by mouth 3 (three) times a week. Monday, Wednesday, Friday   furosemide 40 MG tablet Commonly known as:  LASIX Take 1 tablet (40 mg total) by mouth every other day. Monday, Wednesday, Friday What changed:    when to take this  reasons to take this  additional instructions   guaiFENesin 600 MG 12 hr tablet Commonly known as:  MUCINEX Take 1 tablet (600 mg total) by mouth 2 (two) times daily as needed for cough.   ipratropium-albuterol 0.5-2.5 (3) MG/3ML Soln Commonly known as:  DUONEB Take 3 mLs by nebulization every  6 (six) hours.   metoprolol succinate 25 MG 24 hr tablet Commonly known as:  TOPROL XL Take 1 tablet (25 mg total) by mouth daily.   nitroGLYCERIN 0.4 MG SL tablet Commonly known as:  NITROSTAT Place 1 tablet (0.4 mg total) under the tongue every 5 (five) minutes as needed for chest pain.   predniSONE 10 MG tablet Commonly known as:   DELTASONE Take 50 mg daily and taper by 10 mg daily then stop   rifampin 300 MG capsule Commonly known as:  RIFADIN Take 600 mg by mouth 3 (three) times a week. Monday, Wednesday, Friday   traMADol 50 MG tablet Commonly known as:  ULTRAM Take 1 tablet (50 mg total) by mouth every 6 (six) hours as needed for moderate pain.       If you experience worsening of your admission symptoms, develop shortness of breath, life threatening emergency, suicidal or homicidal thoughts you must seek medical attention immediately by calling 911 or calling your MD immediately  if symptoms less severe.  You Must read complete instructions/literature along with all the possible adverse reactions/side effects for all the Medicines you take and that have been prescribed to you. Take any new Medicines after you have completely understood and accept all the possible adverse reactions/side effects.   Please note  You were cared for by a hospitalist during your hospital stay. If you have any questions about your discharge medications or the care you received while you were in the hospital after you are discharged, you can call the unit and asked to speak with the hospitalist on call if the hospitalist that took care of you is not available. Once you are discharged, your primary care physician will handle any further medical issues. Please note that NO REFILLS for any discharge medications will be authorized once you are discharged, as it is imperative that you return to your primary care physician (or establish a relationship with a primary care physician if you do not have one) for your aftercare needs so that they can reassess your need for medications and monitor your lab values. Today   SUBJECTIVE   Breathing at baseline Pleasantly confused. Per RN quiet a few visitors y'day for pt   VITAL SIGNS:  Blood pressure (!) 122/57, pulse (!) 115, temperature 98.6 F (37 C), temperature source Oral, resp. rate (!)  22, height 5\' 9"  (1.753 m), weight 96.2 kg (212 lb 1.6 oz), SpO2 95 %.  I/O:    Intake/Output Summary (Last 24 hours) at 06/16/2017 0945 Last data filed at 06/16/2017 0400 Gross per 24 hour  Intake 1357.34 ml  Output 325 ml  Net 1032.34 ml    PHYSICAL EXAMINATION:  GENERAL:  84 y.o.-year-old patient lying in the bed with no acute distress. Appears chronically ill EYES: Pupils equal, round, reactive to light and accommodation. No scleral icterus. Extraocular muscles intact.  HEENT: Head atraumatic, normocephalic. Oropharynx and nasopharynx clear.  NECK:  Supple, no jugular venous distention. No thyroid enlargement, no tenderness.  LUNGS: coarse breath sounds bilaterally, no wheezing, rales,rhonchi or crepitation. No use of accessory muscles of respiration. Purse lip breathing CARDIOVASCULAR: S1, S2 normal. No murmurs, rubs, or gallops.  ABDOMEN: Soft, non-tender, non-distended. Bowel sounds present. No organomegaly or mass.  EXTREMITIES: No pedal edema, cyanosis, or clubbing.  NEUROLOGIC: Cranial nerves II through XII are intact. Muscle strength 5/5 in all extremities. Sensation intact. Gait not checked.  PSYCHIATRIC: The patient is alert and awake SKIN: No obvious rash, lesion, or ulcer.  DATA REVIEW:   CBC  Recent Labs  Lab 06/14/17 0432  WBC 11.7*  HGB 10.4*  HCT 33.6*  PLT 102*    Chemistries  Recent Labs  Lab 06/16/17 0309  NA 139  K 5.0  CL 92*  CO2 38*  GLUCOSE 120*  BUN 43*  CREATININE 2.14*  CALCIUM 8.2*    Microbiology Results   No results found for this or any previous visit (from the past 240 hour(s)).  RADIOLOGY:  Dg Chest 1 View  Result Date: 06/15/2017 CLINICAL DATA:  Wheezing, fall, humeral fracture EXAM: CHEST 1 VIEW COMPARISON:  Portable exam 1733 hours compared to 04/02/2017 FINDINGS: Enlargement of cardiac silhouette with pulmonary vascular congestion. Low lung volumes with bibasilar atelectasis greater on LEFT. Chronic nodular density  projects over the upper to mid LEFT lung, corresponding to an area of nodular thickening/scarring at the major fissure on a prior CT. No definite infiltrate, pleural effusion or pneumothorax. Diffuse osseous demineralization. IMPRESSION: Enlargement of cardiac silhouette with pulmonary vascular congestion. Bibasilar atelectasis greater on LEFT. Stable nodular density at upper to mid LEFT lung. Electronically Signed   By: Ulyses SouthwardMark  Boles M.D.   On: 06/15/2017 17:47     Management plans discussed with the patient, family and they are in agreement.  CODE STATUS:     Code Status Orders  (From admission, onward)        Start     Ordered   06/14/17 0304  Full code  Continuous     06/14/17 0303    Code Status History    Date Active Date Inactive Code Status Order ID Comments User Context   04/01/2017 11:03 04/07/2017 16:38 Full Code [id number]  Delfino LovettShah, Vipul, MD ED   10/07/2016 15:35 10/08/2016 18:31 Full Code [id number]  Delfino LovettShah, Vipul, MD Inpatient   04/21/2015 15:16 04/24/2015 18:44 Full Code [id number]  Marguarite ArbourSparks, Jeffrey D, MD Inpatient    Advance Directive Documentation     Most Recent Value  Type of Advance Directive  Healthcare Power of Attorney, Living will  Pre-existing out of facility DNR order (yellow form or pink MOST form)  No data  "MOST" Form in Place?  No data      TOTAL TIME TAKING CARE OF THIS PATIENT: *40* minutes.    Enedina FinnerSona Cortny Bambach M.D on 06/16/2017 at 9:45 AM  Between 7am to 6pm - Pager - [phone] After 6pm go to [email or web address] - Social research officer, [email or web address] [email or web address]  [facility]  Office  [phone]  CC: Primary care physician; Jack HudsonGilbert, Richard L Jr., MD

## 2017-06-17 ENCOUNTER — Encounter: Payer: Self-pay | Admitting: Gerontology

## 2017-06-17 ENCOUNTER — Inpatient Hospital Stay
Admission: EM | Admit: 2017-06-17 | Discharge: 2017-06-22 | DRG: 193 | Disposition: A | Discharge status: Skilled Nursing Facility | Payer: Medicare Other | Attending: Family Medicine | Admitting: Family Medicine

## 2017-06-17 ENCOUNTER — Emergency Department: Payer: Medicare Other

## 2017-06-17 ENCOUNTER — Other Ambulatory Visit: Payer: Self-pay

## 2017-06-17 ENCOUNTER — Encounter: Payer: Self-pay | Admitting: *Deleted

## 2017-06-17 ENCOUNTER — Non-Acute Institutional Stay (SKILLED_NURSING_FACILITY): Payer: Medicare Other | Admitting: Gerontology

## 2017-06-17 DIAGNOSIS — Z9842 Cataract extraction status, left eye: Secondary | ICD-10-CM

## 2017-06-17 DIAGNOSIS — E782 Mixed hyperlipidemia: Secondary | ICD-10-CM | POA: Diagnosis present

## 2017-06-17 DIAGNOSIS — Z8701 Personal history of pneumonia (recurrent): Secondary | ICD-10-CM

## 2017-06-17 DIAGNOSIS — J440 Chronic obstructive pulmonary disease with acute lower respiratory infection: Secondary | ICD-10-CM | POA: Diagnosis present

## 2017-06-17 DIAGNOSIS — Z9981 Dependence on supplemental oxygen: Secondary | ICD-10-CM

## 2017-06-17 DIAGNOSIS — J9811 Atelectasis: Secondary | ICD-10-CM | POA: Diagnosis present

## 2017-06-17 DIAGNOSIS — Z801 Family history of malignant neoplasm of trachea, bronchus and lung: Secondary | ICD-10-CM

## 2017-06-17 DIAGNOSIS — Z86010 Personal history of colonic polyps: Secondary | ICD-10-CM | POA: Diagnosis not present

## 2017-06-17 DIAGNOSIS — J8410 Pulmonary fibrosis, unspecified: Secondary | ICD-10-CM | POA: Diagnosis present

## 2017-06-17 DIAGNOSIS — I5032 Chronic diastolic (congestive) heart failure: Secondary | ICD-10-CM | POA: Diagnosis present

## 2017-06-17 DIAGNOSIS — D631 Anemia in chronic kidney disease: Secondary | ICD-10-CM | POA: Diagnosis present

## 2017-06-17 DIAGNOSIS — E875 Hyperkalemia: Secondary | ICD-10-CM | POA: Diagnosis present

## 2017-06-17 DIAGNOSIS — Z8249 Family history of ischemic heart disease and other diseases of the circulatory system: Secondary | ICD-10-CM | POA: Diagnosis not present

## 2017-06-17 DIAGNOSIS — R0902 Hypoxemia: Secondary | ICD-10-CM | POA: Diagnosis present

## 2017-06-17 DIAGNOSIS — N184 Chronic kidney disease, stage 4 (severe): Secondary | ICD-10-CM | POA: Diagnosis present

## 2017-06-17 DIAGNOSIS — I495 Sick sinus syndrome: Secondary | ICD-10-CM | POA: Diagnosis present

## 2017-06-17 DIAGNOSIS — S42291D Other displaced fracture of upper end of right humerus, subsequent encounter for fracture with routine healing: Secondary | ICD-10-CM

## 2017-06-17 DIAGNOSIS — J9600 Acute respiratory failure, unspecified whether with hypoxia or hypercapnia: Secondary | ICD-10-CM

## 2017-06-17 DIAGNOSIS — Y95 Nosocomial condition: Secondary | ICD-10-CM | POA: Diagnosis present

## 2017-06-17 DIAGNOSIS — J9621 Acute and chronic respiratory failure with hypoxia: Secondary | ICD-10-CM | POA: Diagnosis present

## 2017-06-17 DIAGNOSIS — J189 Pneumonia, unspecified organism: Secondary | ICD-10-CM | POA: Diagnosis present

## 2017-06-17 DIAGNOSIS — I248 Other forms of acute ischemic heart disease: Secondary | ICD-10-CM | POA: Diagnosis present

## 2017-06-17 DIAGNOSIS — I130 Hypertensive heart and chronic kidney disease with heart failure and stage 1 through stage 4 chronic kidney disease, or unspecified chronic kidney disease: Secondary | ICD-10-CM | POA: Diagnosis present

## 2017-06-17 DIAGNOSIS — Z9841 Cataract extraction status, right eye: Secondary | ICD-10-CM | POA: Diagnosis not present

## 2017-06-17 DIAGNOSIS — E860 Dehydration: Secondary | ICD-10-CM | POA: Diagnosis present

## 2017-06-17 DIAGNOSIS — E861 Hypovolemia: Secondary | ICD-10-CM | POA: Diagnosis present

## 2017-06-17 LAB — CBC WITH DIFFERENTIAL/PLATELET
BASOS ABS: 0 10*3/uL (ref 0–0.1)
BASOS PCT: 0 %
EOS ABS: 0 10*3/uL (ref 0–0.7)
EOS PCT: 0 %
HCT: 27.8 % — ABNORMAL LOW (ref 40.0–52.0)
Hemoglobin: 9 g/dL — ABNORMAL LOW (ref 13.0–18.0)
Lymphocytes Relative: 5 %
Lymphs Abs: 0.5 10*3/uL — ABNORMAL LOW (ref 1.0–3.6)
MCH: 30.8 pg (ref 26.0–34.0)
MCHC: 32.4 g/dL (ref 32.0–36.0)
MCV: 95.1 fL (ref 80.0–100.0)
MONO ABS: 0.9 10*3/uL (ref 0.2–1.0)
Monocytes Relative: 11 %
Neutro Abs: 7 10*3/uL — ABNORMAL HIGH (ref 1.4–6.5)
Neutrophils Relative %: 84 %
PLATELETS: 138 10*3/uL — AB (ref 150–440)
RBC: 2.92 MIL/uL — ABNORMAL LOW (ref 4.40–5.90)
RDW: 15.8 % — AB (ref 11.5–14.5)
WBC: 8.5 10*3/uL (ref 3.8–10.6)

## 2017-06-17 LAB — BASIC METABOLIC PANEL
ANION GAP: 9 (ref 5–15)
BUN: 59 mg/dL — ABNORMAL HIGH (ref 6–20)
CALCIUM: 8.5 mg/dL — AB (ref 8.9–10.3)
CO2: 39 mmol/L — ABNORMAL HIGH (ref 22–32)
Chloride: 92 mmol/L — ABNORMAL LOW (ref 101–111)
Creatinine, Ser: 2.21 mg/dL — ABNORMAL HIGH (ref 0.61–1.24)
GFR, EST AFRICAN AMERICAN: 30 mL/min — AB (ref >60)
GFR, EST NON AFRICAN AMERICAN: 25 mL/min — AB (ref >60)
GLUCOSE: 138 mg/dL — AB (ref 65–99)
Potassium: 5.4 mmol/L — ABNORMAL HIGH (ref 3.5–5.1)
SODIUM: 140 mmol/L (ref 135–145)

## 2017-06-17 LAB — INFLUENZA PANEL BY PCR (TYPE A & B)
INFLBPCR: NEGATIVE
Influenza A By PCR: NEGATIVE

## 2017-06-17 LAB — PROTIME-INR
INR: 1.44
Prothrombin Time: 17.4 seconds — ABNORMAL HIGH (ref 11.4–15.2)

## 2017-06-17 LAB — TROPONIN I
TROPONIN I: 0.27 ng/mL — AB (ref <0.03)
Troponin I: 0.28 ng/mL (ref <0.03)

## 2017-06-17 LAB — FIBRIN DERIVATIVES D-DIMER (ARMC ONLY): FIBRIN DERIVATIVES D-DIMER (ARMC): 5767.38 ng{FEU}/mL — AB (ref 0.00–499.00)

## 2017-06-17 LAB — APTT: APTT: 28 s (ref 24–36)

## 2017-06-17 LAB — LACTIC ACID, PLASMA: Lactic Acid, Venous: 1.4 mmol/L (ref 0.5–1.9)

## 2017-06-17 MED ORDER — SODIUM CHLORIDE 0.9 % IV SOLN: 250.0000 mL | INTRAVENOUS | Status: DC | PRN

## 2017-06-17 MED ORDER — SODIUM CHLORIDE 0.9 % IV SOLN
INTRAVENOUS | Status: DC
  Administered 2017-06-17: 21:00:00 via INTRAVENOUS

## 2017-06-17 MED ORDER — ONDANSETRON HCL 4 MG/2ML IJ SOLN: 4.0000 mg | Freq: Four times a day (QID) | INTRAMUSCULAR | Status: DC | PRN

## 2017-06-17 MED ORDER — SENNOSIDES-DOCUSATE SODIUM 8.6-50 MG PO TABS: 1.0000 | ORAL_TABLET | Freq: Every evening | ORAL | Status: DC | PRN

## 2017-06-17 MED ORDER — GUAIFENESIN ER 600 MG PO TB12: 600.0000 mg | ORAL_TABLET | Freq: Two times a day (BID) | ORAL | Status: DC | PRN

## 2017-06-17 MED ORDER — SODIUM CHLORIDE 0.9% FLUSH
3.0000 mL | Freq: Two times a day (BID) | INTRAVENOUS | Status: DC
  Administered 2017-06-18 – 2017-06-21 (×7): 3 mL via INTRAVENOUS

## 2017-06-17 MED ORDER — ACETAMINOPHEN 650 MG RE SUPP: 650.0000 mg | Freq: Four times a day (QID) | RECTAL | Status: DC | PRN

## 2017-06-17 MED ORDER — SODIUM CHLORIDE 0.9% FLUSH: 3.0000 mL | INTRAVENOUS | Status: DC | PRN

## 2017-06-17 MED ORDER — TRAMADOL HCL 50 MG PO TABS: 50.0000 mg | ORAL_TABLET | Freq: Four times a day (QID) | ORAL | 0 refills | Status: DC | PRN

## 2017-06-17 MED ORDER — RIFAMPIN 300 MG PO CAPS
600.0000 mg | ORAL_CAPSULE | ORAL | Status: DC
  Administered 2017-06-18 – 2017-06-21 (×2): 600 mg via ORAL
  Filled 2017-06-17 (×2): qty 2

## 2017-06-17 MED ORDER — ACETAMINOPHEN 325 MG PO TABS: 650.0000 mg | ORAL_TABLET | Freq: Four times a day (QID) | ORAL | Status: DC | PRN

## 2017-06-17 MED ORDER — DEXTROSE 5 % IV SOLN
2.0000 g | INTRAVENOUS | Status: DC
  Administered 2017-06-18 – 2017-06-20 (×3): 2 g via INTRAVENOUS
  Filled 2017-06-17 (×4): qty 2

## 2017-06-17 MED ORDER — AZITHROMYCIN 250 MG PO TABS
250.0000 mg | ORAL_TABLET | ORAL | Status: DC
  Administered 2017-06-18 – 2017-06-21 (×2): 250 mg via ORAL
  Filled 2017-06-17 (×2): qty 1

## 2017-06-17 MED ORDER — ALLOPURINOL 100 MG PO TABS
100.0000 mg | ORAL_TABLET | Freq: Every day | ORAL | Status: DC
  Administered 2017-06-18 – 2017-06-22 (×5): 100 mg via ORAL
  Filled 2017-06-17 (×5): qty 1

## 2017-06-17 MED ORDER — HEPARIN BOLUS VIA INFUSION
4000.0000 [IU] | Freq: Once | INTRAVENOUS | Status: AC
  Administered 2017-06-17: 4000 [IU] via INTRAVENOUS
  Filled 2017-06-17: qty 4000

## 2017-06-17 MED ORDER — COLCHICINE 0.6 MG PO TABS
0.6000 mg | ORAL_TABLET | Freq: Every day | ORAL | Status: DC
  Administered 2017-06-18 – 2017-06-22 (×5): 0.6 mg via ORAL
  Filled 2017-06-17 (×5): qty 1

## 2017-06-17 MED ORDER — VANCOMYCIN HCL IN DEXTROSE 1-5 GM/200ML-% IV SOLN
1000.0000 mg | INTRAVENOUS | Status: DC
  Administered 2017-06-18 – 2017-06-19 (×3): 1000 mg via INTRAVENOUS
  Filled 2017-06-17 (×4): qty 200

## 2017-06-17 MED ORDER — HYDROCODONE-ACETAMINOPHEN 5-325 MG PO TABS
1.0000 | ORAL_TABLET | ORAL | Status: DC | PRN
  Administered 2017-06-20: 1 via ORAL
  Administered 2017-06-21: 2 via ORAL
  Administered 2017-06-21: 1 via ORAL
  Filled 2017-06-17 (×2): qty 1
  Filled 2017-06-17: qty 2

## 2017-06-17 MED ORDER — IPRATROPIUM-ALBUTEROL 0.5-2.5 (3) MG/3ML IN SOLN
3.0000 mL | Freq: Four times a day (QID) | RESPIRATORY_TRACT | Status: DC
  Administered 2017-06-18 – 2017-06-19 (×5): 3 mL via RESPIRATORY_TRACT
  Filled 2017-06-17 (×6): qty 3

## 2017-06-17 MED ORDER — DEXTROSE 5 % IV SOLN
2.0000 g | Freq: Once | INTRAVENOUS | Status: AC
  Administered 2017-06-17: 2 g via INTRAVENOUS
  Filled 2017-06-17: qty 2

## 2017-06-17 MED ORDER — ALBUTEROL SULFATE (2.5 MG/3ML) 0.083% IN NEBU
2.5000 mg | INHALATION_SOLUTION | RESPIRATORY_TRACT | Status: DC | PRN
  Administered 2017-06-18: 2.5 mg via RESPIRATORY_TRACT
  Filled 2017-06-17: qty 3

## 2017-06-17 MED ORDER — NITROGLYCERIN 0.4 MG SL SUBL: 0.4000 mg | SUBLINGUAL_TABLET | SUBLINGUAL | Status: DC | PRN

## 2017-06-17 MED ORDER — BISACODYL 5 MG PO TBEC: 5.0000 mg | DELAYED_RELEASE_TABLET | Freq: Every day | ORAL | Status: DC | PRN

## 2017-06-17 MED ORDER — VITAMIN D 1000 UNITS PO TABS
4000.0000 [IU] | ORAL_TABLET | Freq: Every day | ORAL | Status: DC
  Administered 2017-06-18 – 2017-06-22 (×5): 4000 [IU] via ORAL
  Filled 2017-06-17 (×5): qty 4

## 2017-06-17 MED ORDER — ONDANSETRON HCL 4 MG PO TABS: 4.0000 mg | ORAL_TABLET | Freq: Four times a day (QID) | ORAL | Status: DC | PRN

## 2017-06-17 MED ORDER — METOPROLOL SUCCINATE ER 25 MG PO TB24
25.0000 mg | ORAL_TABLET | Freq: Every day | ORAL | Status: DC
  Administered 2017-06-18: 25 mg via ORAL
  Filled 2017-06-17: qty 1

## 2017-06-17 MED ORDER — FUROSEMIDE 40 MG PO TABS
40.0000 mg | ORAL_TABLET | ORAL | Status: DC
  Administered 2017-06-18: 40 mg via ORAL
  Filled 2017-06-17: qty 1

## 2017-06-17 MED ORDER — FUROSEMIDE 40 MG PO TABS: 40.0000 mg | ORAL_TABLET | ORAL | Status: DC

## 2017-06-17 MED ORDER — HEPARIN (PORCINE) IN NACL 100-0.45 UNIT/ML-% IJ SOLN
1200.0000 [IU]/h | INTRAMUSCULAR | Status: DC
  Administered 2017-06-17 – 2017-06-18 (×2): 1300 [IU]/h via INTRAVENOUS
  Filled 2017-06-17 (×3): qty 250

## 2017-06-17 MED ORDER — VANCOMYCIN HCL IN DEXTROSE 1-5 GM/200ML-% IV SOLN
1000.0000 mg | Freq: Once | INTRAVENOUS | Status: AC
  Administered 2017-06-17: 1000 mg via INTRAVENOUS
  Filled 2017-06-17: qty 200

## 2017-06-17 NOTE — Progress Notes (Signed)
ANTICOAGULATION CONSULT NOTE - Initial Consult  Pharmacy Consult for heparin gtt Indication: pulmonary embolus  No Known Allergies  Patient Measurements:   Heparin Dosing Weight: 90.5kg  Vital Signs: Temp: 98.3 F (36.8 C) (02/07 1455) Temp Source: Oral (02/07 1455) BP: 109/84 (02/07 1455) Pulse Rate: 106 (02/07 1455)  Labs: Recent Labs    06/15/17 0442 06/16/17 0309 06/17/17 1449  HGB  --   --  9.0*  HCT  --   --  27.8*  PLT  --   --  138*  CREATININE 2.44* 2.14* 2.21*  TROPONINI  --   --  0.28*    Estimated Creatinine Clearance: 27.8 mL/min (A) (by C-G formula based on SCr of 2.21 mg/dL (H)).   Medical History: Past Medical History:  Diagnosis Date  . CHF (congestive heart failure) (HCC)   . CKD (chronic kidney disease)   . COPD (chronic obstructive pulmonary disease) (HCC)   . Gout   . Hypertension   . Osteoarthritis   . Renal disorder     Medications:   (Not in a hospital admission) Scheduled:  . heparin  4,000 Units Intravenous Once   Infusions:  . ceFEPime (MAXIPIME) IV    . heparin    . vancomycin     PRN:  Anti-infectives (From admission, onward)   Start     Dose/Rate Route Frequency Ordered Stop   06/17/17 1745  ceFEPIme (MAXIPIME) 2 g in dextrose 5 % 50 mL IVPB     2 g 100 mL/hr over 30 Minutes Intravenous  Once 06/17/17 1736     06/17/17 1745  vancomycin (VANCOCIN) IVPB 1000 mg/200 mL premix     1,000 mg 200 mL/hr over 60 Minutes Intravenous  Once 06/17/17 1736        Assessment: 84 year old man with PE requiring heparin anticoagulation per pharmacy.   Goal of Therapy:  Heparin level 0.3-0.7 units/ml Monitor platelets by anticoagulation protocol: Yes   Plan:  Give 4000 units bolus x 1 Start heparin infusion at 1300 units/hr Check anti-Xa level in 8 hours and daily while on heparin Continue to monitor H&H and platelets  Gerre PebblesGarrett Leiann Sporer 06/17/2017,5:47 PM

## 2017-06-17 NOTE — Telephone Encounter (Signed)
Rx sent to [facility] phone : [phone] , fax : [phone]  

## 2017-06-17 NOTE — ED Notes (Signed)
Family updated on bed assignment and delay in calling report. Multiple family members at bedside to see pt before transport.

## 2017-06-17 NOTE — ED Triage Notes (Signed)
[facility] staff reports pt had an oxygen saturation of 50% and  A systolic BP of 70 . EMS reports they arrived and pt had an oxygen saturation of 89% and BP of 110/68. PT arrived to ED and is 70% on RA. Family reports pt is on 4L at baseline. Pt is now on 4L and has a saturation of 98%.   Pt in hospital for a broken right humerus and just discharged yesterday. Sling remains in place on right arm.

## 2017-06-17 NOTE — Progress Notes (Signed)
Location:   [facility] [place] [facility] [place] Room Number: 201B Place of Service:  SNF [id number]) Provider:  Toni Arthurs, NP-C  [name]., MD  Patient Care Team: [name]., MD as PCP - General [facility])  Extended Emergency Contact Information Primary Emergency Contact: Osterlund,Patricia H Address: [address]          [place], [place] [address] Johnnette Litter of [place] Phone: [phone] Mobile Phone: [phone] Relation: Spouse Secondary Emergency Contact: Dowis,Keith Address: [address]          [place], [place] [address] Johnnette Litter of [place] Phone: [phone] Mobile Phone: [phone] Relation: Son  Code Status:  FULL Goals of care: Advanced Directive information Advanced Directives 06/17/2017  Does Patient Have a Medical Advance Directive? No  Type of Advance Directive -  Does patient want to make changes to medical advance directive? -  Copy of [place] in Chart? -  Would patient like information on creating a medical advance directive? -     Chief Complaint  Patient presents with  . Acute Visit    Recheck respiratory distress    HPI:  Pt is a 84 y.o. male seen today for an acute visit for acute respiratory distress. I was called urgently to the room by nursing to assess pt as he was found to have O2 sats of 54% on 4 liters [place]. Pt was diaphoretic with decreased LOC. Tachypnea- pulse 108. Hypotensive B/P 77/42. Pt place on NRB with Oxygen going at 10 L/min. O2 sats improved to 92%. LOC improved- pt confused/ disoriented. Fingers were cyanotic, cool. EMS notified. Pt was transported to the ED. Pt denied chest pain/ tightness, prior to leaving. Vitals improved and were stable prior to transport. Pt did become restless/ agitated with NRB. Nasal Canula returned with O2 flow on 6 liters. Report given to Fire and EMS. Wife and son at bedside.     Past Medical History:  Diagnosis Date  . CHF (congestive  heart failure) ([place])   . CKD (chronic kidney disease)   . COPD (chronic obstructive pulmonary disease) ([place])   . Gout   . Hypertension   . Osteoarthritis   . Renal disorder    Past Surgical History:  Procedure Laterality Date  . BACK SURGERY    . CATARACT EXTRACTION Bilateral   . CHOLECYSTECTOMY    . FLEXIBLE BRONCHOSCOPY N/A 02/09/2017   Procedure: FLEXIBLE BRONCHOSCOPY;  Surgeon: Allyne Gee, MD;  Location: [facility] ORS;  Service: Pulmonary;  Laterality: N/A;  . HERNIA REPAIR    . INGUINAL HERNIA REPAIR    . LAMINECTOMY    . POLYPECTOMY     colon  . UPPER GI ENDOSCOPY  01/15/04    No Known Allergies  Allergies as of 06/17/2017   No Known Allergies     Medication List        Accurate as of 06/17/17  2:34 PM. Always use your most recent med list.          acetaminophen 325 MG tablet Commonly known as:  TYLENOL Take 650 mg by mouth every 6 (six) hours as needed. Pain or increased fever.May be administered orally, per G-tube if needed or rectally if unable to swallow (separate order). Maximum dose for 24 hours is 3,000 mg from all sources of Acetaminophen/ Tylenol   allopurinol 100 MG tablet Commonly known as:  ZYLOPRIM Take 1 tablet (100 mg total) by mouth daily.   azithromycin 250 MG  tablet Commonly known as:  ZITHROMAX Take 250 mg by mouth 3 (three) times a week. Take 1 tablet On Monday Wednesday and Friday   Cholecalciferol 4000 units Caps Take 1 capsule by mouth daily.   colchicine 0.6 MG tablet Take 1 tablet (0.6 mg total) by mouth daily.   furosemide 40 MG tablet Commonly known as:  LASIX Take 1 tablet (40 mg total) by mouth every other day. Monday, Wednesday, Friday   guaiFENesin 600 MG 12 hr tablet Commonly known as:  MUCINEX Take 1 tablet (600 mg total) by mouth 2 (two) times daily as needed for cough.   ipratropium-albuterol 0.5-2.5 (3) MG/3ML Soln Commonly known as:  DUONEB Take 3 mLs by nebulization every 6 (six) hours.   metoprolol succinate 25  MG 24 hr tablet Commonly known as:  TOPROL XL Take 1 tablet (25 mg total) by mouth daily.   nitroGLYCERIN 0.4 MG SL tablet Commonly known as:  NITROSTAT Place 1 tablet (0.4 mg total) under the tongue every 5 (five) minutes as needed for chest pain.   rifampin 300 MG capsule Commonly known as:  RIFADIN Take 600 mg by mouth 3 (three) times a week. On Monday Wednesday and Friday   traMADol 50 MG tablet Commonly known as:  ULTRAM Take 1 tablet (50 mg total) by mouth every 6 (six) hours as needed for moderate pain.       Review of Systems  Unable to perform ROS: Mental status change  Constitutional: Positive for activity change, diaphoresis and fatigue. Negative for appetite change, chills and fever.  Respiratory: Positive for shortness of breath. Negative for apnea, cough, choking, chest tightness and wheezing.   Cardiovascular: Negative for chest pain, palpitations and leg swelling.  Musculoskeletal: Positive for arthralgias (typical arthritis) and joint swelling (humerus fracture). Negative for back pain, gait problem and myalgias.  Skin: Positive for color change and pallor.  Psychiatric/Behavioral: Positive for agitation and confusion. Negative for behavioral problems.  All other systems reviewed and are negative.   Immunization History  Administered Date(s) Administered  . Influenza, High Dose Seasonal PF 04/13/2017  . Pneumococcal Conjugate-13 09/15/2013  . Pneumococcal Polysaccharide-23 04/13/2017  . Td 06/04/2004   Pertinent  Health Maintenance Due  Topic Date Due  . INFLUENZA VACCINE  Completed  . PNA vac Low Risk Adult  Completed   Fall Risk  06/07/2017 10/20/2016 12/31/2014  Falls in the past year? No No No   Functional Status Survey:    Vitals:   06/17/17 1419  BP: 108/60  Pulse: (!) 129  Resp: 20  Temp: 98.5 F (36.9 C)  TempSrc: Oral  SpO2: 96%  Weight: 210 lb 3.2 oz (95.3 kg)  Height: _0  (1.753 m)   Body mass index is 31.04 kg/m. Physical Exam    Constitutional: He is oriented to person, place, and time. He appears well-developed and well-nourished. He has a sickly appearance. He appears distressed. Nasal cannula and face mask in place.  HENT:  Head: Normocephalic and atraumatic.  Mouth/Throat: Uvula is midline, oropharynx is clear and moist and mucous membranes are normal. Mucous membranes are not pale, not dry and not cyanotic.  Eyes: Conjunctivae, EOM and lids are normal. Pupils are equal, round, and reactive to light.  Neck: Trachea normal, normal range of motion and full passive range of motion without pain. Neck supple. No JVD present. No tracheal deviation, no edema and no erythema present. No thyromegaly present.  Cardiovascular: Normal heart sounds, intact distal pulses and normal pulses. An irregular  rhythm present. Tachycardia present. Exam reveals no gallop, no distant heart sounds and no friction rub.  No murmur heard. Pulses:      Dorsalis pedis pulses are 2+ on the right side, and 2+ on the left side.  No edema  Pulmonary/Chest: Accessory muscle usage present. Tachypnea noted. He is in respiratory distress. He has decreased breath sounds in the right lower field and the left lower field. He has no wheezes. He has no rhonchi. He has no rales. He exhibits no tenderness.  Abdominal: Soft. Normal appearance and bowel sounds are normal. He exhibits no distension and no ascites. There is no tenderness.  Musculoskeletal: Normal range of motion. He exhibits no tenderness.       Right upper arm: He exhibits swelling, edema and deformity.  Expected osteoarthritis, stiffness; Bilateral Calves soft, supple. Negative Homan's Sign. B- pedal pulses equal. B- radial pulses equal.   Neurological: He is oriented to person, place, and time. He has normal strength. He is unresponsive. A cranial nerve deficit and sensory deficit is present.  Skin: Skin is warm and intact. Bruising (Right humerus) noted. He is diaphoretic. There is cyanosis  (fingers). There is pallor. Nails show no clubbing.  Psychiatric: He has a normal mood and affect. His speech is normal and behavior is normal. Judgment and thought content normal. Cognition and memory are normal.  Nursing note and vitals reviewed.   Labs reviewed: Recent Labs    04/05/17 0504  06/14/17 0432 06/15/17 0442 06/16/17 0309  NA 143   < > 151* 140 139  K 4.2   < > 4.5 4.9 5.0  CL 105   < > 100* 94* 92*  CO2 35*   < > 40* 40* 38*  GLUCOSE 93   < > 154* 127* 120*  BUN 24*   < > 25* 35* 43*  CREATININE 1.31*   < > 1.65* 2.44* 2.14*  CALCIUM 8.2*   < > 8.8* 8.1* 8.2*  MG 2.5*  --   --   --   --   PHOS 4.5  --   --   --   --    < > = values in this interval not displayed.   Recent Labs    04/06/17 0550 04/07/17 0404 04/13/17 0921 06/07/17 1508  AST 54* 34 19 23  ALT 37 _0 ALKPHOS 68 71  --  87  BILITOT 1.1 0.6 0.5 0.2  PROT 5.8* 5.6* 5.7* 6.0  ALBUMIN 2.9* 2.6*  --  3.6   Recent Labs    04/07/17 0404 04/13/17 0921 06/07/17 1508 06/13/17 2347 06/14/17 0432  WBC 6.7 7.1 5.3 4.9 11.7*  NEUTROABS 5.8 5,552 3.1  --   --   HGB 10.6* 10.8* 11.4* 11.2* 10.4*  HCT 32.8* 33.7* 37.3* 35.8* 33.6*  MCV 96.9 95.5 99* 98.9 98.8  PLT 102* 210 147* 109* 102*   Lab Results  Component Value Date   TSH 1.804 04/01/2017   Lab Results  Component Value Date   HGBA1C 5.1 04/21/2015   Lab Results  Component Value Date   CHOL 144 09/18/2013   HDL 50 09/18/2013   LDLCALC 81 09/18/2013   TRIG 64 09/18/2013    Significant Diagnostic Results in last 30 days:  Dg Chest 1 View  Result Date: 06/15/2017 CLINICAL DATA:  Wheezing, fall, humeral fracture EXAM: CHEST 1 VIEW COMPARISON:  Portable exam 1733 hours compared to 04/02/2017 FINDINGS: Enlargement of cardiac silhouette with pulmonary vascular congestion. Low lung  volumes with bibasilar atelectasis greater on LEFT. Chronic nodular density projects over the upper to mid LEFT lung, corresponding to an area of nodular  thickening/scarring at the major fissure on a prior CT. No definite infiltrate, pleural effusion or pneumothorax. Diffuse osseous demineralization. IMPRESSION: Enlargement of cardiac silhouette with pulmonary vascular congestion. Bibasilar atelectasis greater on LEFT. Stable nodular density at upper to mid LEFT lung. Electronically Signed   By: Lavonia Dana M.D.   On: 06/15/2017 17:47   Dg Shoulder Right  Result Date: 06/14/2017 CLINICAL DATA:  Status post fall, striking dresser. Right shoulder pain, acute onset. Initial encounter. EXAM: RIGHT SHOULDER - 2+ VIEW COMPARISON:  Chest radiograph performed 04/02/2017 FINDINGS: There is a mildly comminuted fracture of the proximal right humeral diaphysis, with minimal displacement. Overlying soft tissue swelling is noted. The right humeral head remains seated at the glenoid fossa. Mild degenerative change is noted at the right acromioclavicular joint. The visualized portions of the right lung are clear. IMPRESSION: Mildly comminuted fracture of the proximal right humeral diaphysis, with minimal displacement. Electronically Signed   By: Garald Balding M.D.   On: 06/14/2017 00:32   Ct Head Wo Contrast  Result Date: 06/14/2017 CLINICAL DATA:  [place] in dark bedroom, struck dresser. No loss of consciousness. RIGHT scalp laceration. EXAM: CT HEAD WITHOUT CONTRAST CT CERVICAL SPINE WITHOUT CONTRAST TECHNIQUE: Multidetector CT imaging of the head and cervical spine was performed following the standard protocol without intravenous contrast. Multiplanar CT image reconstructions of the cervical spine were also generated. COMPARISON:  CT HEAD April 01, 2017 FINDINGS: CT HEAD FINDINGS BRAIN: No intraparenchymal hemorrhage, mass effect nor midline shift. The ventricles and sulci are normal for age. Patchy supratentorial white matter hypodensities less than expected for patient's age, though non-specific are most compatible with chronic small vessel ischemic disease. LEFT  inferior basal ganglia perivascular space. No acute large vascular territory infarcts. No abnormal extra-axial fluid collections. Basal cisterns are patent. VASCULAR: Mild calcific atherosclerosis of the carotid siphons. SKULL: No skull fracture. Minimal RIGHT frontal scalp swelling without subcutaneous gas or radiopaque bodies. The SINUSES/ORBITS: The mastoid air-cells and included paranasal sinuses are well-aerated.The included ocular globes and orbital contents are non-suspicious. Status post bilateral ocular lens implants. OTHER: None. CT CERVICAL SPINE FINDINGS-motion degraded examination. ALIGNMENT: Straightened lordosis.  Vertebral bodies in alignment. SKULL BASE AND VERTEBRAE: Cervical vertebral bodies and posterior elements are intact. Moderate C6-7 disc height loss and endplate spurring compatible with degenerative disc. Severe RIGHT upper cervical facet arthropathy. No destructive bony lesions. C1-2 articulation maintained. SOFT TISSUES AND SPINAL CANAL: Nonacute. Mild calcific atherosclerosis carotid bifurcations. DISC LEVELS: No significant osseous canal stenosis. Moderate to severe LEFT C3-4, RIGHT C4-5 neural foraminal narrowing. UPPER CHEST: Lung apices are clear. OTHER: None. IMPRESSION: CT HEAD: 1. Negative noncontrast CT HEAD for age. CT CERVICAL SPINE: 1. No acute fracture or malalignment on this motion degraded examination. 2. Moderate to severe C3-4 and C4-5 neural foraminal narrowing. Electronically Signed   By: Elon Alas M.D.   On: 06/14/2017 00:21   Ct Cervical Spine Wo Contrast  Result Date: 06/14/2017 CLINICAL DATA:  [place] in dark bedroom, struck dresser. No loss of consciousness. RIGHT scalp laceration. EXAM: CT HEAD WITHOUT CONTRAST CT CERVICAL SPINE WITHOUT CONTRAST TECHNIQUE: Multidetector CT imaging of the head and cervical spine was performed following the standard protocol without intravenous contrast. Multiplanar CT image reconstructions of the cervical spine were also  generated. COMPARISON:  CT HEAD April 01, 2017 FINDINGS: CT HEAD FINDINGS BRAIN: No intraparenchymal  hemorrhage, mass effect nor midline shift. The ventricles and sulci are normal for age. Patchy supratentorial white matter hypodensities less than expected for patient's age, though non-specific are most compatible with chronic small vessel ischemic disease. LEFT inferior basal ganglia perivascular space. No acute large vascular territory infarcts. No abnormal extra-axial fluid collections. Basal cisterns are patent. VASCULAR: Mild calcific atherosclerosis of the carotid siphons. SKULL: No skull fracture. Minimal RIGHT frontal scalp swelling without subcutaneous gas or radiopaque bodies. The SINUSES/ORBITS: The mastoid air-cells and included paranasal sinuses are well-aerated.The included ocular globes and orbital contents are non-suspicious. Status post bilateral ocular lens implants. OTHER: None. CT CERVICAL SPINE FINDINGS-motion degraded examination. ALIGNMENT: Straightened lordosis.  Vertebral bodies in alignment. SKULL BASE AND VERTEBRAE: Cervical vertebral bodies and posterior elements are intact. Moderate C6-7 disc height loss and endplate spurring compatible with degenerative disc. Severe RIGHT upper cervical facet arthropathy. No destructive bony lesions. C1-2 articulation maintained. SOFT TISSUES AND SPINAL CANAL: Nonacute. Mild calcific atherosclerosis carotid bifurcations. DISC LEVELS: No significant osseous canal stenosis. Moderate to severe LEFT C3-4, RIGHT C4-5 neural foraminal narrowing. UPPER CHEST: Lung apices are clear. OTHER: None. IMPRESSION: CT HEAD: 1. Negative noncontrast CT HEAD for age. CT CERVICAL SPINE: 1. No acute fracture or malalignment on this motion degraded examination. 2. Moderate to severe C3-4 and C4-5 neural foraminal narrowing. Electronically Signed   By: Elon Alas M.D.   On: 06/14/2017 00:21   Dg Hip Unilat W Or Wo Pelvis 2-3 Views Right  Result Date:  06/14/2017 CLINICAL DATA:  Pain after a fall EXAM: DG HIP (WITH OR WITHOUT PELVIS) 2-3V RIGHT COMPARISON:  None. FINDINGS: No fracture or dislocation is seen.  Joint space is maintained. IMPRESSION: No acute osseous abnormality Electronically Signed   By: Donavan Foil M.D.   On: 06/14/2017 02:46    Assessment/Plan Acute respiratory failure, unspecified whether with hypoxia or hypercapnia ([place])  Send to ED via [facility]  Full Code  O2 10 l/ min NRB vs. 6 l/min. Nasal Canula  Family/ staff Communication:   Total Time: 35 minutes  Documentation: 10 minutes  Face to Face: 25 minutes total  Family/Phone: family at bedside   Labs/tests ordered:    Medication list reviewed and assessed for continued appropriateness.  Vikki Ports, NP-C Geriatrics [facility] [phone], [place] [address] Cell Phone (Mon-Fri 8am-5pm):  [phone] On Call:  [phone] & follow prompts after 5pm & weekends Office Phone:  [phone] Office Fax:  [phone]

## 2017-06-17 NOTE — H&P (Signed)
[facility] NAME: Jack Jenkins    MR#:  [id number]  DATE OF BIRTH:  06/22/1930  DATE OF ADMISSION:  06/17/2017  PRIMARY CARE PHYSICIAN: [name]., MD   REQUESTING/REFERRING PHYSICIAN: Orbie Pyo, MD  CHIEF COMPLAINT:   Chief Complaint  Patient presents with  . Shortness of Breath   Shortness of breath after discharge yesterday. HISTORY OF PRESENT ILLNESS:  Jack Jenkins  is a 84 y.o. male with a known history of multiple medical problems as below.  The patient was just discharged from the hospital yesterday due to right humerus fracture.  She was found shortness of breath and chest x-ray show possible pneumonia.  He is on home oxygen 4 L due to chronic respiratory failure. ED physician started antibiotics and heparin for possible PE.  PAST MEDICAL HISTORY:   Past Medical History:  Diagnosis Date  . CHF (congestive heart failure) ([place])   . CKD (chronic kidney disease)   . COPD (chronic obstructive pulmonary disease) ([place])   . Gout   . Hypertension   . Osteoarthritis   . Renal disorder     PAST SURGICAL HISTORY:   Past Surgical History:  Procedure Laterality Date  . BACK SURGERY    . CATARACT EXTRACTION Bilateral   . CHOLECYSTECTOMY    . FLEXIBLE BRONCHOSCOPY N/A 02/09/2017   Procedure: FLEXIBLE BRONCHOSCOPY;  Surgeon: Allyne Gee, MD;  Location: [facility] ORS;  Service: Pulmonary;  Laterality: N/A;  . HERNIA REPAIR    . INGUINAL HERNIA REPAIR    . LAMINECTOMY    . POLYPECTOMY     colon  . UPPER GI ENDOSCOPY  01/15/04    SOCIAL HISTORY:   Social History   Tobacco Use  . Smoking status: Never Smoker  . Smokeless tobacco: Never Used  Substance Use Topics  . Alcohol use: No    Alcohol/week: 0.0 oz    FAMILY HISTORY:   Family History  Problem Relation Age of Onset  . Heart failure Mother   . Heart attack Father   . CAD Sister   . Lung cancer Brother   . CAD Sister     DRUG  ALLERGIES:  No Known Allergies  REVIEW OF SYSTEMS:   Review of Systems  Constitutional: Negative for chills, fever and malaise/fatigue.  HENT: Negative for sore throat.   Eyes: Negative for blurred vision and double vision.  Respiratory: Positive for cough and shortness of breath. Negative for hemoptysis, sputum production, wheezing and stridor.   Cardiovascular: Negative for chest pain, palpitations, orthopnea and leg swelling.  Gastrointestinal: Negative for abdominal pain, blood in stool, diarrhea, melena, nausea and vomiting.  Genitourinary: Negative for dysuria, flank pain and hematuria.  Musculoskeletal: Negative for back pain and joint pain.  Neurological: Negative for dizziness, sensory change, focal weakness, seizures, loss of consciousness, weakness and headaches.  Endo/Heme/Allergies: Negative for polydipsia.  Psychiatric/Behavioral: Negative for depression. The patient is not nervous/anxious.     MEDICATIONS AT HOME:   Prior to Admission medications   Medication Sig Start Date End Date Taking? Authorizing Provider  acetaminophen (TYLENOL) 325 MG tablet Take 650 mg by mouth every 6 (six) hours as needed. Pain or increased fever.May be administered orally, per G-tube if needed or rectally if unable to swallow (separate order). Maximum dose for 24 hours is 3,000 mg from all sources of Acetaminophen/ Tylenol   Yes [provider]  allopurinol (ZYLOPRIM) 100 MG tablet Take 1 tablet (100  mg total) by mouth daily. 04/07/17  Yes Mody, Ulice Bold, MD  azithromycin (ZITHROMAX) 250 MG tablet Take 250 mg by mouth 3 (three) times a week. Take 1 tablet On Monday Wednesday and Friday   Yes [provider]  Cholecalciferol 4000 units CAPS Take 1 capsule by mouth daily.   Yes [provider]  colchicine 0.6 MG tablet Take 1 tablet (0.6 mg total) by mouth daily. 04/08/17  Yes Mody, Ulice Bold, MD  furosemide (LASIX) 40 MG tablet Take 1 tablet (40 mg total) by mouth every other  day. Monday, Wednesday, Friday 06/16/17  Yes Fritzi Mandes, MD  guaiFENesin (MUCINEX) 600 MG 12 hr tablet Take 1 tablet (600 mg total) by mouth 2 (two) times daily as needed for cough. 06/16/17  Yes Fritzi Mandes, MD  ipratropium-albuterol (DUONEB) 0.5-2.5 (3) MG/3ML SOLN Take 3 mLs by nebulization every 6 (six) hours. 06/16/17  Yes Fritzi Mandes, MD  metoprolol succinate (TOPROL XL) 25 MG 24 hr tablet Take 1 tablet (25 mg total) by mouth daily. 10/20/16  Yes [name]., MD  nitroGLYCERIN (NITROSTAT) 0.4 MG SL tablet Place 1 tablet (0.4 mg total) under the tongue every 5 (five) minutes as needed for chest pain. 10/08/16  Yes Wieting, Richard, MD  rifampin (RIFADIN) 300 MG capsule Take 600 mg by mouth 3 (three) times a week. On Monday Wednesday and Friday   Yes [provider]  traMADol (ULTRAM) 50 MG tablet Take 1 tablet (50 mg total) by mouth every 6 (six) hours as needed for moderate pain. 06/17/17  Yes Toni Arthurs, NP      VITAL SIGNS:  Blood pressure 112/67, pulse (!) 120, temperature 98.3 F (36.8 C), temperature source Oral, resp. rate (!) 22, SpO2 90 %.  PHYSICAL EXAMINATION:  Physical Exam  GENERAL:  84 y.o.-year-old patient lying in the bed with no acute distress.  EYES: Pupils equal, round, reactive to light and accommodation. No scleral icterus. Extraocular muscles intact.  HEENT: Head atraumatic, normocephalic. Oropharynx and nasopharynx clear.  NECK:  Supple, no jugular venous distention. No thyroid enlargement, no tenderness.  LUNGS: Normal breath sounds bilaterally, mild wheezing, no rales,rhonchi or crepitation. No use of accessory muscles of respiration.  CARDIOVASCULAR: S1, S2 normal. No murmurs, rubs, or gallops.  ABDOMEN: Soft, nontender, nondistended. Bowel sounds present. No organomegaly or mass.  EXTREMITIES: No pedal edema, cyanosis, or clubbing.  NEUROLOGIC: Cranial nerves II through XII are intact. Muscle strength 4/5 in all extremities. Sensation intact.  Gait not checked.  PSYCHIATRIC: The patient is alert and oriented x 2.  SKIN: No obvious rash, lesion, or ulcer.   LABORATORY PANEL:   CBC Recent Labs  Lab 06/17/17 1449  WBC 8.5  HGB 9.0*  HCT 27.8*  PLT 138*   ------------------------------------------------------------------------------------------------------------------  Chemistries  Recent Labs  Lab 06/17/17 1449  NA 140  K 5.4*  CL 92*  CO2 39*  GLUCOSE 138*  BUN 59*  CREATININE 2.21*  CALCIUM 8.5*   ------------------------------------------------------------------------------------------------------------------  Cardiac Enzymes Recent Labs  Lab 06/17/17 1449  TROPONINI 0.28*   ------------------------------------------------------------------------------------------------------------------  RADIOLOGY:  Dg Chest Port 1 View  Result Date: 06/17/2017 CLINICAL DATA:  Hypoxia. EXAM: PORTABLE CHEST 1 VIEW COMPARISON:  06/15/2017 FINDINGS: Enlarged cardiac silhouette. Calcific atherosclerotic disease of the aorta. Left lower lobe atelectasis versus airspace consolidation with increasing volume loss. 10 mm left upper lobe pulmonary nodule versus focal airspace consolidation. Osseous structures are without acute abnormality. Soft tissues are grossly normal. IMPRESSION: Left lower lobe atelectasis versus airspace consolidation  with increasing volume loss. Possible small left pleural effusion. 10 mm left upper lobe pulmonary nodule versus focal airspace consolidation. Enlarged cardiac silhouette. Electronically Signed   By: Fidela Salisbury M.D.   On: 06/17/2017 15:48      IMPRESSION AND PLAN:   Pneumonia, HAP, rule out PE. The patient will be admitted to medical floor. Continue cefepime and vancomycin pharmacy to dose, follow-up CBC and cultures.  NEB PRN. VQ scan and d-dimer.  Continue heparin drip.  Hyperkalemia.  Normal saline IV and follow-up BMP. Elevated troponin, unclear etiology, possible due to  demanding ischemia, follow-up troponin level and cardiology consult.  Chronic respiratory failure, continue home oxygen 4 L, NEB PRN. COPD.  No exacerbation.  NEB PRN. Dehydration and CKD stage IV.  Gentle IV fluid support and follow-up BMP.  All the records are reviewed and case discussed with ED provider. Management plans discussed with the patient, family and they are in agreement.  CODE STATUS: Full Code  TOTAL TIME TAKING CARE OF THIS PATIENT: 56 minutes.    Demetrios Loll M.D on 06/17/2017 at 6:21 PM  Between 7am to 6pm - Pager - [phone]  After 6pm go to [email or web address] - password [email or web address] [facility]  [facility]  Office  [phone]  CC: Primary care physician; [name]., MD   Note: This dictation was prepared with Dragon dictation along with smaller phrase technology. Any transcriptional errors that result from this process are unintentional.

## 2017-06-17 NOTE — Consult Note (Signed)
ORTHOPAEDIC CONSULTATION  REQUESTING PHYSICIAN: No att. providers found  Chief Complaint: right shoulder pain  HPI: Jack Jenkins is a 84 y.o. male who complains of  Right shoulder pain after a fall. Please see H&P and ED notes for details.  Past Medical History:  Diagnosis Date  . CHF (congestive heart failure) ([employer])   . CKD (chronic kidney disease)   . COPD (chronic obstructive pulmonary disease) ([place])   . Gout   . Hypertension   . Osteoarthritis   . Renal disorder    Past Surgical History:  Procedure Laterality Date  . BACK SURGERY    . CATARACT EXTRACTION Bilateral   . CHOLECYSTECTOMY    . FLEXIBLE BRONCHOSCOPY N/A 02/09/2017   Procedure: FLEXIBLE BRONCHOSCOPY;  Surgeon: Allyne Gee, MD;  Location: [facility] ORS;  Service: Pulmonary;  Laterality: N/A;  . HERNIA REPAIR    . INGUINAL HERNIA REPAIR    . LAMINECTOMY    . POLYPECTOMY     colon  . UPPER GI ENDOSCOPY  01/15/04   Social History   Socioeconomic History  . Marital status: Married    Spouse name: None  . Number of children: None  . Years of education: None  . Highest education level: None  Social Needs  . Financial resource strain: None  . Food insecurity - worry: None  . Food insecurity - inability: None  . Transportation needs - medical: None  . Transportation needs - non-medical: None  Occupational History  . Occupation: retired  Tobacco Use  . Smoking status: Never Smoker  . Smokeless tobacco: Never Used  Substance and Sexual Activity  . Alcohol use: No    Alcohol/week: 0.0 oz  . Drug use: No  . Sexual activity: No  Other Topics Concern  . None  Social History Narrative  . None   Family History  Problem Relation Age of Onset  . Heart failure Mother   . Heart attack Father   . CAD Sister   . Lung cancer Brother   . CAD Sister    No Known Allergies Prior to Admission medications   Medication Sig Start Date End Date Taking? Authorizing Provider  allopurinol (ZYLOPRIM) 100 MG tablet Take  1 tablet (100 mg total) by mouth daily. 04/07/17  Yes Mody, Ulice Bold, MD  azithromycin (ZITHROMAX) 250 MG tablet Take 250 mg by mouth 3 (three) times a week. Take 1 tablet On Monday Wednesday and Friday   Yes [provider]  metoprolol succinate (TOPROL XL) 25 MG 24 hr tablet Take 1 tablet (25 mg total) by mouth daily. 10/20/16  Yes [name]., MD  nitroGLYCERIN (NITROSTAT) 0.4 MG SL tablet Place 1 tablet (0.4 mg total) under the tongue every 5 (five) minutes as needed for chest pain. 10/08/16  Yes Wieting, Richard, MD  acetaminophen (TYLENOL) 325 MG tablet Take 650 mg by mouth every 6 (six) hours as needed. Pain or increased fever.May be administered orally, per G-tube if needed or rectally if unable to swallow (separate order). Maximum dose for 24 hours is 3,000 mg from all sources of Acetaminophen/ Tylenol    [provider]  Cholecalciferol 4000 units CAPS Take 1 capsule by mouth daily.    [provider]  colchicine 0.6 MG tablet Take 1 tablet (0.6 mg total) by mouth daily. 04/08/17   Bettey Costa, MD  furosemide (LASIX) 40 MG tablet Take 1 tablet (40 mg total) by mouth every other day. Monday, Wednesday, Friday 06/16/17   Fritzi Mandes, MD  guaiFENesin (MUCINEX) 600 MG 12 hr tablet Take 1 tablet (600 mg total) by mouth 2 (two) times daily as needed for cough. 06/16/17   Fritzi Mandes, MD  ipratropium-albuterol (DUONEB) 0.5-2.5 (3) MG/3ML SOLN Take 3 mLs by nebulization every 6 (six) hours. 06/16/17   Fritzi Mandes, MD  rifampin (RIFADIN) 300 MG capsule Take 600 mg by mouth 3 (three) times a week. On Monday Wednesday and Friday    [provider]  traMADol (ULTRAM) 50 MG tablet Take 1 tablet (50 mg total) by mouth every 6 (six) hours as needed for moderate pain. 06/17/17   Toni Arthurs, NP   Dg Chest 1 View  Result Date: 06/15/2017 CLINICAL DATA:  Wheezing, fall, humeral fracture EXAM: CHEST 1 VIEW COMPARISON:  Portable exam 1733 hours compared to 04/02/2017  FINDINGS: Enlargement of cardiac silhouette with pulmonary vascular congestion. Low lung volumes with bibasilar atelectasis greater on LEFT. Chronic nodular density projects over the upper to mid LEFT lung, corresponding to an area of nodular thickening/scarring at the major fissure on a prior CT. No definite infiltrate, pleural effusion or pneumothorax. Diffuse osseous demineralization. IMPRESSION: Enlargement of cardiac silhouette with pulmonary vascular congestion. Bibasilar atelectasis greater on LEFT. Stable nodular density at upper to mid LEFT lung. Electronically Signed   By: Lavonia Dana M.D.   On: 06/15/2017 17:47   Dg Chest Port 1 View  Result Date: 06/17/2017 CLINICAL DATA:  Hypoxia. EXAM: PORTABLE CHEST 1 VIEW COMPARISON:  06/15/2017 FINDINGS: Enlarged cardiac silhouette. Calcific atherosclerotic disease of the aorta. Left lower lobe atelectasis versus airspace consolidation with increasing volume loss. 10 mm left upper lobe pulmonary nodule versus focal airspace consolidation. Osseous structures are without acute abnormality. Soft tissues are grossly normal. IMPRESSION: Left lower lobe atelectasis versus airspace consolidation with increasing volume loss. Possible small left pleural effusion. 10 mm left upper lobe pulmonary nodule versus focal airspace consolidation. Enlarged cardiac silhouette. Electronically Signed   By: Fidela Salisbury M.D.   On: 06/17/2017 15:48    Positive ROS: All other systems have been reviewed and were otherwise negative with the exception of those mentioned in the HPI and as above.  Physical Exam: General: Alert, no acute distress Cardiovascular: No pedal edema Respiratory: No cyanosis, no use of accessory musculature GI: No organomegaly, abdomen is soft and non-tender Skin: No lesions in the area of chief complaint Neurologic: Sensation intact distally Psychiatric: Patient is competent for consent with normal mood and affect Lymphatic: No axillary or  cervical lymphadenopathy  MUSCULOSKELETAL: proximal shoulder swelling and ecchymosis, tenderness, +grip, motor and sensory intact distally  Assessment: Right proximal humerus fracture  Plan: The x-rays and treatment options are all discussed with the patient and family. Fortunately, the fracture can be treated non-operatively. Recommend elbow, wrist and hand motion. He will follow-up in my office in 1 to 2 weeks for repeat check and x-ray.    Lovell Sheehan, MD    06/17/2017 4:44 PM

## 2017-06-17 NOTE — ED Provider Notes (Addendum)
Signout from Dr. Derrill KayGoodman is 84 year old male with a recent admission at this hospital for humerus fracture.  Now from rehab presenting with shortness of breath.  Plan is follow-up with labs as well as imaging studies.  Dr. Derrill KayGoodman had suspicion for infectious source of the patient's presentation.  However, PE is still on the differential.  Physical Exam  BP 109/84 (BP Location: Left Arm)   Pulse (!) 106   Temp 98.3 F (36.8 C) (Oral)   Resp 18   SpO2 98%  ----------------------------------------- 5:49 PM on 06/17/2017 -----------------------------------------   Physical Exam Patient is weak appearing.  However, with oxygen saturations in the 90s with nasal cannula oxygen.  Tachycardic to the low 100s.  Hypertensive. ED Course/Procedures     Procedures  MDM  Chest x-ray with left lower lobe atelectasis versus airspace consolidation with increasing volume loss.  Patient also with positive troponin which was negative previously.  Patient denying any chest pain.  However, given recent admission he is a risk for PE.  He denies any bleeding in his stool.  He will be started on heparin.  Also discussed heparinizing the patient with recent fracture Dr. Allena KatzPatel does not suspect any risk.  Patient will be treated for pneumonia as well as started on heparin for possible PE.  Due to the patient's renal function I do not think it is safe to send him to the scanner for a CT angiography now.  Signed out to Dr. Imogene Burnhen.       Jack Jenkins, Jack Topor Matthew, MD 06/17/17 1750    Jack Jenkins, Jack Rudeavid Matthew, MD 06/17/17 [phone]

## 2017-06-17 NOTE — ED Provider Notes (Signed)
[facility] Emergency Department Provider Note   ____________________________________________   I have reviewed the triage vital signs and the nursing notes.   HISTORY  Chief Complaint Shortness of Breath   History limited by: Not Limited   HPI Jack Jenkins is a 84 y.o. male who presents to the emergency department today from [facility] rehab because of concerns for hypoxia and low blood pressure.  Patient states that he has felt slightly more short of breath today.  He was discharged from the hospital yesterday after admission secondary to fall and fractures.  He has had some continued pain in his right upper extremity.  Patient denies any significant chest pain or fevers.  He is on oxygen at baseline.   Per medical record review patient has a history of recent admission for fall, humerus fracture. COPD, CHF.  Past Medical History:  Diagnosis Date  . CHF (congestive heart failure) ([place])   . CKD (chronic kidney disease)   . COPD (chronic obstructive pulmonary disease) ([place])   . Gout   . Hypertension   . Osteoarthritis   . Renal disorder     Patient Active Problem List   Diagnosis Date Noted  . Humerus fracture 06/14/2017  . Dependence on supplemental oxygen 06/07/2017  . Interstitial pulmonary disease ([place]) 06/07/2017  . Mycobacterial infection 06/07/2017  . Chronic respiratory failure with hypoxia ([place]) 06/07/2017  . Pulmonary fibrosis ([place]) 06/07/2017  . 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 Sleep apnea 06/07/2017  . Bronchiolectasis ([place]) 06/07/2017  . COPD (chronic obstructive pulmonary disease) ([place]) 04/01/2017  . Benign essential HTN 11/24/2016  . Chronic diastolic CHF (congestive heart failure), NYHA class 3 ([place]) 11/24/2016  . Hyperlipidemia, mixed 10/15/2016  . Chest pain 10/07/2016  . Acute respiratory failure ([place]) 04/21/2015  . Dyspnea 04/21/2015  . COPD exacerbation ([place]) 04/21/2015  . Abnormal chest CT 04/21/2015  .  CKD (chronic kidney disease) 12/17/2014  . Photokeratitis 11/06/2014  . Allergic rhinitis 11/06/2014  . AB (asthmatic bronchitis) 11/06/2014  . CCF (congestive cardiac failure) ([place]) 11/06/2014  . Colon polyp 11/06/2014  . Urinary system disease 11/06/2014  . ED (erectile dysfunction) of organic origin 11/06/2014  . Acid reflux 11/06/2014  . Acute gouty arthropathy 11/06/2014  . Gout 11/06/2014  . Arthritis, degenerative 11/06/2014  . Fast heart beat 11/06/2014    Past Surgical History:  Procedure Laterality Date  . BACK SURGERY    . CATARACT EXTRACTION Bilateral   . CHOLECYSTECTOMY    . FLEXIBLE BRONCHOSCOPY N/A 02/09/2017   Procedure: FLEXIBLE BRONCHOSCOPY;  Surgeon: Allyne Gee, MD;  Location: [facility] ORS;  Service: Pulmonary;  Laterality: N/A;  . HERNIA REPAIR    . INGUINAL HERNIA REPAIR    . LAMINECTOMY    . POLYPECTOMY     colon  . UPPER GI ENDOSCOPY  01/15/04    Prior to Admission medications   Medication Sig Start Date End Date Taking? Authorizing Provider  acetaminophen (TYLENOL) 325 MG tablet Take 650 mg by mouth every 6 (six) hours as needed. Pain or increased fever.May be administered orally, per G-tube if needed or rectally if unable to swallow (separate order). Maximum dose for 24 hours is 3,000 mg from all sources of Acetaminophen/ Tylenol   Yes [provider]  allopurinol (ZYLOPRIM) 100 MG tablet Take 1 tablet (100 mg total) by mouth daily. 04/07/17  Yes Mody, Ulice Bold, MD  azithromycin (ZITHROMAX) 250 MG tablet Take 250 mg by mouth 3 (three) times a week. Take 1  tablet On Monday Wednesday and Friday   Yes [provider]  Cholecalciferol 4000 units CAPS Take 1 capsule by mouth daily.   Yes [provider]  colchicine 0.6 MG tablet Take 1 tablet (0.6 mg total) by mouth daily. 04/08/17  Yes Mody, Ulice Bold, MD  furosemide (LASIX) 40 MG tablet Take 1 tablet (40 mg total) by mouth every other day. Monday, Wednesday, Friday 06/16/17  Yes Fritzi Mandes,  MD  guaiFENesin (MUCINEX) 600 MG 12 hr tablet Take 1 tablet (600 mg total) by mouth 2 (two) times daily as needed for cough. 06/16/17  Yes Fritzi Mandes, MD  ipratropium-albuterol (DUONEB) 0.5-2.5 (3) MG/3ML SOLN Take 3 mLs by nebulization every 6 (six) hours. 06/16/17  Yes Fritzi Mandes, MD  metoprolol succinate (TOPROL XL) 25 MG 24 hr tablet Take 1 tablet (25 mg total) by mouth daily. 10/20/16  Yes [name]., MD  nitroGLYCERIN (NITROSTAT) 0.4 MG SL tablet Place 1 tablet (0.4 mg total) under the tongue every 5 (five) minutes as needed for chest pain. 10/08/16  Yes Wieting, Richard, MD  rifampin (RIFADIN) 300 MG capsule Take 600 mg by mouth 3 (three) times a week. On Monday Wednesday and Friday   Yes [provider]  traMADol (ULTRAM) 50 MG tablet Take 1 tablet (50 mg total) by mouth every 6 (six) hours as needed for moderate pain. 06/17/17  Yes Toni Arthurs, NP    Allergies Patient has no known allergies.  Family History  Problem Relation Age of Onset  . Heart failure Mother   . Heart attack Father   . CAD Sister   . Lung cancer Brother   . CAD Sister     Social History Social History   Tobacco Use  . Smoking status: Never Smoker  . Smokeless tobacco: Never Used  Substance Use Topics  . Alcohol use: No    Alcohol/week: 0.0 oz  . Drug use: No    Review of Systems Constitutional: No fever/chills Eyes: No visual changes. ENT: No sore throat. Cardiovascular: Denies chest pain. Respiratory: Positive for shortness of breath.  Gastrointestinal: No abdominal pain.  No nausea, no vomiting.  No diarrhea.   Genitourinary: Negative for dysuria. Musculoskeletal: Positive for right upper extremity pain. Skin: Negative for rash. Neurological: Negative for headaches, focal weakness or numbness.  ____________________________________________   PHYSICAL EXAM:  VITAL SIGNS: ED Triage Vitals [06/17/17 1455]  Enc Vitals Group     BP 109/84     Pulse Rate (!) 106     Resp  18     Temp 98.3 F (36.8 C)     Temp Source Oral     SpO2 98 %   Constitutional: Alert and oriented.  Eyes: Conjunctivae are normal.  ENT   Head: Normocephalic and atraumatic.   Nose: No congestion/rhinnorhea.   Mouth/Throat: Mucous membranes are moist.   Neck: No stridor. Hematological/Lymphatic/Immunilogical: No cervical lymphadenopathy. Cardiovascular: Tachycardic, regular rhythm.  No murmurs, rubs, or gallops. Respiratory: Normal respiratory effort with tachycardia. Breath sounds are clear and equal bilaterally.  Gastrointestinal: Soft and non tender. No rebound. No guarding.  Genitourinary: Deferred Musculoskeletal: Normal range of motion in all extremities. No lower extremity edema. Neurologic:  Normal speech and language. No gross focal neurologic deficits are appreciated.  Skin:  Skin is warm, dry and intact. No rash noted. Psychiatric: Mood and affect are normal. Speech and behavior are normal. Patient exhibits appropriate insight and judgment.  ____________________________________________    LABS (pertinent positives/negatives)  Labs pending at  time of sign out.  ____________________________________________   EKG  I, Nance Pear, attending physician, personally viewed and interpreted this EKG  EKG Time: 1405 Rate: 98 Rhythm: sinus rhythm Axis: normal Intervals: qtc 440 QRS: nonspecific IVCD ST changes: no st elevation Impression: abnormal ekg  ____________________________________________    RADIOLOGY  CXR pending at time of sign out  ____________________________________________   PROCEDURES  Procedures  ____________________________________________   INITIAL IMPRESSION / ASSESSMENT AND PLAN / ED COURSE  Pertinent labs & imaging results that were available during my care of the patient were reviewed by me and considered in my medical decision making (see chart for details).  Patient presents from rehab facility today because  of concerns for hypoxia and low blood pressure.  Differential would include infection, pneumonia, pulmonary embolism, cardiac etiology, pneumothorax amongst other etiologies.  Workup is pending at time of signout.   ____________________________________________   FINAL CLINICAL IMPRESSION(S) / ED DIAGNOSES  Hypoxia  Note: This dictation was prepared with Dragon dictation. Any transcriptional errors that result from this process are unintentional     Nance Pear, MD 06/18/17 [phone]

## 2017-06-17 NOTE — Progress Notes (Signed)
Pharmacy Antibiotic Note  Jack Jenkins is a 84 y.o. male admitted on 06/17/2017 with pneumonia.  Pharmacy has been consulted for vancomycin dosing.  Plan: Vancomycin 1000mg  IV every 24 hours.  Goal trough 15-20 mcg/mL. cefepime 2gm iv q24h      Temp (24hrs), Avg:98.4 F (36.9 C), Min:98.3 F (36.8 C), Max:98.5 F (36.9 C)  Recent Labs  Lab 06/13/17 2347 06/14/17 0432 06/15/17 0442 06/16/17 0309 06/17/17 1449  WBC 4.9 11.7*  --   --  8.5  CREATININE 1.51* 1.65* 2.44* 2.14* 2.21*  LATICACIDVEN  --   --   --   --  1.4    Estimated Creatinine Clearance: 27.8 mL/min (A) (by C-G formula based on SCr of 2.21 mg/dL (H)).    No Known Allergies  Antimicrobials this admission: Anti-infectives (From admission, onward)   Start     Dose/Rate Route Frequency Ordered Stop   06/18/17 1700  ceFEPIme (MAXIPIME) 2 g in dextrose 5 % 50 mL IVPB     2 g 100 mL/hr over 30 Minutes Intravenous Every 24 hours 06/17/17 1832     06/18/17 0100  vancomycin (VANCOCIN) IVPB 1000 mg/200 mL premix     1,000 mg 200 mL/hr over 60 Minutes Intravenous Every 24 hours 06/17/17 1833     06/17/17 1745  ceFEPIme (MAXIPIME) 2 g in dextrose 5 % 50 mL IVPB     2 g 100 mL/hr over 30 Minutes Intravenous  Once 06/17/17 1736     06/17/17 1745  vancomycin (VANCOCIN) IVPB 1000 mg/200 mL premix     1,000 mg 200 mL/hr over 60 Minutes Intravenous  Once 06/17/17 1736        Microbiology results: No results found for this or any previous visit (from the past 240 hour(s)).   Thank you for allowing pharmacy to be a part of this patient's care.  Jack Jenkins 06/17/2017 6:34 PM

## 2017-06-18 ENCOUNTER — Inpatient Hospital Stay: Payer: Medicare Other

## 2017-06-18 LAB — BASIC METABOLIC PANEL
Anion gap: 7 (ref 5–15)
BUN: 62 mg/dL — AB (ref 6–20)
CO2: 40 mmol/L — AB (ref 22–32)
CREATININE: 2.04 mg/dL — AB (ref 0.61–1.24)
Calcium: 8.2 mg/dL — ABNORMAL LOW (ref 8.9–10.3)
Chloride: 93 mmol/L — ABNORMAL LOW (ref 101–111)
GFR calc Af Amer: 33 mL/min — ABNORMAL LOW (ref >60)
GFR calc non Af Amer: 28 mL/min — ABNORMAL LOW (ref >60)
Glucose, Bld: 105 mg/dL — ABNORMAL HIGH (ref 65–99)
Potassium: 4.2 mmol/L (ref 3.5–5.1)
SODIUM: 140 mmol/L (ref 135–145)

## 2017-06-18 LAB — URINALYSIS, COMPLETE (UACMP) WITH MICROSCOPIC
BILIRUBIN URINE: NEGATIVE
Glucose, UA: NEGATIVE mg/dL
Ketones, ur: NEGATIVE mg/dL
Leukocytes, UA: NEGATIVE
Nitrite: NEGATIVE
PROTEIN: NEGATIVE mg/dL
SQUAMOUS EPITHELIAL / LPF: NONE SEEN
Specific Gravity, Urine: 1.009 (ref 1.005–1.030)
WBC UA: NONE SEEN WBC/hpf (ref 0–5)
pH: 5 (ref 5.0–8.0)

## 2017-06-18 LAB — CBC
HCT: 26.3 % — ABNORMAL LOW (ref 40.0–52.0)
Hemoglobin: 8.6 g/dL — ABNORMAL LOW (ref 13.0–18.0)
MCH: 31.2 pg (ref 26.0–34.0)
MCHC: 32.9 g/dL (ref 32.0–36.0)
MCV: 95.1 fL (ref 80.0–100.0)
PLATELETS: 134 10*3/uL — AB (ref 150–440)
RBC: 2.76 MIL/uL — ABNORMAL LOW (ref 4.40–5.90)
RDW: 15.3 % — AB (ref 11.5–14.5)
WBC: 9.7 10*3/uL (ref 3.8–10.6)

## 2017-06-18 LAB — HEPARIN LEVEL (UNFRACTIONATED)
HEPARIN UNFRACTIONATED: 0.68 [IU]/mL (ref 0.30–0.70)
Heparin Unfractionated: 0.73 IU/mL — ABNORMAL HIGH (ref 0.30–0.70)
Heparin Unfractionated: <0.1 IU/mL — ABNORMAL LOW (ref 0.30–0.70)

## 2017-06-18 LAB — MRSA PCR SCREENING: MRSA by PCR: NEGATIVE

## 2017-06-18 MED ORDER — TECHNETIUM TC 99M DIETHYLENETRIAME-PENTAACETIC ACID
32.6100 | Freq: Once | INTRAVENOUS | Status: AC | PRN
  Administered 2017-06-18: 32.61 via RESPIRATORY_TRACT

## 2017-06-18 MED ORDER — TECHNETIUM TO 99M ALBUMIN AGGREGATED
4.3400 | Freq: Once | INTRAVENOUS | Status: AC | PRN
  Administered 2017-06-18: 4.34 via INTRAVENOUS

## 2017-06-18 NOTE — Progress Notes (Signed)
ANTICOAGULATION CONSULT NOTE - Initial Consult  Pharmacy Consult for heparin gtt Indication: pulmonary embolus  No Known Allergies  Patient Measurements: Height: 5\' 9"  (175.3 cm) Weight: 210 lb (95.3 kg) IBW/kg (Calculated) : 70.7 Heparin Dosing Weight: 90.5kg  Vital Signs: Temp: 98.5 F (36.9 C) (02/07 2008) Temp Source: Oral (02/07 2008) BP: 90/72 (02/07 2008) Pulse Rate: 111 (02/08 0017)  Labs: Recent Labs    06/16/17 0309 06/17/17 1449 06/17/17 1804 06/17/17 2102 06/18/17 0236  HGB  --  9.0*  --   --  8.6*  HCT  --  27.8*  --   --  26.3*  PLT  --  138*  --   --  134*  APTT  --   --  28  --   --   LABPROT  --   --  17.4*  --   --   INR  --   --  1.44  --   --   HEPARINUNFRC  --   --   --   --  0.68  CREATININE 2.14* 2.21*  --   --  2.04*  TROPONINI  --  0.28*  --  0.27*  --     Estimated Creatinine Clearance: 30.1 mL/min (A) (by C-G formula based on SCr of 2.04 mg/dL (H)).   Medical History: Past Medical History:  Diagnosis Date  . CHF (congestive heart failure) ([facility])   . CKD (chronic kidney disease)   . COPD (chronic obstructive pulmonary disease) ([facility])   . Gout   . Hypertension   . Osteoarthritis   . Renal disorder     Medications:  Medications Prior to Admission  Medication Sig Dispense Refill Last Dose  . acetaminophen (TYLENOL) 325 MG tablet Take 650 mg by mouth every 6 (six) hours as needed. Pain or increased fever.May be administered orally, per G-tube if needed or rectally if unable to swallow (separate order). Maximum dose for 24 hours is 3,000 mg from all sources of Acetaminophen/ Tylenol   PRN at PRN  . allopurinol (ZYLOPRIM) 100 MG tablet Take 1 tablet (100 mg total) by mouth daily. 30 tablet 0 06/17/2017 at AM  . azithromycin (ZITHROMAX) 250 MG tablet Take 250 mg by mouth 3 (three) times a week. Take 1 tablet On Monday Wednesday and Friday   06/16/2017 at AM  . Cholecalciferol 4000 units CAPS Take 1 capsule by mouth daily.   06/17/2017 at AM  .  colchicine 0.6 MG tablet Take 1 tablet (0.6 mg total) by mouth daily. 15 tablet 0 06/17/2017 at AM  . furosemide (LASIX) 40 MG tablet Take 1 tablet (40 mg total) by mouth every other day. Monday, Wednesday, Friday 30 tablet 0 06/16/2017 at AM  . guaiFENesin (MUCINEX) 600 MG 12 hr tablet Take 1 tablet (600 mg total) by mouth 2 (two) times daily as needed for cough. 14 tablet 0 06/17/2017 at AM  . ipratropium-albuterol (DUONEB) 0.5-2.5 (3) MG/3ML SOLN Take 3 mLs by nebulization every 6 (six) hours. 360 mL 0 06/17/2017 at AM  . metoprolol succinate (TOPROL XL) 25 MG 24 hr tablet Take 1 tablet (25 mg total) by mouth daily. 90 tablet 3 06/17/2017 at AM  . nitroGLYCERIN (NITROSTAT) 0.4 MG SL tablet Place 1 tablet (0.4 mg total) under the tongue every 5 (five) minutes as needed for chest pain. 30 tablet 0 PRN at PRN  . rifampin (RIFADIN) 300 MG capsule Take 600 mg by mouth 3 (three) times a week. On Monday Wednesday and Friday  06/16/2017 at PM  . traMADol (ULTRAM) 50 MG tablet Take 1 tablet (50 mg total) by mouth every 6 (six) hours as needed for moderate pain. 120 tablet 0 PRN at PRN   Scheduled:  . allopurinol  100 mg Oral Daily  . azithromycin  250 mg Oral Once per day on Mon Wed Fri  . cholecalciferol  4,000 Units Oral Daily  . colchicine  0.6 mg Oral Daily  . furosemide  40 mg Oral Q M,W,F  . ipratropium-albuterol  3 mL Nebulization Q6H  . metoprolol succinate  25 mg Oral Daily  . rifampin  600 mg Oral Once per day on Mon Wed Fri  . sodium chloride flush  3 mL Intravenous Q12H   Infusions:  . sodium chloride    . sodium chloride 50 mL/hr at 06/17/17 2030  . ceFEPime (MAXIPIME) IV    . heparin 1,300 Units/hr (06/17/17 1826)  . vancomycin Stopped (06/18/17 0134)   PRN:  Anti-infectives (From admission, onward)   Start     Dose/Rate Route Frequency Ordered Stop   06/18/17 1700  ceFEPIme (MAXIPIME) 2 g in dextrose 5 % 50 mL IVPB     2 g 100 mL/hr over 30 Minutes Intravenous Every 24 hours 06/17/17  1832     06/18/17 0900  azithromycin (ZITHROMAX) tablet 250 mg    Comments:  Take 1 tablet On Monday Wednesday and Friday     250 mg Oral Once per day on Mon Wed Fri 06/17/17 2020     06/18/17 0900  rifampin (RIFADIN) capsule 600 mg    Comments:  On Monday Wednesday and Friday     600 mg Oral Once per day on Mon Wed Fri 06/17/17 2020     06/18/17 0100  vancomycin (VANCOCIN) IVPB 1000 mg/200 mL premix     1,000 mg 200 mL/hr over 60 Minutes Intravenous Every 24 hours 06/17/17 1833     06/17/17 1745  ceFEPIme (MAXIPIME) 2 g in dextrose 5 % 50 mL IVPB     2 g 100 mL/hr over 30 Minutes Intravenous  Once 06/17/17 1736 06/17/17 1848   06/17/17 1745  vancomycin (VANCOCIN) IVPB 1000 mg/200 mL premix     1,000 mg 200 mL/hr over 60 Minutes Intravenous  Once 06/17/17 1736 06/17/17 1957      Assessment: 84 year old man with PE requiring heparin anticoagulation per pharmacy.   Goal of Therapy:  Heparin level 0.3-0.7 units/ml Monitor platelets by anticoagulation protocol: Yes   Plan:  Give 4000 units bolus x 1 Start heparin infusion at 1300 units/hr Check anti-Xa level in 8 hours and daily while on heparin Continue to monitor H&H and platelets   02/08 0300 heparin level 0.68. Continue current regimen. Recheck in 8 hours to confirm.  [place] Reek, PharmD, BCPS  06/18/17 3:47 AM

## 2017-06-18 NOTE — Progress Notes (Signed)
[facility] [place] [facility]   PATIENT NAME: Jack AmesGrady Jenkins    MR#:  [id number]  DATE OF BIRTH:  [birth year]  SUBJECTIVE: Admitted yesterday for hypoxia, found to have pneumonia by chest x-ray, VQ scan is done which  did not show PE.  Patient had recent right shoulder repair.  Wife thinks he was discharged home last time.  Found to have hypoxia with oxygen saturations in upper 50s as a nursing home oxygen.   he is 94% on 4 L here in the hospital.  His main complaint today is pain in the right shoulder.  CHIEF COMPLAINT:   Chief Complaint  Patient presents with  . Shortness of Breath    REVIEW OF SYSTEMS:   ROS CONSTITUTIONAL: No fever, fatigue or weakness.  EYES: No blurred or double vision.  EARS, NOSE, AND THROAT: No tinnitus or ear pain.  RESPIRATORY: Shortness of breath, shortness of breath, wheezing or hemoptysis.  CARDIOVASCULAR: No chest pain, orthopnea, edema.  GASTROINTESTINAL: No nausea, vomiting, diarrhea or abdominal pain.  GENITOURINARY: No dysuria, hematuria.  ENDOCRINE: No polyuria, nocturia,  HEMATOLOGY: No anemia, easy bruising or bleeding SKIN: No rash or lesion. MUSCULOSKELETAL: Right shoulder pain nEUROLOGIC: No tingling, numbness, weakness.  PSYCHIATRY: No anxiety or depression.   DRUG ALLERGIES:  No Known Allergies  VITALS:  Blood pressure (!) 115/56, pulse (!) 105, temperature 98.2 F (36.8 C), temperature source Oral, resp. rate 16, height 5\' 9"  (1.753 m), weight 95.3 kg (210 lb), SpO2 96 %.  PHYSICAL EXAMINATION:  GENERAL:  84 y.o.-year-old patient lying in the bed with no acute distress.  EYES: Pupils equal, round, reactive to light. No scleral icterus. Extraocular muscles intact.  HEENT: Head atraumatic, normocephalic. Oropharynx and nasopharynx clear.  NECK:  Supple, no jugular venous distention. No thyroid enlargement, no tenderness.  LUNGS: Patient has faint wheezing bilaterally. CARDIOVASCULAR: S1, S2 normal. No  murmurs, rubs, or gallops.  ABDOMEN: Soft, nontender, nondistended. Bowel sounds present. No organomegaly or mass.  EXTREMITIES: No pedal edema, cyanosis, or clubbing.  NEUROLOGIC: Cranial nerves II through XII are intact. Muscle strength 5/5 in all extremities. Sensation intact. Gait not checked.  PSYCHIATRIC: The patient is alert and oriented x 3.  SKIN: No obvious rash, lesion, or ulcer.    LABORATORY PANEL:   CBC Recent Labs  Lab 06/18/17 0236  WBC 9.7  HGB 8.6*  HCT 26.3*  PLT 134*   ------------------------------------------------------------------------------------------------------------------  Chemistries  Recent Labs  Lab 06/18/17 0236  NA 140  K 4.2  CL 93*  CO2 40*  GLUCOSE 105*  BUN 62*  CREATININE 2.04*  CALCIUM 8.2*   ------------------------------------------------------------------------------------------------------------------  Cardiac Enzymes Recent Labs  Lab 06/17/17 2102  TROPONINI 0.27*   ------------------------------------------------------------------------------------------------------------------  RADIOLOGY:  Nm Pulmonary Perf And Vent  Result Date: 06/18/2017 CLINICAL DATA:  Elevated D-dimer. The patient suffered a fall 06/13/2017 with a right humerus fracture. EXAM: NUCLEAR MEDICINE VENTILATION - PERFUSION LUNG SCAN TECHNIQUE: Ventilation images were obtained in multiple projections using inhaled aerosol Tc-6463m DTPA. Perfusion images were obtained in multiple projections after intravenous injection of Tc-8363m MAA. RADIOPHARMACEUTICALS:  32.61 mCi of Tc-7263m DTPA aerosol inhalation and 4.34 mCi Tc3063m MAA-IV COMPARISON:  Single-view of the chest 06/17/2017 and 04/02/2017. CT chest 04/01/2017. FINDINGS: Ventilation: Radiotracer distribution is patchy likely due to technical factors. Decreased lung volume on the left is consistent with elevated left hemidiaphragm seen on prior plain film. Perfusion: No wedge shaped peripheral perfusion defects to  suggest acute pulmonary embolism. IMPRESSION: Negative for  pulmonary embolus. Electronically Signed   By: Drusilla Kanner M.D.   On: 06/18/2017 13:10   Dg Chest Port 1 View  Result Date: 06/17/2017 CLINICAL DATA:  Hypoxia. EXAM: PORTABLE CHEST 1 VIEW COMPARISON:  06/15/2017 FINDINGS: Enlarged cardiac silhouette. Calcific atherosclerotic disease of the aorta. Left lower lobe atelectasis versus airspace consolidation with increasing volume loss. 10 mm left upper lobe pulmonary nodule versus focal airspace consolidation. Osseous structures are without acute abnormality. Soft tissues are grossly normal. IMPRESSION: Left lower lobe atelectasis versus airspace consolidation with increasing volume loss. Possible small left pleural effusion. 10 mm left upper lobe pulmonary nodule versus focal airspace consolidation. Enlarged cardiac silhouette. Electronically Signed   By: Ted Mcalpine M.D.   On: 06/17/2017 15:48    EKG:   Orders placed or performed during the hospital encounter of 06/17/17  . EKG 12-Lead  . EKG 12-Lead    ASSESSMENT AND PLAN:  Acute on chronic respiratory failure secondary to healthcare associated pneumonia: Continue IV antibiotics, Continue vancomycin, cefepime, discussed the lab findings and the plan with patient's wife. 2.  Recent right shoulder repair: Continue physical therapy, VQ scan this time did not show any PE.  #3 slightly elevated troponins, no chest pain;, seen by cardiology, slightly elevated troponins of 0.28, 0.27 due to underlying pulmonary pathology.  No further cardiac intervention recommended. CKD stage III: Stable mild hyperkalemia: Improved.  All the records are reviewed and case discussed with Care Management/Social Workerr. Management plans discussed with the patient, family and they are in agreement.  CODE STATUS: Full code  TOTAL TIME TAKING CARE OF THIS PATIENT: 35 minutes.   POSSIBLE D/C IN 1-2 DAYS, DEPENDING ON CLINICAL  CONDITION.   Katha Hamming M.D on 06/18/2017 at 5:07 PM  Between 7am to 6pm - Pager - [phone]  After 6pm go to [email or web address] - password [email or web address] [facility]  [place] [facility]  Office  [phone]  CC: Primary care physician; Maple Hudson., MD   Note: This dictation was prepared with Dragon dictation along with smaller phrase technology. Any transcriptional errors that result from this process are unintentional.

## 2017-06-18 NOTE — Progress Notes (Signed)
Family Meeting Note  Advance Directive:yes  Today a meeting took place with patients wife.     The following clinical team members were present during this meeting:MD  Wife who is healthcare power of attorney wanted him to be full code. Additional follow-up to be provided during hospitalization.;  Time spent during discussion; 15 minutes :Katha HammingSnehalatha Saket Hellstrom, MD

## 2017-06-18 NOTE — Progress Notes (Addendum)
ANTICOAGULATION CONSULT NOTE - Initial Consult  Pharmacy Consult for heparin gtt Indication: pulmonary embolus  No Known Allergies  Patient Measurements: Height: 5\' 9"  (175.3 cm) Weight: 210 lb (95.3 kg) IBW/kg (Calculated) : 70.7 Heparin Dosing Weight: 90.5kg  Vital Signs: Temp: 98.8 F (37.1 C) (02/08 0726) Temp Source: Oral (02/08 0726) BP: 127/66 (02/08 0726) Pulse Rate: 109 (02/08 1035)  Labs: Recent Labs    06/16/17 0309 06/17/17 1449 06/17/17 1804 06/17/17 2102 06/18/17 0236 06/18/17 1105  HGB  --  9.0*  --   --  8.6*  --   HCT  --  27.8*  --   --  26.3*  --   PLT  --  138*  --   --  134*  --   APTT  --   --  28  --   --   --   LABPROT  --   --  17.4*  --   --   --   INR  --   --  1.44  --   --   --   HEPARINUNFRC  --   --   --   --  0.68 0.73*  CREATININE 2.14* 2.21*  --   --  2.04*  --   TROPONINI  --  0.28*  --  0.27*  --   --     Estimated Creatinine Clearance: 30.1 mL/min (A) (by C-G formula based on SCr of 2.04 mg/dL (H)).   Medical History: Past Medical History:  Diagnosis Date  . CHF (congestive heart failure) (HCC)   . CKD (chronic kidney disease)   . COPD (chronic obstructive pulmonary disease) (HCC)   . Gout   . Hypertension   . Osteoarthritis   . Renal disorder     Medications:  Medications Prior to Admission  Medication Sig Dispense Refill Last Dose  . acetaminophen (TYLENOL) 325 MG tablet Take 650 mg by mouth every 6 (six) hours as needed. Pain or increased fever.May be administered orally, per G-tube if needed or rectally if unable to swallow (separate order). Maximum dose for 24 hours is 3,000 mg from all sources of Acetaminophen/ Tylenol   PRN at PRN  . allopurinol (ZYLOPRIM) 100 MG tablet Take 1 tablet (100 mg total) by mouth daily. 30 tablet 0 06/17/2017 at AM  . azithromycin (ZITHROMAX) 250 MG tablet Take 250 mg by mouth 3 (three) times a week. Take 1 tablet On Monday Wednesday and Friday   06/16/2017 at AM  . Cholecalciferol 4000  units CAPS Take 1 capsule by mouth daily.   06/17/2017 at AM  . colchicine 0.6 MG tablet Take 1 tablet (0.6 mg total) by mouth daily. 15 tablet 0 06/17/2017 at AM  . furosemide (LASIX) 40 MG tablet Take 1 tablet (40 mg total) by mouth every other day. Monday, Wednesday, Friday 30 tablet 0 06/16/2017 at AM  . guaiFENesin (MUCINEX) 600 MG 12 hr tablet Take 1 tablet (600 mg total) by mouth 2 (two) times daily as needed for cough. 14 tablet 0 06/17/2017 at AM  . ipratropium-albuterol (DUONEB) 0.5-2.5 (3) MG/3ML SOLN Take 3 mLs by nebulization every 6 (six) hours. 360 mL 0 06/17/2017 at AM  . metoprolol succinate (TOPROL XL) 25 MG 24 hr tablet Take 1 tablet (25 mg total) by mouth daily. 90 tablet 3 06/17/2017 at AM  . nitroGLYCERIN (NITROSTAT) 0.4 MG SL tablet Place 1 tablet (0.4 mg total) under the tongue every 5 (five) minutes as needed for chest pain. 30 tablet 0  PRN at PRN  . rifampin (RIFADIN) 300 MG capsule Take 600 mg by mouth 3 (three) times a week. On Monday Wednesday and Friday   06/16/2017 at PM  . traMADol (ULTRAM) 50 MG tablet Take 1 tablet (50 mg total) by mouth every 6 (six) hours as needed for moderate pain. 120 tablet 0 PRN at PRN   Scheduled:  . allopurinol  100 mg Oral Daily  . azithromycin  250 mg Oral Once per day on Mon Wed Fri  . cholecalciferol  4,000 Units Oral Daily  . colchicine  0.6 mg Oral Daily  . furosemide  40 mg Oral Q M,W,F  . ipratropium-albuterol  3 mL Nebulization Q6H  . metoprolol succinate  25 mg Oral Daily  . rifampin  600 mg Oral Once per day on Mon Wed Fri  . sodium chloride flush  3 mL Intravenous Q12H   Infusions:  . sodium chloride    . sodium chloride 50 mL/hr at 06/18/17 0914  . ceFEPime (MAXIPIME) IV    . heparin 1,300 Units/hr (06/18/17 0914)  . vancomycin Stopped (06/18/17 0134)   PRN:  Anti-infectives (From admission, onward)   Start     Dose/Rate Route Frequency Ordered Stop   06/18/17 1700  ceFEPIme (MAXIPIME) 2 g in dextrose 5 % 50 mL IVPB     2  g 100 mL/hr over 30 Minutes Intravenous Every 24 hours 06/17/17 1832     06/18/17 0900  azithromycin (ZITHROMAX) tablet 250 mg    Comments:  Take 1 tablet On Monday Wednesday and Friday     250 mg Oral Once per day on Mon Wed Fri 06/17/17 2020     06/18/17 0900  rifampin (RIFADIN) capsule 600 mg    Comments:  On Monday Wednesday and Friday     600 mg Oral Once per day on Mon Wed Fri 06/17/17 2020     06/18/17 0100  vancomycin (VANCOCIN) IVPB 1000 mg/200 mL premix     1,000 mg 200 mL/hr over 60 Minutes Intravenous Every 24 hours 06/17/17 1833     06/17/17 1745  ceFEPIme (MAXIPIME) 2 g in dextrose 5 % 50 mL IVPB     2 g 100 mL/hr over 30 Minutes Intravenous  Once 06/17/17 1736 06/17/17 1848   06/17/17 1745  vancomycin (VANCOCIN) IVPB 1000 mg/200 mL premix     1,000 mg 200 mL/hr over 60 Minutes Intravenous  Once 06/17/17 1736 06/17/17 1957      Assessment: 84 year old man with PE requiring heparin anticoagulation per pharmacy.   Goal of Therapy:  Heparin level 0.3-0.7 units/ml Monitor platelets by anticoagulation protocol: Yes   Plan:  Give 4000 units bolus x 1 Start heparin infusion at 1300 units/hr Check anti-Xa level in 8 hours and daily while on heparin Continue to monitor H&H and platelets   02/08 0300 heparin level 0.68. Continue current regimen. Recheck in 8 hours to confirm.\  02/08: heparin level @ 0.73 resulted @ ~11. Will decrease heparin gtt rate to 1200 units/hr. Will recheck Heparin level @ 2130.   Demetrius Charity, PharmD  06/18/17 1:18 PM   2/8 PM heparin level <0.1. Drip d/c by attending.  Fulton Reek, PharmD, BCPS  06/18/17 11:19 PM

## 2017-06-18 NOTE — Consult Note (Signed)
[facility] Cardiology  CARDIOLOGY CONSULT NOTE  Patient ID: Jack Jenkins MRN: [id number] DOB/AGE: [birth year] 84 y.o.  Admit date: 06/17/2017 Referring Physician  Primary Physician Dr. Rosanna Randy Primary Cardiologist Dr. Serafina Royals  Reason for Consultation Elevated troponin   HPI: Mr Jack Jenkins is an 84 year old male with PMH significant for COPD, CHF, HTN, CKD, who presented to the ED on 2/7 for hypoxia, hypotension, and shortness of breath from [facility] rehab. Patient was discharged from the hospital on 2/6 after admission secondary to a fall and humerus fracture. Due to elevated troponin, cardiology was consulted.Today, patient appears in mild discomfort, on 3L of oxygen. He denies current chest pain, shortness of breath, palpitations, or peripheral edema. He denies abdominal pain, nausea or vomiting. Patient does admit to moderate discomfort in the right shoulder/arm and right hip with movement.   Patient's primary cardiologist is Dr. Serafina Royals at [facility]. Last echo in 11/2016 showed normal left ventricular function with an EF of 50%. No valvular stenosis but mild TR, MR.   Review of systems complete and found to be negative unless listed above     Past Medical History:  Diagnosis Date  . CHF (congestive heart failure) ([place])   . CKD (chronic kidney disease)   . COPD (chronic obstructive pulmonary disease) ([place])   . Gout   . Hypertension   . Osteoarthritis   . Renal disorder     Past Surgical History:  Procedure Laterality Date  . BACK SURGERY    . CATARACT EXTRACTION Bilateral   . CHOLECYSTECTOMY    . FLEXIBLE BRONCHOSCOPY N/A 02/09/2017   Procedure: FLEXIBLE BRONCHOSCOPY;  Surgeon: Allyne Gee, MD;  Location: [facility] ORS;  Service: Pulmonary;  Laterality: N/A;  . HERNIA REPAIR    . INGUINAL HERNIA REPAIR    . LAMINECTOMY    . POLYPECTOMY     colon  . UPPER GI ENDOSCOPY  01/15/04    Medications Prior to Admission  Medication Sig Dispense Refill Last Dose  . acetaminophen  (TYLENOL) 325 MG tablet Take 650 mg by mouth every 6 (six) hours as needed. Pain or increased fever.May be administered orally, per G-tube if needed or rectally if unable to swallow (separate order). Maximum dose for 24 hours is 3,000 mg from all sources of Acetaminophen/ Tylenol   PRN at PRN  . allopurinol (ZYLOPRIM) 100 MG tablet Take 1 tablet (100 mg total) by mouth daily. 30 tablet 0 06/17/2017 at AM  . azithromycin (ZITHROMAX) 250 MG tablet Take 250 mg by mouth 3 (three) times a week. Take 1 tablet On Monday Wednesday and Friday   06/16/2017 at AM  . Cholecalciferol 4000 units CAPS Take 1 capsule by mouth daily.   06/17/2017 at AM  . colchicine 0.6 MG tablet Take 1 tablet (0.6 mg total) by mouth daily. 15 tablet 0 06/17/2017 at AM  . furosemide (LASIX) 40 MG tablet Take 1 tablet (40 mg total) by mouth every other day. Monday, Wednesday, Friday 30 tablet 0 06/16/2017 at AM  . guaiFENesin (MUCINEX) 600 MG 12 hr tablet Take 1 tablet (600 mg total) by mouth 2 (two) times daily as needed for cough. 14 tablet 0 06/17/2017 at AM  . ipratropium-albuterol (DUONEB) 0.5-2.5 (3) MG/3ML SOLN Take 3 mLs by nebulization every 6 (six) hours. 360 mL 0 06/17/2017 at AM  . metoprolol succinate (TOPROL XL) 25 MG 24 hr tablet Take 1 tablet (25 mg total) by mouth daily. 90 tablet 3 06/17/2017 at AM  . nitroGLYCERIN (NITROSTAT)  0.4 MG SL tablet Place 1 tablet (0.4 mg total) under the tongue every 5 (five) minutes as needed for chest pain. 30 tablet 0 PRN at PRN  . rifampin (RIFADIN) 300 MG capsule Take 600 mg by mouth 3 (three) times a week. On Monday Wednesday and Friday   06/16/2017 at PM  . traMADol (ULTRAM) 50 MG tablet Take 1 tablet (50 mg total) by mouth every 6 (six) hours as needed for moderate pain. 120 tablet 0 PRN at PRN   Social History   Socioeconomic History  . Marital status: Married    Spouse name: Not on file  . Number of children: Not on file  . Years of education: Not on file  . Highest education level: Not on  file  Social Needs  . Financial resource strain: Not on file  . Food insecurity - worry: Not on file  . Food insecurity - inability: Not on file  . Transportation needs - medical: Not on file  . Transportation needs - non-medical: Not on file  Occupational History  . Occupation: retired  Tobacco Use  . Smoking status: Never Smoker  . Smokeless tobacco: Never Used  Substance and Sexual Activity  . Alcohol use: No    Alcohol/week: 0.0 oz  . Drug use: No  . Sexual activity: No  Other Topics Concern  . Not on file  Social History Narrative  . Not on file    Family History  Problem Relation Age of Onset  . Heart failure Mother   . Heart attack Father   . CAD Sister   . Lung cancer Brother   . CAD Sister       Review of systems complete and found to be negative unless listed above      PHYSICAL EXAM  General: Well developed, well nourished, in moderate distress HEENT:  Normocephalic and atramatic Neck:  No JVD.  Lungs: Bilateral inspiratory and expiratory wheezes  Heart: Tachycardiac, regular rhythm . Normal S1 and S2 without gallops or murmurs. Distal pulses 2+ bilaterally.  Abdomen: Bowel sounds are positive, abdomen mildly distended but non-tender  Msk:  Gait not assessed. Significant ecchymosis in right upper arm.  Extremities: No clubbing, cyanosis or edema.   Neuro: Alert and oriented X 3. Psych:  Good affect, responds appropriately  Labs:   Lab Results  Component Value Date   WBC 9.7 06/18/2017   HGB 8.6 (L) 06/18/2017   HCT 26.3 (L) 06/18/2017   MCV 95.1 06/18/2017   PLT 134 (L) 06/18/2017    Recent Labs  Lab 06/18/17 0236  NA 140  K 4.2  CL 93*  CO2 40*  BUN 62*  CREATININE 2.04*  CALCIUM 8.2*  GLUCOSE 105*   Lab Results  Component Value Date   CKTOTAL 27 (L) 05/16/2013   CKMB 1.0 05/16/2013   TROPONINI 0.27 (HH) 06/17/2017    Lab Results  Component Value Date   CHOL 144 09/18/2013   Lab Results  Component Value Date   HDL 50  09/18/2013   Lab Results  Component Value Date   LDLCALC 81 09/18/2013   Lab Results  Component Value Date   TRIG 64 09/18/2013   No results found for: CHOLHDL No results found for: LDLDIRECT    Radiology: Dg Chest 1 View  Result Date: 06/15/2017 CLINICAL DATA:  Wheezing, fall, humeral fracture EXAM: CHEST 1 VIEW COMPARISON:  Portable exam 1733 hours compared to 04/02/2017 FINDINGS: Enlargement of cardiac silhouette with pulmonary vascular congestion. Low lung volumes  with bibasilar atelectasis greater on LEFT. Chronic nodular density projects over the upper to mid LEFT lung, corresponding to an area of nodular thickening/scarring at the major fissure on a prior CT. No definite infiltrate, pleural effusion or pneumothorax. Diffuse osseous demineralization. IMPRESSION: Enlargement of cardiac silhouette with pulmonary vascular congestion. Bibasilar atelectasis greater on LEFT. Stable nodular density at upper to mid LEFT lung. Electronically Signed   By: Lavonia Dana M.D.   On: 06/15/2017 17:47   Dg Shoulder Right  Result Date: 06/14/2017 CLINICAL DATA:  Status post fall, striking dresser. Right shoulder pain, acute onset. Initial encounter. EXAM: RIGHT SHOULDER - 2+ VIEW COMPARISON:  Chest radiograph performed 04/02/2017 FINDINGS: There is a mildly comminuted fracture of the proximal right humeral diaphysis, with minimal displacement. Overlying soft tissue swelling is noted. The right humeral head remains seated at the glenoid fossa. Mild degenerative change is noted at the right acromioclavicular joint. The visualized portions of the right lung are clear. IMPRESSION: Mildly comminuted fracture of the proximal right humeral diaphysis, with minimal displacement. Electronically Signed   By: Garald Balding M.D.   On: 06/14/2017 00:32   Ct Head Wo Contrast  Result Date: 06/14/2017 CLINICAL DATA:  [place] in dark bedroom, struck dresser. No loss of consciousness. RIGHT scalp laceration. EXAM: CT HEAD  WITHOUT CONTRAST CT CERVICAL SPINE WITHOUT CONTRAST TECHNIQUE: Multidetector CT imaging of the head and cervical spine was performed following the standard protocol without intravenous contrast. Multiplanar CT image reconstructions of the cervical spine were also generated. COMPARISON:  CT HEAD April 01, 2017 FINDINGS: CT HEAD FINDINGS BRAIN: No intraparenchymal hemorrhage, mass effect nor midline shift. The ventricles and sulci are normal for age. Patchy supratentorial white matter hypodensities less than expected for patient's age, though non-specific are most compatible with chronic small vessel ischemic disease. LEFT inferior basal ganglia perivascular space. No acute large vascular territory infarcts. No abnormal extra-axial fluid collections. Basal cisterns are patent. VASCULAR: Mild calcific atherosclerosis of the carotid siphons. SKULL: No skull fracture. Minimal RIGHT frontal scalp swelling without subcutaneous gas or radiopaque bodies. The SINUSES/ORBITS: The mastoid air-cells and included paranasal sinuses are well-aerated.The included ocular globes and orbital contents are non-suspicious. Status post bilateral ocular lens implants. OTHER: None. CT CERVICAL SPINE FINDINGS-motion degraded examination. ALIGNMENT: Straightened lordosis.  Vertebral bodies in alignment. SKULL BASE AND VERTEBRAE: Cervical vertebral bodies and posterior elements are intact. Moderate C6-7 disc height loss and endplate spurring compatible with degenerative disc. Severe RIGHT upper cervical facet arthropathy. No destructive bony lesions. C1-2 articulation maintained. SOFT TISSUES AND SPINAL CANAL: Nonacute. Mild calcific atherosclerosis carotid bifurcations. DISC LEVELS: No significant osseous canal stenosis. Moderate to severe LEFT C3-4, RIGHT C4-5 neural foraminal narrowing. UPPER CHEST: Lung apices are clear. OTHER: None. IMPRESSION: CT HEAD: 1. Negative noncontrast CT HEAD for age. CT CERVICAL SPINE: 1. No acute fracture or  malalignment on this motion degraded examination. 2. Moderate to severe C3-4 and C4-5 neural foraminal narrowing. Electronically Signed   By: Elon Alas M.D.   On: 06/14/2017 00:21   Ct Cervical Spine Wo Contrast  Result Date: 06/14/2017 CLINICAL DATA:  [place] in dark bedroom, struck dresser. No loss of consciousness. RIGHT scalp laceration. EXAM: CT HEAD WITHOUT CONTRAST CT CERVICAL SPINE WITHOUT CONTRAST TECHNIQUE: Multidetector CT imaging of the head and cervical spine was performed following the standard protocol without intravenous contrast. Multiplanar CT image reconstructions of the cervical spine were also generated. COMPARISON:  CT HEAD April 01, 2017 FINDINGS: CT HEAD FINDINGS BRAIN: No intraparenchymal hemorrhage, mass  effect nor midline shift. The ventricles and sulci are normal for age. Patchy supratentorial white matter hypodensities less than expected for patient's age, though non-specific are most compatible with chronic small vessel ischemic disease. LEFT inferior basal ganglia perivascular space. No acute large vascular territory infarcts. No abnormal extra-axial fluid collections. Basal cisterns are patent. VASCULAR: Mild calcific atherosclerosis of the carotid siphons. SKULL: No skull fracture. Minimal RIGHT frontal scalp swelling without subcutaneous gas or radiopaque bodies. The SINUSES/ORBITS: The mastoid air-cells and included paranasal sinuses are well-aerated.The included ocular globes and orbital contents are non-suspicious. Status post bilateral ocular lens implants. OTHER: None. CT CERVICAL SPINE FINDINGS-motion degraded examination. ALIGNMENT: Straightened lordosis.  Vertebral bodies in alignment. SKULL BASE AND VERTEBRAE: Cervical vertebral bodies and posterior elements are intact. Moderate C6-7 disc height loss and endplate spurring compatible with degenerative disc. Severe RIGHT upper cervical facet arthropathy. No destructive bony lesions. C1-2 articulation maintained.  SOFT TISSUES AND SPINAL CANAL: Nonacute. Mild calcific atherosclerosis carotid bifurcations. DISC LEVELS: No significant osseous canal stenosis. Moderate to severe LEFT C3-4, RIGHT C4-5 neural foraminal narrowing. UPPER CHEST: Lung apices are clear. OTHER: None. IMPRESSION: CT HEAD: 1. Negative noncontrast CT HEAD for age. CT CERVICAL SPINE: 1. No acute fracture or malalignment on this motion degraded examination. 2. Moderate to severe C3-4 and C4-5 neural foraminal narrowing. Electronically Signed   By: Elon Alas M.D.   On: 06/14/2017 00:21   Dg Chest Port 1 View  Result Date: 06/17/2017 CLINICAL DATA:  Hypoxia. EXAM: PORTABLE CHEST 1 VIEW COMPARISON:  06/15/2017 FINDINGS: Enlarged cardiac silhouette. Calcific atherosclerotic disease of the aorta. Left lower lobe atelectasis versus airspace consolidation with increasing volume loss. 10 mm left upper lobe pulmonary nodule versus focal airspace consolidation. Osseous structures are without acute abnormality. Soft tissues are grossly normal. IMPRESSION: Left lower lobe atelectasis versus airspace consolidation with increasing volume loss. Possible small left pleural effusion. 10 mm left upper lobe pulmonary nodule versus focal airspace consolidation. Enlarged cardiac silhouette. Electronically Signed   By: Fidela Salisbury M.D.   On: 06/17/2017 15:48   Dg Hip Unilat W Or Wo Pelvis 2-3 Views Right  Result Date: 06/14/2017 CLINICAL DATA:  Pain after a fall EXAM: DG HIP (WITH OR WITHOUT PELVIS) 2-3V RIGHT COMPARISON:  None. FINDINGS: No fracture or dislocation is seen.  Joint space is maintained. IMPRESSION: No acute osseous abnormality Electronically Signed   By: Donavan Foil M.D.   On: 06/14/2017 02:46    EKG: Normal sinus rhythm with RBB. No evidence of acute ischemia.   ASSESSMENT AND PLAN:   1. Shortness of breath, hypoxia   - Rule out PE. V-Q scan recommended due to poor renal function (GFR 28, creatinine 2.04)  - Continue heparin drip  for possible PE   - CXR revealed left lower lobe atelectasis, possible small left pleural effusion  - Possible pneumonia, patient started on Cefipime and Vancomycin  2. Elevated troponin (.28, .27)  - Likely due to underlying pulmonary pathology  - ECG negative for acute ischemia, patient currently in no chest pain   Signed: Doristine Mango PA-S 06/18/2017, 11:43 AM  Pt seen and examined. Agree with assessment and plan as outlined above.

## 2017-06-18 NOTE — Care Management Important Message (Signed)
Important Message  Patient Details  Name: Jack Jenkins MRN: [id number] Date of Birth: [birth year]   Medicare Important Message Given:  Yes    Chapman FitchBOWEN, Lakenzie Mcclafferty T, RN 06/18/2017, 2:29 PM

## 2017-06-18 NOTE — Progress Notes (Signed)
PT Cancellation Note  Patient Details Name: Jack Jenkins MRN: [id number] DOB: Jan 29, 1931   Cancelled Treatment:    Reason Eval/Treat Not Completed: Fatigue/lethargy limiting ability to participate.  PT is previous treating therapist of this pt, cleared for PE and will reattempt in the AM.   Ivar Drapeuth E Anamae Rochelle 06/18/2017, 5:38 PM   Samul Dadauth Isela Stantz, PT MS Acute Rehab Dept. Number: [facility] [phone] and [facility] [phone]

## 2017-06-19 LAB — CBC
HCT: 26.4 % — ABNORMAL LOW (ref 40.0–52.0)
HEMOGLOBIN: 8.5 g/dL — AB (ref 13.0–18.0)
MCH: 31.4 pg (ref 26.0–34.0)
MCHC: 32.3 g/dL (ref 32.0–36.0)
MCV: 97 fL (ref 80.0–100.0)
Platelets: 166 10*3/uL (ref 150–440)
RBC: 2.72 MIL/uL — ABNORMAL LOW (ref 4.40–5.90)
RDW: 15.8 % — ABNORMAL HIGH (ref 11.5–14.5)
WBC: 6.3 10*3/uL (ref 3.8–10.6)

## 2017-06-19 LAB — PREPARE RBC (CROSSMATCH)

## 2017-06-19 LAB — STREP PNEUMONIAE URINARY ANTIGEN: STREP PNEUMO URINARY ANTIGEN: NEGATIVE

## 2017-06-19 LAB — ABO/RH: ABO/RH(D): O POS

## 2017-06-19 MED ORDER — METOPROLOL SUCCINATE ER 50 MG PO TB24
50.0000 mg | ORAL_TABLET | Freq: Every day | ORAL | Status: DC
  Administered 2017-06-20 – 2017-06-21 (×2): 50 mg via ORAL
  Filled 2017-06-19 (×2): qty 1

## 2017-06-19 MED ORDER — IPRATROPIUM-ALBUTEROL 0.5-2.5 (3) MG/3ML IN SOLN
3.0000 mL | Freq: Three times a day (TID) | RESPIRATORY_TRACT | Status: DC
  Administered 2017-06-19 – 2017-06-22 (×9): 3 mL via RESPIRATORY_TRACT
  Filled 2017-06-19 (×9): qty 3

## 2017-06-19 MED ORDER — FUROSEMIDE 10 MG/ML IJ SOLN
20.0000 mg | Freq: Once | INTRAMUSCULAR | Status: AC
  Administered 2017-06-19: 20 mg via INTRAVENOUS
  Filled 2017-06-19: qty 4

## 2017-06-19 MED ORDER — LACTATED RINGERS IV SOLN
INTRAVENOUS | Status: DC
  Administered 2017-06-19: 16:00:00 via INTRAVENOUS

## 2017-06-19 MED ORDER — DIPHENHYDRAMINE HCL 25 MG PO CAPS
25.0000 mg | ORAL_CAPSULE | Freq: Once | ORAL | Status: AC
  Administered 2017-06-19: 25 mg via ORAL
  Filled 2017-06-19: qty 1

## 2017-06-19 MED ORDER — ACETAMINOPHEN 325 MG PO TABS
650.0000 mg | ORAL_TABLET | Freq: Once | ORAL | Status: AC
  Administered 2017-06-19: 650 mg via ORAL
  Filled 2017-06-19: qty 2

## 2017-06-19 NOTE — Progress Notes (Signed)
PT Cancellation Note  Patient Details Name: Starleen BlueGrady A Appelbaum MRN: [id number] DOB: 1930/12/07   Cancelled Treatment:    Reason Eval/Treat Not Completed: Medical issues which prohibited therapy;Other (comment).  Pt is hypoxic and pulse was 119, MD in room and requested PT hold today.  New order in for refitting small sling to support R humeral fracture.   Ivar DrapeRuth E Dustyn Dansereau 06/19/2017, 11:42 AM   Samul Dadauth Deloras Reichard, PT MS Acute Rehab Dept. Number: [facility] [phone] and [facility] [phone]

## 2017-06-19 NOTE — Clinical Social Work Note (Signed)
Clinical Social Work Assessment  Patient Details  Name: Jack Jenkins MRN: [id number] Date of Birth: January 13, 1931  Date of referral:  06/19/17               Reason for consult:  Facility Placement                Permission sought to share information with:  Chartered certified accountant granted to share information::  Yes, Verbal Permission Granted  Name::        Agency::     Relationship::     Contact Information:     Housing/Transportation Living arrangements for the past 2 months:  [place] of Information:  Patient, Medical Team, Facility, Spouse Patient Interpreter Needed:  None Criminal Activity/Legal Involvement Pertinent to Current Situation/Hospitalization:  No - Comment as needed Significant Relationships:  Adult Children, Warehouse manager, Spouse Lives with:  Spouse Do you feel safe going back to the place where you live?  Yes Need for family participation in patient care:  No (Coment)  Care giving concerns:  Patient admitted from [place] Worker assessment / plan:  The CSW met with the patient and his wife at bedside to discuss discharge planning. The patient deferred to his wife to answer due to being sleepy. The patient's wife stated that the hope is that the patient can discharge back to [facility] place when stable pending bed availability as they did not have a bed hold.   The CSW contacted Sharyn Lull at [facility] who reported that the patient can return to [place] as soon as tomorrow if stable. The plan is for discharge tomorrow if he is stable. CSW will continue to follow.  Employment status:  Retired Nurse, adult PT Recommendations:  Not assessed at this time Information / Referral to community resources:     Patient/Family's Response to care:  The patient and his family thanked the CSW.  Patient/Family's Understanding of and Emotional Response to Diagnosis, Current Treatment, and  Prognosis:  The patient and his family understand and agree with the discharge plan.  Emotional Assessment Appearance:  Appears stated age Attitude/Demeanor/Rapport:  Lethargic Affect (typically observed):  Appropriate Orientation:  Oriented to Self, Oriented to Place, Oriented to  Time, Oriented to Situation Alcohol / Substance use:  Never Used Psych involvement (Current and /or in the community):  No (Comment)  Discharge Needs  Concerns to be addressed:  Care Coordination, Discharge Planning Concerns Readmission within the last 30 days:  Yes Current discharge risk:  Chronically ill Barriers to Discharge:  Continued Medical Work up   [employer], LCSW 06/19/2017, 3:36 PM

## 2017-06-19 NOTE — Progress Notes (Signed)
2200 dose of lasix not on floor, RN requested pharmacy to send and reschedule.

## 2017-06-19 NOTE — Progress Notes (Signed)
Patient Name: Jack Jenkins Date of Encounter: 06/19/2017  Hospital Problem List     Active Problems:   Pneumonia    Patient Profile     84 yo male with history of right humeral fracture admitted with increased sob and tachycardic Subjective   Right arm pain and sob  Inpatient Medications    . allopurinol  100 mg Oral Daily  . azithromycin  250 mg Oral Once per day on Mon Wed Fri  . cholecalciferol  4,000 Units Oral Daily  . colchicine  0.6 mg Oral Daily  . furosemide  40 mg Oral Q M,W,F  . ipratropium-albuterol  3 mL Nebulization Q6H  . [START ON 06/20/2017] metoprolol succinate  50 mg Oral Daily  . rifampin  600 mg Oral Once per day on Mon Wed Fri  . sodium chloride flush  3 mL Intravenous Q12H    Vital Signs    Vitals:   06/18/17 2127 06/19/17 0442 06/19/17 1149 06/19/17 1206  BP: 122/69 (!) 112/49  (!) 112/54  Pulse: (!) 106 (!) 111  (!) 117  Resp: 20 20  17   Temp: 98 F (36.7 C) 98.5 F (36.9 C)  98 F (36.7 C)  TempSrc: Oral Oral  Oral  SpO2: 100% 92% 92% 94%  Weight:      Height:        Intake/Output Summary (Last 24 hours) at 06/19/2017 1333 Last data filed at 06/19/2017 1215 Gross per 24 hour  Intake 407.03 ml  Output 750 ml  Net -342.97 ml   Filed Weights   06/17/17 2152  Weight: 95.3 kg (210 lb)    Physical Exam    GEN: Well nourished, well developed, in no acute distress.  HEENT: normal.  Neck: Supple, no JVD, carotid bruits, or masses. Cardiac: Tachycardia with bilateral lower extremey edema.  Radials/DP/PT 2+ and equal bilaterally.  Respiratory:  Respirations regular with rhonchi and occasional wheezes GI: Soft, nontender, nondistended, BS + x 4. MS: no deformity or atrophy. Skin: warm and dry, no rash. Neuro:  Strength and sensation are intact. Psych: Normal affect.  Labs    CBC Recent Labs    06/17/17 1449 06/18/17 0236 06/19/17 0703  WBC 8.5 9.7 6.3  NEUTROABS 7.0*  --   --   HGB 9.0* 8.6* 8.5*  HCT 27.8* 26.3* 26.4*  MCV  95.1 95.1 97.0  PLT 138* 134* 166   Basic Metabolic Panel Recent Labs    16/10/96 1449 06/18/17 0236  NA 140 140  K 5.4* 4.2  CL 92* 93*  CO2 39* 40*  GLUCOSE 138* 105*  BUN 59* 62*  CREATININE 2.21* 2.04*  CALCIUM 8.5* 8.2*   Liver Function Tests No results for input(s): AST, ALT, ALKPHOS, BILITOT, PROT, ALBUMIN in the last 72 hours. No results for input(s): LIPASE, AMYLASE in the last 72 hours. Cardiac Enzymes Recent Labs    06/17/17 1449 06/17/17 2102  TROPONINI 0.28* 0.27*   BNP No results for input(s): BNP in the last 72 hours. D-Dimer No results for input(s): DDIMER in the last 72 hours. Hemoglobin A1C No results for input(s): HGBA1C in the last 72 hours. Fasting Lipid Panel No results for input(s): CHOL, HDL, LDLCALC, TRIG, CHOLHDL, LDLDIRECT in the last 72 hours. Thyroid Function Tests No results for input(s): TSH, T4TOTAL, T3FREE, THYROIDAB in the last 72 hours.  Invalid input(s): FREET3  Telemetry    afib with rvr  ECG    afib with rvr  Radiology    Dg  Chest 1 View  Result Date: 06/15/2017 CLINICAL DATA:  Wheezing, fall, humeral fracture EXAM: CHEST 1 VIEW COMPARISON:  Portable exam 1733 hours compared to 04/02/2017 FINDINGS: Enlargement of cardiac silhouette with pulmonary vascular congestion. Low lung volumes with bibasilar atelectasis greater on LEFT. Chronic nodular density projects over the upper to mid LEFT lung, corresponding to an area of nodular thickening/scarring at the major fissure on a prior CT. No definite infiltrate, pleural effusion or pneumothorax. Diffuse osseous demineralization. IMPRESSION: Enlargement of cardiac silhouette with pulmonary vascular congestion. Bibasilar atelectasis greater on LEFT. Stable nodular density at upper to mid LEFT lung. Electronically Signed   By: Ulyses Southward M.D.   On: 06/15/2017 17:47   Dg Shoulder Right  Result Date: 06/14/2017 CLINICAL DATA:  Status post fall, striking dresser. Right shoulder pain,  acute onset. Initial encounter. EXAM: RIGHT SHOULDER - 2+ VIEW COMPARISON:  Chest radiograph performed 04/02/2017 FINDINGS: There is a mildly comminuted fracture of the proximal right humeral diaphysis, with minimal displacement. Overlying soft tissue swelling is noted. The right humeral head remains seated at the glenoid fossa. Mild degenerative change is noted at the right acromioclavicular joint. The visualized portions of the right lung are clear. IMPRESSION: Mildly comminuted fracture of the proximal right humeral diaphysis, with minimal displacement. Electronically Signed   By: Roanna Raider M.D.   On: 06/14/2017 00:32   Ct Head Wo Contrast  Result Date: 06/14/2017 CLINICAL DATA:  Larey Seat in dark bedroom, struck dresser. No loss of consciousness. RIGHT scalp laceration. EXAM: CT HEAD WITHOUT CONTRAST CT CERVICAL SPINE WITHOUT CONTRAST TECHNIQUE: Multidetector CT imaging of the head and cervical spine was performed following the standard protocol without intravenous contrast. Multiplanar CT image reconstructions of the cervical spine were also generated. COMPARISON:  CT HEAD April 01, 2017 FINDINGS: CT HEAD FINDINGS BRAIN: No intraparenchymal hemorrhage, mass effect nor midline shift. The ventricles and sulci are normal for age. Patchy supratentorial white matter hypodensities less than expected for patient's age, though non-specific are most compatible with chronic small vessel ischemic disease. LEFT inferior basal ganglia perivascular space. No acute large vascular territory infarcts. No abnormal extra-axial fluid collections. Basal cisterns are patent. VASCULAR: Mild calcific atherosclerosis of the carotid siphons. SKULL: No skull fracture. Minimal RIGHT frontal scalp swelling without subcutaneous gas or radiopaque bodies. The SINUSES/ORBITS: The mastoid air-cells and included paranasal sinuses are well-aerated.The included ocular globes and orbital contents are non-suspicious. Status post bilateral  ocular lens implants. OTHER: None. CT CERVICAL SPINE FINDINGS-motion degraded examination. ALIGNMENT: Straightened lordosis.  Vertebral bodies in alignment. SKULL BASE AND VERTEBRAE: Cervical vertebral bodies and posterior elements are intact. Moderate C6-7 disc height loss and endplate spurring compatible with degenerative disc. Severe RIGHT upper cervical facet arthropathy. No destructive bony lesions. C1-2 articulation maintained. SOFT TISSUES AND SPINAL CANAL: Nonacute. Mild calcific atherosclerosis carotid bifurcations. DISC LEVELS: No significant osseous canal stenosis. Moderate to severe LEFT C3-4, RIGHT C4-5 neural foraminal narrowing. UPPER CHEST: Lung apices are clear. OTHER: None. IMPRESSION: CT HEAD: 1. Negative noncontrast CT HEAD for age. CT CERVICAL SPINE: 1. No acute fracture or malalignment on this motion degraded examination. 2. Moderate to severe C3-4 and C4-5 neural foraminal narrowing. Electronically Signed   By: Awilda Metro M.D.   On: 06/14/2017 00:21   Ct Cervical Spine Wo Contrast  Result Date: 06/14/2017 CLINICAL DATA:  Larey Seat in dark bedroom, struck dresser. No loss of consciousness. RIGHT scalp laceration. EXAM: CT HEAD WITHOUT CONTRAST CT CERVICAL SPINE WITHOUT CONTRAST TECHNIQUE: Multidetector CT imaging of the head  and cervical spine was performed following the standard protocol without intravenous contrast. Multiplanar CT image reconstructions of the cervical spine were also generated. COMPARISON:  CT HEAD April 01, 2017 FINDINGS: CT HEAD FINDINGS BRAIN: No intraparenchymal hemorrhage, mass effect nor midline shift. The ventricles and sulci are normal for age. Patchy supratentorial white matter hypodensities less than expected for patient's age, though non-specific are most compatible with chronic small vessel ischemic disease. LEFT inferior basal ganglia perivascular space. No acute large vascular territory infarcts. No abnormal extra-axial fluid collections. Basal cisterns  are patent. VASCULAR: Mild calcific atherosclerosis of the carotid siphons. SKULL: No skull fracture. Minimal RIGHT frontal scalp swelling without subcutaneous gas or radiopaque bodies. The SINUSES/ORBITS: The mastoid air-cells and included paranasal sinuses are well-aerated.The included ocular globes and orbital contents are non-suspicious. Status post bilateral ocular lens implants. OTHER: None. CT CERVICAL SPINE FINDINGS-motion degraded examination. ALIGNMENT: Straightened lordosis.  Vertebral bodies in alignment. SKULL BASE AND VERTEBRAE: Cervical vertebral bodies and posterior elements are intact. Moderate C6-7 disc height loss and endplate spurring compatible with degenerative disc. Severe RIGHT upper cervical facet arthropathy. No destructive bony lesions. C1-2 articulation maintained. SOFT TISSUES AND SPINAL CANAL: Nonacute. Mild calcific atherosclerosis carotid bifurcations. DISC LEVELS: No significant osseous canal stenosis. Moderate to severe LEFT C3-4, RIGHT C4-5 neural foraminal narrowing. UPPER CHEST: Lung apices are clear. OTHER: None. IMPRESSION: CT HEAD: 1. Negative noncontrast CT HEAD for age. CT CERVICAL SPINE: 1. No acute fracture or malalignment on this motion degraded examination. 2. Moderate to severe C3-4 and C4-5 neural foraminal narrowing. Electronically Signed   By: Awilda Metro M.D.   On: 06/14/2017 00:21   Nm Pulmonary Perf And Vent  Result Date: 06/18/2017 CLINICAL DATA:  Elevated D-dimer. The patient suffered a fall 06/13/2017 with a right humerus fracture. EXAM: NUCLEAR MEDICINE VENTILATION - PERFUSION LUNG SCAN TECHNIQUE: Ventilation images were obtained in multiple projections using inhaled aerosol Tc-73m DTPA. Perfusion images were obtained in multiple projections after intravenous injection of Tc-87m MAA. RADIOPHARMACEUTICALS:  32.61 mCi of Tc-37m DTPA aerosol inhalation and 4.34 mCi Tc8m MAA-IV COMPARISON:  Single-view of the chest 06/17/2017 and 04/02/2017. CT chest  04/01/2017. FINDINGS: Ventilation: Radiotracer distribution is patchy likely due to technical factors. Decreased lung volume on the left is consistent with elevated left hemidiaphragm seen on prior plain film. Perfusion: No wedge shaped peripheral perfusion defects to suggest acute pulmonary embolism. IMPRESSION: Negative for pulmonary embolus. Electronically Signed   By: Drusilla Kanner M.D.   On: 06/18/2017 13:10   Dg Chest Port 1 View  Result Date: 06/17/2017 CLINICAL DATA:  Hypoxia. EXAM: PORTABLE CHEST 1 VIEW COMPARISON:  06/15/2017 FINDINGS: Enlarged cardiac silhouette. Calcific atherosclerotic disease of the aorta. Left lower lobe atelectasis versus airspace consolidation with increasing volume loss. 10 mm left upper lobe pulmonary nodule versus focal airspace consolidation. Osseous structures are without acute abnormality. Soft tissues are grossly normal. IMPRESSION: Left lower lobe atelectasis versus airspace consolidation with increasing volume loss. Possible small left pleural effusion. 10 mm left upper lobe pulmonary nodule versus focal airspace consolidation. Enlarged cardiac silhouette. Electronically Signed   By: Ted Mcalpine M.D.   On: 06/17/2017 15:48   Dg Hip Unilat W Or Wo Pelvis 2-3 Views Right  Result Date: 06/14/2017 CLINICAL DATA:  Pain after a fall EXAM: DG HIP (WITH OR WITHOUT PELVIS) 2-3V RIGHT COMPARISON:  None. FINDINGS: No fracture or dislocation is seen.  Joint space is maintained. IMPRESSION: No acute osseous abnormality Electronically Signed   By: Adrian Prows.D.  On: 06/14/2017 02:46    Assessment & Plan    Pt with recent humoral fx no with hypoxia, probable pna and tachycardia  Tachycardia -Will continue to attempt rate control. Will increase metoprolol to 50 mg daily. EKG to evaluate rhythm. Telemetry.   SOB/hypoxia-continue with abx and oxygen. Continue with oxygen.   Signed, Darlin PriestlyKenneth A. Sadey Yandell MD 06/19/2017, 1:33 PM  Pager: [phone]

## 2017-06-19 NOTE — Progress Notes (Signed)
[facility] - [place] [facility]   PATIENT NAME: Francina AmesGrady Kingsley    MR#:  [id number]  DATE OF BIRTH:  1930-11-09  SUBJECTIVE:  CHIEF COMPLAINT:   Chief Complaint  Patient presents with  . Shortness of Breath  Wife at the bedside, patient is a little better, continue tachycardia noted, clinically appears to be mildly dehydrated, noted blood count of 8.4, transfuse 1 unit packed red blood cells, gentle IV fluids for rehydration over the next 12 hours, cardiology input appreciated  REVIEW OF SYSTEMS:  CONSTITUTIONAL: No fever, fatigue or weakness.  EYES: No blurred or double vision.  EARS, NOSE, AND THROAT: No tinnitus or ear pain.  RESPIRATORY: No cough, shortness of breath, wheezing or hemoptysis.  CARDIOVASCULAR: No chest pain, orthopnea, edema.  GASTROINTESTINAL: No nausea, vomiting, diarrhea or abdominal pain.  GENITOURINARY: No dysuria, hematuria.  ENDOCRINE: No polyuria, nocturia,  HEMATOLOGY: No anemia, easy bruising or bleeding SKIN: No rash or lesion. MUSCULOSKELETAL: No joint pain or arthritis.   NEUROLOGIC: No tingling, numbness, weakness.  PSYCHIATRY: No anxiety or depression.   ROS  DRUG ALLERGIES:  No Known Allergies  VITALS:  Blood pressure (!) 112/54, pulse (!) 117, temperature 98 F (36.7 C), temperature source Oral, resp. rate 17, height 5\' 9"  (1.753 m), weight 95.3 kg (210 lb), SpO2 94 %.  PHYSICAL EXAMINATION:  GENERAL:  84 y.o.-year-old patient lying in the bed with no acute distress.  EYES: Pupils equal, round, reactive to light and accommodation. No scleral icterus. Extraocular muscles intact.  HEENT: Head atraumatic, normocephalic. Oropharynx and nasopharynx clear.  NECK:  Supple, no jugular venous distention. No thyroid enlargement, no tenderness.  LUNGS: Normal breath sounds bilaterally, no wheezing, rales,rhonchi or crepitation. No use of accessory muscles of respiration.  CARDIOVASCULAR: S1, S2 normal. No murmurs, rubs, or gallops.   ABDOMEN: Soft, nontender, nondistended. Bowel sounds present. No organomegaly or mass.  EXTREMITIES: No pedal edema, cyanosis, or clubbing.  NEUROLOGIC: Cranial nerves II through XII are intact. Muscle strength 5/5 in all extremities. Sensation intact. Gait not checked.  PSYCHIATRIC: The patient is alert and oriented x 3.  SKIN: No obvious rash, lesion, or ulcer.   Physical Exam LABORATORY PANEL:   CBC Recent Labs  Lab 06/19/17 0703  WBC 6.3  HGB 8.5*  HCT 26.4*  PLT 166   ------------------------------------------------------------------------------------------------------------------  Chemistries  Recent Labs  Lab 06/18/17 0236  NA 140  K 4.2  CL 93*  CO2 40*  GLUCOSE 105*  BUN 62*  CREATININE 2.04*  CALCIUM 8.2*   ------------------------------------------------------------------------------------------------------------------  Cardiac Enzymes Recent Labs  Lab 06/17/17 1449 06/17/17 2102  TROPONINI 0.28* 0.27*   ------------------------------------------------------------------------------------------------------------------  RADIOLOGY:  Nm Pulmonary Perf And Vent  Result Date: 06/18/2017 CLINICAL DATA:  Elevated D-dimer. The patient suffered a fall 06/13/2017 with a right humerus fracture. EXAM: NUCLEAR MEDICINE VENTILATION - PERFUSION LUNG SCAN TECHNIQUE: Ventilation images were obtained in multiple projections using inhaled aerosol Tc-7781m DTPA. Perfusion images were obtained in multiple projections after intravenous injection of Tc-1281m MAA. RADIOPHARMACEUTICALS:  32.61 mCi of Tc-7581m DTPA aerosol inhalation and 4.34 mCi Tc481m MAA-IV COMPARISON:  Single-view of the chest 06/17/2017 and 04/02/2017. CT chest 04/01/2017. FINDINGS: Ventilation: Radiotracer distribution is patchy likely due to technical factors. Decreased lung volume on the left is consistent with elevated left hemidiaphragm seen on prior plain film. Perfusion: No wedge shaped peripheral perfusion  defects to suggest acute pulmonary embolism. IMPRESSION: Negative for pulmonary embolus. Electronically Signed   By: Drusilla Kannerhomas  Dalessio M.D.   On:  06/18/2017 13:10   Dg Chest Port 1 View  Result Date: 06/17/2017 CLINICAL DATA:  Hypoxia. EXAM: PORTABLE CHEST 1 VIEW COMPARISON:  06/15/2017 FINDINGS: Enlarged cardiac silhouette. Calcific atherosclerotic disease of the aorta. Left lower lobe atelectasis versus airspace consolidation with increasing volume loss. 10 mm left upper lobe pulmonary nodule versus focal airspace consolidation. Osseous structures are without acute abnormality. Soft tissues are grossly normal. IMPRESSION: Left lower lobe atelectasis versus airspace consolidation with increasing volume loss. Possible small left pleural effusion. 10 mm left upper lobe pulmonary nodule versus focal airspace consolidation. Enlarged cardiac silhouette. Electronically Signed   By: Ted Mcalpine M.D.   On: 06/17/2017 15:48    ASSESSMENT AND PLAN:  1 acute on chronic hypoxic respiratory failure secondary to HCAP Resolving Continue empiric vancomycin/cefepime, breathing treatments as needed, follow-up on cultures, VQ scan negative for PE, and continue close medical monitoring   2 acute  HCAP Resolving  Plan of care per above   3 recent right shoulder repair Stable PT following  4 acute elevated troponins Cardiology input appreciated-most likely due to demand ischemia  5 acute sinus tachycardia Most likely secondary to respiratory failure and pneumonia Cardiology input appreciated-to increase beta-blocker therapy, transfuse 1 unit packed red blood cells given probable symptomatic anemia, vitals per routine, make changes as per necessary  6 CKD, III Stable  Avoid nephrotoxic agents   7 acute hyperkalemia  Resolved   All the records are reviewed and case discussed with Care Management/Social Workerr. Management plans discussed with the patient, family and they are in agreement.  CODE  STATUS:full  TOTAL TIME TAKING CARE OF THIS PATIENT: 35 minutes.     POSSIBLE D/C IN 1-3 DAYS, DEPENDING ON CLINICAL CONDITION.   Evelena Asa Jamarria Real M.D on 06/19/2017   Between 7am to 6pm - Pager - [phone]  After 6pm go to [email or web address] - password [email or web address] [facility]  [facility]  Office  [phone]  CC: Primary care physician; Maple Hudson., MD  Note: This dictation was prepared with Dragon dictation along with smaller phrase technology. Any transcriptional errors that result from this process are unintentional.

## 2017-06-19 NOTE — [place] FL2 (Signed)
[place] MEDICAID FL2 LEVEL OF CARE SCREENING TOOL     IDENTIFICATION  Patient Name: Jack Jenkins Birthdate: 1930-05-31 Sex: male Admission Date (Current Location): 06/17/2017  [place]   Facility and Address:  [facility], [address], [place], [place] [address]      Provider Number: [id number]  Attending Physician Name and Address:  Bertrum SolSalary, Montell D, MD  Relative Name and Phone Number:  Lawerance Cruelatricia Aken (Spouse) [phone]    Current Level of Care: Hospital Recommended Level of Care: Skilled Nursing Facility Prior Approval Number:    Date Approved/Denied:   PASRR Number: [id number]  Discharge Plan: SNF    Current Diagnoses: Patient Active Problem List   Diagnosis Date Noted  . Pneumonia 06/17/2017  . Humerus fracture 06/14/2017  . Dependence on supplemental oxygen 06/07/2017  . Interstitial pulmonary disease (HCC) 06/07/2017  . Mycobacterial infection 06/07/2017  . Chronic respiratory failure with hypoxia (HCC) 06/07/2017  . Pulmonary fibrosis (HCC) 06/07/2017  . 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 Sleep apnea 06/07/2017  . Bronchiolectasis (HCC) 06/07/2017  . COPD (chronic obstructive pulmonary disease) (HCC) 04/01/2017  . Benign essential HTN 11/24/2016  . Chronic diastolic CHF (congestive heart failure), NYHA class 3 (HCC) 11/24/2016  . Hyperlipidemia, mixed 10/15/2016  . Chest pain 10/07/2016  . Acute respiratory failure (HCC) 04/21/2015  . Dyspnea 04/21/2015  . COPD exacerbation (HCC) 04/21/2015  . Abnormal chest CT 04/21/2015  . CKD (chronic kidney disease) 12/17/2014  . Photokeratitis 11/06/2014  . Allergic rhinitis 11/06/2014  . AB (asthmatic bronchitis) 11/06/2014  . CCF (congestive cardiac failure) (HCC) 11/06/2014  . Colon polyp 11/06/2014  . Urinary system disease 11/06/2014  . ED (erectile dysfunction) of organic origin 11/06/2014  . Acid reflux 11/06/2014  . Acute  gouty arthropathy 11/06/2014  . Gout 11/06/2014  . Arthritis, degenerative 11/06/2014  . Fast heart beat 11/06/2014    Orientation RESPIRATION BLADDER Height & Weight     Self, Time, Situation, Place  O2(3L o2) Continent Weight: 210 lb (95.3 kg) Height:  5\' 9"  (175.3 cm)  BEHAVIORAL SYMPTOMS/MOOD NEUROLOGICAL BOWEL NUTRITION STATUS      Continent Diet(Heart healthy)  AMBULATORY STATUS COMMUNICATION OF NEEDS Skin   Extensive Assist Verbally Normal                       Personal Care Assistance Level of Assistance  Bathing, Feeding, Dressing Bathing Assistance: Limited assistance Feeding assistance: Independent Dressing Assistance: Limited assistance     Functional Limitations Info    Sight Info: Adequate Hearing Info: Adequate Speech Info: Adequate    SPECIAL CARE FACTORS FREQUENCY  PT (By licensed PT), OT (By licensed OT)     PT Frequency: Up to 5/week OT Frequency: Up to 5/week            Contractures Contractures Info: Not present    Additional Factors Info  Code Status, Allergies Code Status Info: Full Allergies Info: No Known Allergies           Current Medications (06/19/2017):  This is the current hospital active medication list Current Facility-Administered Medications  Medication Dose Route Frequency Provider Last Rate Last Dose  . 0.9 %  sodium chloride infusion  250 mL Intravenous PRN Shaune Pollackhen, Qing, MD      . acetaminophen (TYLENOL) tablet 650 mg  650 mg Oral Q6H PRN Shaune Pollackhen, Qing, MD       Or  . acetaminophen (TYLENOL) suppository 650 mg  650 mg Rectal Q6H PRN Shaune Pollack, MD      . acetaminophen (TYLENOL) tablet 650 mg  650 mg Oral Once Salary, Jetty Duhamel D, MD      . albuterol (PROVENTIL) (2.5 MG/3ML) 0.083% nebulizer solution 2.5 mg  2.5 mg Nebulization Q2H PRN Shaune Pollack, MD   2.5 mg at 06/18/17 0016  . allopurinol (ZYLOPRIM) tablet 100 mg  100 mg Oral Daily Shaune Pollack, MD   100 mg at 06/19/17 1213  . azithromycin [facility]) tablet 250 mg  250 mg  Oral Once per day on Mon Wed Fri Chen, Qing, MD   250 mg at 06/18/17 0909  . bisacodyl (DULCOLAX) EC tablet 5 mg  5 mg Oral Daily PRN Shaune Pollack, MD      . ceFEPIme (MAXIPIME) 2 g in dextrose 5 % 50 mL IVPB  2 g Intravenous Q24H Coffee, Gerre Pebbles, [facility]   Stopped at 06/18/17 1851  . cholecalciferol (VITAMIN D) tablet 4,000 Units  4,000 Units Oral Daily Shaune Pollack, MD   4,000 Units at 06/19/17 1213  . colchicine tablet 0.6 mg  0.6 mg Oral Daily Shaune Pollack, MD   0.6 mg at 06/19/17 1213  . diphenhydrAMINE (BENADRYL) capsule 25 mg  25 mg Oral Once Salary, Montell D, MD      . furosemide (LASIX) injection 20 mg  20 mg Intravenous Once Salary, Montell D, MD      . furosemide (LASIX) tablet 40 mg  40 mg Oral Q M,W,F Shaune Pollack, MD   40 mg at 06/18/17 0909  . guaiFENesin (MUCINEX) 12 hr tablet 600 mg  600 mg Oral BID PRN Shaune Pollack, MD      . HYDROcodone-acetaminophen (NORCO/VICODIN) 5-325 MG per tablet 1-2 tablet  1-2 tablet Oral Q4H PRN Shaune Pollack, MD      . ipratropium-albuterol (DUONEB) 0.5-2.5 (3) MG/3ML nebulizer solution 3 mL  3 mL Nebulization Q6H Shaune Pollack, MD   3 mL at 06/19/17 0731  . lactated ringers infusion   Intravenous Continuous Salary, Montell D, MD      . Melene Muller ON 06/20/2017] metoprolol succinate (TOPROL-XL) 24 hr tablet 50 mg  50 mg Oral Daily Dalia Heading, MD      . nitroGLYCERIN (NITROSTAT) SL tablet 0.4 mg  0.4 mg Sublingual Q5 min PRN Shaune Pollack, MD      . ondansetron [facility]) tablet 4 mg  4 mg Oral Q6H PRN Shaune Pollack, MD       Or  . ondansetron [facility]) injection 4 mg  4 mg Intravenous Q6H PRN Shaune Pollack, MD      . rifampin (RIFADIN) capsule 600 mg  600 mg Oral Once per day on Mon Wed Fri Chen, Qing, MD   600 mg at 06/18/17 4098  . senna-docusate (Senokot-S) tablet 1 tablet  1 tablet Oral QHS PRN Shaune Pollack, MD      . sodium chloride flush (NS) 0.9 % injection 3 mL  3 mL Intravenous Q12H Shaune Pollack, MD   3 mL at 06/19/17 1213  . sodium chloride flush (NS) 0.9 % injection 3 mL  3  mL Intravenous PRN Shaune Pollack, MD      . vancomycin (VANCOCIN) IVPB 1000 mg/200 mL premix  1,000 mg Intravenous Q24H Coffee, Gerre Pebbles [facility]   Stopped at 06/19/17 0112     Discharge Medications: Please see discharge summary for a list of discharge medications.  Relevant Imaging Results:  Relevant Lab Results:   Additional Information SS# [id number]  Judi Cong, LCSW

## 2017-06-20 LAB — TYPE AND SCREEN
ABO/RH(D): O POS
ANTIBODY SCREEN: NEGATIVE
Unit division: 0

## 2017-06-20 LAB — BPAM RBC
BLOOD PRODUCT EXPIRATION DATE: 201903092359
ISSUE DATE / TIME: 201902091705
Unit Type and Rh: 5100

## 2017-06-20 LAB — CBC WITH DIFFERENTIAL/PLATELET
BASOS ABS: 0 10*3/uL (ref 0–0.1)
Basophils Relative: 1 %
Eosinophils Absolute: 0.3 10*3/uL (ref 0–0.7)
Eosinophils Relative: 4 %
HCT: 30.3 % — ABNORMAL LOW (ref 40.0–52.0)
Hemoglobin: 9.9 g/dL — ABNORMAL LOW (ref 13.0–18.0)
LYMPHS PCT: 9 %
Lymphs Abs: 0.5 10*3/uL — ABNORMAL LOW (ref 1.0–3.6)
MCH: 31.5 pg (ref 26.0–34.0)
MCHC: 32.7 g/dL (ref 32.0–36.0)
MCV: 96.4 fL (ref 80.0–100.0)
Monocytes Absolute: 1.4 10*3/uL — ABNORMAL HIGH (ref 0.2–1.0)
Monocytes Relative: 22 %
NEUTROS ABS: 4.1 10*3/uL (ref 1.4–6.5)
Neutrophils Relative %: 64 %
PLATELETS: 165 10*3/uL (ref 150–440)
RBC: 3.14 MIL/uL — AB (ref 4.40–5.90)
RDW: 15.8 % — ABNORMAL HIGH (ref 11.5–14.5)
WBC: 6.3 10*3/uL (ref 3.8–10.6)

## 2017-06-20 LAB — CREATININE, SERUM
Creatinine, Ser: 1.71 mg/dL — ABNORMAL HIGH (ref 0.61–1.24)
GFR calc non Af Amer: 35 mL/min — ABNORMAL LOW (ref >60)
GFR, EST AFRICAN AMERICAN: 40 mL/min — AB (ref >60)

## 2017-06-20 MED ORDER — LACTATED RINGERS IV BOLUS (SEPSIS)
500.0000 mL | Freq: Once | INTRAVENOUS | Status: AC
  Administered 2017-06-20: 500 mL via INTRAVENOUS

## 2017-06-20 MED ORDER — LACTATED RINGERS IV SOLN
INTRAVENOUS | Status: DC
  Administered 2017-06-20 – 2017-06-21 (×3): via INTRAVENOUS

## 2017-06-20 MED ORDER — MEGESTROL ACETATE 40 MG/ML PO SUSP
400.0000 mg | Freq: Two times a day (BID) | ORAL | Status: DC
  Administered 2017-06-20 – 2017-06-22 (×5): 400 mg via ORAL
  Filled 2017-06-20 (×6): qty 10

## 2017-06-20 NOTE — Evaluation (Signed)
Physical Therapy Evaluation Patient Details Name: Jack Jenkins A Valencia MRN: [id number] DOB: 1930/12/20 Today's Date: 06/20/2017   History of Present Illness  84 y.o.malereadmitted with PNA and difficulty with hypoxia, elevated pulses and persisting severe R arm pain. Dehydrated, weak, SOB, unable to mobilize. Had been sent back from SNF where he was just discharged. PMHx:  CHF, COPD, gout, renal insufficiency, falls, acute hypernatremia, MAI lung infection, CKD 3,   Clinical Impression  Pt is working with PT today with better O2 sats, better pulses and increased tolerance for activity.  He is motivated to get up to chair with sats declining at times but stable with 97% at end of session in chair.  Pt's wife was nearby to discuss plan of treatment and will expect him to resume therapy in SNF when he is able to medically transfer.  Sling on R arm was refitted yesterday and pt reports a significant decrease in pain from the transition.  Follow acutely to decrease need to stay in SNF and promote better energy, safety with movement and increased endurance.    Follow Up Recommendations SNF    Equipment Recommendations  None recommended by PT;Other (comment)    Recommendations for Other Services       Precautions / Restrictions Precautions Precautions: Fall(telemetry, R humeral stabilizing support) Restrictions Weight Bearing Restrictions: Yes RUE Weight Bearing: Non weight bearing Other Position/Activity Restrictions: Pt may perform RUE elbow, wrist and hand ROM and remove sling for hygiene      Mobility  Bed Mobility Overal bed mobility: Needs Assistance Bed Mobility: Supine to Sit     Supine to sit: Mod assist     General bed mobility comments: better now with trunk support to sit up and minor help with LE's  Transfers Overall transfer level: Needs assistance Equipment used: 1 person hand held assist Transfers: Lateral/Scoot Transfers          Lateral/Scoot Transfers: Mod  assist;From elevated surface General transfer comment: able to scoot to chair with small increments and then supported RUE wiht pillow under sling  Ambulation/Gait             General Gait Details: unable to walk  Stairs            Wheelchair Mobility    Modified Rankin (Stroke Patients Only)       Balance Overall balance assessment: Needs assistance Sitting-balance support: Feet supported;Single extremity supported Sitting balance-Leahy Scale: Good     Standing balance support: (unable)                                 Pertinent Vitals/Pain Pain Assessment: Faces Faces Pain Scale: Hurts little more Pain Location: R upper arm Pain Descriptors / Indicators: Tender;Sore Pain Intervention(s): Limited activity within patient's tolerance;Monitored during session;Premedicated before session;Repositioned    Home Living Family/patient expects to be discharged to:: Skilled nursing facility Living Arrangements: Spouse/significant other Available Help at Discharge: Family;Available 24 hours/day Type of Home: House Home Access: Level entry     Home Layout: One level Home Equipment: Cane - single point;Walker - 2 wheels;Walker - 4 wheels;Wheelchair - manual Additional Comments: Pt and spouse both report home is handicap accessible    Prior Function Level of Independence: Independent with assistive device(s)         Comments: SPC or RW previously with no other falls     Hand Dominance   Dominant Hand: Right    Extremity/Trunk Assessment  Upper Extremity Assessment Upper Extremity Assessment: Defer to OT evaluation    Lower Extremity Assessment Lower Extremity Assessment: Generalized weakness    Cervical / Trunk Assessment Cervical / Trunk Assessment: Normal  Communication   Communication: No difficulties  Cognition Arousal/Alertness: Awake/alert Behavior During Therapy: WFL for tasks assessed/performed Overall Cognitive Status: Within  Functional Limits for tasks assessed                                        General Comments General comments (skin integrity, edema, etc.): Pt has mult areas of bruising and pain from fall including ribcage, was able to scoot him once started with relatively comfortable transition, nursing stood by to assist if needed    Exercises     Assessment/Plan    PT Assessment Patient needs continued PT services  PT Problem List Decreased strength;Decreased range of motion;Decreased activity tolerance;Decreased balance;Decreased mobility;Decreased coordination;Decreased knowledge of use of DME;Decreased safety awareness;Cardiopulmonary status limiting activity;Decreased skin integrity;Pain       PT Treatment Interventions DME instruction;Gait training;Functional mobility training;Therapeutic activities;Therapeutic exercise;Balance training;Neuromuscular re-education;Patient/family education    PT Goals (Current goals can be found in the Care Plan section)  Acute Rehab PT Goals Patient Stated Goal: to feel better and get stronger PT Goal Formulation: With patient/family Time For Goal Achievement: 07/04/17 Potential to Achieve Goals: Good    Frequency Min 2X/week   Barriers to discharge Inaccessible home environment;Decreased caregiver support wife is sole caregiver    Co-evaluation               AM-PAC PT "6 Clicks" Daily Activity  Outcome Measure Difficulty turning over in bed (including adjusting bedclothes, sheets and blankets)?: Unable Difficulty moving from lying on back to sitting on the side of the bed? : Unable Difficulty sitting down on and standing up from a chair with arms (e.g., wheelchair, bedside commode, etc,.)?: Unable Help needed moving to and from a bed to chair (including a wheelchair)?: A Lot Help needed walking in hospital room?: Total Help needed climbing 3-5 steps with a railing? : Total 6 Click Score: 7    End of Session Equipment  Utilized During Treatment: Oxygen;Gait belt Activity Tolerance: Patient limited by fatigue;Patient limited by pain;Treatment limited secondary to medical complications (Comment)(monitored O2 sats with starting at 89% then to 86%, up to 97) Patient left: in chair;with call bell/phone within reach;with chair alarm set;with nursing/sitter in room Nurse Communication: Mobility status PT Visit Diagnosis: Unsteadiness on feet (R26.81);Muscle weakness (generalized) (M62.81);History of falling (Z91.81);Difficulty in walking, not elsewhere classified (R26.2);Pain;Adult, failure to thrive (R62.7) Pain - Right/Left: Right Pain - part of body: Arm    Time: 1449-1515 PT Time Calculation (min) (ACUTE ONLY): 26 min   Charges:   PT Evaluation $PT Eval Moderate Complexity: 1 Mod PT Treatments $Therapeutic Activity: 8-22 mins   PT G Codes:   PT G-Codes **NOT FOR INPATIENT CLASS** Functional Assessment Tool Used: AM-PAC 6 Clicks Basic Mobility   Ivar Drape 06/20/2017, 6:10 PM   Samul Dada, PT MS Acute Rehab Dept. Number: [facility] [id number] and [facility] [phone]

## 2017-06-20 NOTE — Progress Notes (Signed)
Pharmacy Antibiotic Note  Jack Jenkins is a 84 y.o. male admitted on 06/17/2017 with pneumonia.  Pharmacy has been consulted for vancomycin dosing.  Plan: Vancomycin 1000mg  IV every 24 hours.  Goal trough 15-20 mcg/mL. cefepime 2gm iv q24h    2/10:  Scr improved. PK: Ke 0.031  T1/2 22.36  Vd 56.3. Will continue current Vancomycin regimen Will adjust Cefepime to 2 gram Q12h.    Height: 5\' 9"  (175.3 cm) Weight: 210 lb (95.3 kg) IBW/kg (Calculated) : 70.7  Temp (24hrs), Avg:98 F (36.7 C), Min:97.6 F (36.4 C), Max:98.1 F (36.7 C)  Recent Labs  Lab 06/14/17 0432 06/15/17 0442 06/16/17 0309 06/17/17 1449 06/18/17 0236 06/19/17 0703 06/20/17 0437  WBC 11.7*  --   --  8.5 9.7 6.3 6.3  CREATININE 1.65* 2.44* 2.14* 2.21* 2.04*  --  1.71*  LATICACIDVEN  --   --   --  1.4  --   --   --     Estimated Creatinine Clearance: 36 mL/min (A) (by C-G formula based on SCr of 1.71 mg/dL (H)).    No Known Allergies  Antimicrobials this admission: Anti-infectives (From admission, onward)   Start     Dose/Rate Route Frequency Ordered Stop   06/18/17 1700  ceFEPIme (MAXIPIME) 2 g in dextrose 5 % 50 mL IVPB     2 g 100 mL/hr over 30 Minutes Intravenous Every 24 hours 06/17/17 1832     06/18/17 0900  azithromycin (ZITHROMAX) tablet 250 mg    Comments:  Take 1 tablet On Monday Wednesday and Friday     250 mg Oral Once per day on Mon Wed Fri 06/17/17 2020     06/18/17 0900  rifampin (RIFADIN) capsule 600 mg    Comments:  On Monday Wednesday and Friday     600 mg Oral Once per day on Mon Wed Fri 06/17/17 2020     06/18/17 0100  vancomycin (VANCOCIN) IVPB 1000 mg/200 mL premix     1,000 mg 200 mL/hr over 60 Minutes Intravenous Every 24 hours 06/17/17 1833     06/17/17 1745  ceFEPIme (MAXIPIME) 2 g in dextrose 5 % 50 mL IVPB     2 g 100 mL/hr over 30 Minutes Intravenous  Once 06/17/17 1736 06/17/17 1848   06/17/17 1745  vancomycin (VANCOCIN) IVPB 1000 mg/200 mL premix     1,000 mg 200  mL/hr over 60 Minutes Intravenous  Once 06/17/17 1736 06/17/17 1957      Microbiology results: Recent Results (from the past 240 hour(s))  Blood culture (routine x 2)     Status: None (Preliminary result)   Collection Time: 06/17/17  2:50 PM  Result Value Ref Range Status   Specimen Description BLOOD LAC  Final   Special Requests   Final    BOTTLES DRAWN AEROBIC AND ANAEROBIC Blood Culture adequate volume   Culture   Final    NO GROWTH 2 DAYS Performed at [facility], [address] [place], [place] [address]    Report Status PENDING  Incomplete  Blood culture (routine x 2)     Status: None (Preliminary result)   Collection Time: 06/17/17  2:50 PM  Result Value Ref Range Status   Specimen Description BLOOD LFOA  Final   Special Requests   Final    BOTTLES DRAWN AEROBIC AND ANAEROBIC Blood Culture adequate volume   Culture   Final    NO GROWTH 2 DAYS Performed at [facility], [address]  [place], [place] [address]    Report Status PENDING  Incomplete  MRSA PCR Screening     Status: None   Collection Time: 06/17/17  6:32 PM  Result Value Ref Range Status   MRSA by PCR NEGATIVE NEGATIVE Final    Comment:        The GeneXpert MRSA Assay (FDA approved for NASAL specimens only), is one component of a comprehensive MRSA colonization surveillance program. It is not intended to diagnose MRSA infection nor to guide or monitor treatment for MRSA infections. Performed at [facility], [address], [place], [place] [address]      Thank you for allowing pharmacy to be a part of this patient's care.  Honestie Kulik A 06/20/2017 2:02 PM

## 2017-06-20 NOTE — Progress Notes (Signed)
[facility] [place] [facility]   PATIENT NAME: Francina AmesGrady Deike    MR#:  [id number]  DATE OF BIRTH:  1930-07-15  SUBJECTIVE:  CHIEF COMPLAINT:   Chief Complaint  Patient presents with  . Shortness of Breath  Complains of generalized weakness, fatigue, noted tachycardia continues, patient appears clinically dehydrated with severely dry mucous membranes hemoglobin improved status post transfusion, family concern for weakness/right arm pain/poor appetite/poor mobility, will start IV fluids for rehydration, encourage p.o. intake, Megace twice daily, dietary to see, physical therapy later today  REVIEW OF SYSTEMS:  CONSTITUTIONAL: No fever, fatigue or weakness.  EYES: No blurred or double vision.  EARS, NOSE, AND THROAT: No tinnitus or ear pain.  RESPIRATORY: No cough, shortness of breath, wheezing or hemoptysis.  CARDIOVASCULAR: No chest pain, orthopnea, edema.  GASTROINTESTINAL: No nausea, vomiting, diarrhea or abdominal pain.  GENITOURINARY: No dysuria, hematuria.  ENDOCRINE: No polyuria, nocturia,  HEMATOLOGY: No anemia, easy bruising or bleeding SKIN: No rash or lesion. MUSCULOSKELETAL: No joint pain or arthritis.   NEUROLOGIC: No tingling, numbness, weakness.  PSYCHIATRY: No anxiety or depression.   ROS  DRUG ALLERGIES:  No Known Allergies  VITALS:  Blood pressure (!) 124/50, pulse (!) 109, temperature 98 F (36.7 C), temperature source Oral, resp. rate 16, height 5\' 9"  (1.753 m), weight 95.3 kg (210 lb), SpO2 95 %.  PHYSICAL EXAMINATION:  GENERAL:  84 y.o.-year-old patient lying in the bed with no acute distress.  EYES: Pupils equal, round, reactive to light and accommodation. No scleral icterus. Extraocular muscles intact.  HEENT: Head atraumatic, normocephalic. Oropharynx and nasopharynx clear.  NECK:  Supple, no jugular venous distention. No thyroid enlargement, no tenderness.  LUNGS: Normal breath sounds bilaterally, no wheezing, rales,rhonchi or  crepitation. No use of accessory muscles of respiration.  CARDIOVASCULAR: S1, S2 normal. No murmurs, rubs, or gallops.  ABDOMEN: Soft, nontender, nondistended. Bowel sounds present. No organomegaly or mass.  EXTREMITIES: No pedal edema, cyanosis, or clubbing.  NEUROLOGIC: Cranial nerves II through XII are intact. Muscle strength 5/5 in all extremities. Sensation intact. Gait not checked.  PSYCHIATRIC: The patient is alert and oriented x 3.  SKIN: No obvious rash, lesion, or ulcer.   Physical Exam LABORATORY PANEL:   CBC Recent Labs  Lab 06/20/17 0437  WBC 6.3  HGB 9.9*  HCT 30.3*  PLT 165   ------------------------------------------------------------------------------------------------------------------  Chemistries  Recent Labs  Lab 06/18/17 0236 06/20/17 0437  NA 140  --   K 4.2  --   CL 93*  --   CO2 40*  --   GLUCOSE 105*  --   BUN 62*  --   CREATININE 2.04* 1.71*  CALCIUM 8.2*  --    ------------------------------------------------------------------------------------------------------------------  Cardiac Enzymes Recent Labs  Lab 06/17/17 1449 06/17/17 2102  TROPONINI 0.28* 0.27*   ------------------------------------------------------------------------------------------------------------------  RADIOLOGY:  No results found.  ASSESSMENT AND PLAN:  1 acute on chronic hypoxic respiratory failure secondary to HCAP Resolved Continue empiric vancomycin/cefepime, BTs prn, VQ scan neg. for PE, successfully weaned to baseline O2 requirement of 3-4 L continuous which is his home dose,   2 acute  HCAP Resolving  Plan of care per above   3 recent right shoulder repair Stable Continue adult pain protocol PT following  4 acute elevated troponins Cardiology input appreciated-most likely due to demand ischemia  5 acute sinus tachycardia Remains persistent-suspect related to dehydration/hypovolemia Most likely secondary to respiratory failure and  pneumonia Cardiology input appreciated-increased beta-blocker therapy  We will start LR  at maintenance IV fluids for 16 hours started with 500 cc bolus, hold Lasix, s/p 1 PRBC transfusion,, and make changes as per necessary  6 CKD, III Stable  Avoid nephrotoxic agents   7 acute hyperkalemia  Resolved   All the records are reviewed and case discussed with Care Management/Social Workerr. Management plans discussed with the patient, family and they are in agreement.  CODE STATUS: full  TOTAL TIME TAKING CARE OF THIS PATIENT: 35 minutes.     POSSIBLE D/C IN 1-2 DAYS, DEPENDING ON CLINICAL CONDITION.   Evelena Asa Sevin Langenbach M.D on 06/20/2017   Between 7am to 6pm - Pager - [phone]  After 6pm go to [email or web address] - password [email or web address] [facility]  [facility]  Office  [phone]  CC: Primary care physician; Maple Hudson., MD  Note: This dictation was prepared with Dragon dictation along with smaller phrase technology. Any transcriptional errors that result from this process are unintentional.

## 2017-06-21 ENCOUNTER — Inpatient Hospital Stay: Payer: Medicare Other

## 2017-06-21 LAB — EXPECTORATED SPUTUM ASSESSMENT W GRAM STAIN, RFLX TO RESP C

## 2017-06-21 LAB — BASIC METABOLIC PANEL
Anion gap: 8 (ref 5–15)
BUN: 44 mg/dL — ABNORMAL HIGH (ref 6–20)
CHLORIDE: 97 mmol/L — AB (ref 101–111)
CO2: 37 mmol/L — AB (ref 22–32)
Calcium: 8.2 mg/dL — ABNORMAL LOW (ref 8.9–10.3)
Creatinine, Ser: 1.53 mg/dL — ABNORMAL HIGH (ref 0.61–1.24)
GFR calc Af Amer: 46 mL/min — ABNORMAL LOW (ref >60)
GFR calc non Af Amer: 40 mL/min — ABNORMAL LOW (ref >60)
Glucose, Bld: 103 mg/dL — ABNORMAL HIGH (ref 65–99)
POTASSIUM: 3.8 mmol/L (ref 3.5–5.1)
Sodium: 142 mmol/L (ref 135–145)

## 2017-06-21 LAB — PREALBUMIN: Prealbumin: 5.9 mg/dL — ABNORMAL LOW (ref 18–38)

## 2017-06-21 LAB — EXPECTORATED SPUTUM ASSESSMENT W REFEX TO RESP CULTURE

## 2017-06-21 MED ORDER — SODIUM CHLORIDE 0.9 % IV SOLN
2.0000 g | INTRAVENOUS | Status: DC
  Administered 2017-06-21: 2 g via INTRAVENOUS
  Filled 2017-06-21 (×2): qty 2

## 2017-06-21 MED ORDER — POLYVINYL ALCOHOL 1.4 % OP SOLN
1.0000 [drp] | OPHTHALMIC | Status: DC | PRN
  Filled 2017-06-21: qty 15

## 2017-06-21 MED ORDER — METOPROLOL SUCCINATE ER 25 MG PO TB24
75.0000 mg | ORAL_TABLET | Freq: Every day | ORAL | Status: DC
  Administered 2017-06-22: 75 mg via ORAL
  Filled 2017-06-21: qty 3

## 2017-06-21 MED ORDER — ENSURE ENLIVE PO LIQD
237.0000 mL | Freq: Two times a day (BID) | ORAL | Status: DC
  Administered 2017-06-22 (×2): 237 mL via ORAL

## 2017-06-21 MED ORDER — MIDODRINE HCL 5 MG PO TABS
10.0000 mg | ORAL_TABLET | Freq: Three times a day (TID) | ORAL | Status: DC
  Administered 2017-06-21 – 2017-06-22 (×3): 10 mg via ORAL
  Filled 2017-06-21 (×4): qty 2

## 2017-06-21 MED ORDER — HEPARIN SODIUM (PORCINE) 5000 UNIT/ML IJ SOLN
5000.0000 [IU] | Freq: Three times a day (TID) | INTRAMUSCULAR | Status: DC
  Administered 2017-06-21 – 2017-06-22 (×4): 5000 [IU] via SUBCUTANEOUS
  Filled 2017-06-21 (×4): qty 1

## 2017-06-21 NOTE — Progress Notes (Signed)
[facility] [place] at [facility]   PATIENT NAME: Jack Jenkins    MR#:  [id number]  DATE OF BIRTH:  [birth year]  SUBJECTIVE:  CHIEF COMPLAINT:   Chief Complaint  Patient presents with  . Shortness of Breath  No events overnight, patient successfully weaned to baseline O2 requirement of 3-4 L via nasal cannula, patient appears euvolemic-discontinue IV fluids, physical therapy recommending skilled nursing facility, acute kidney injury is resolving, repeat chest x-ray  REVIEW OF SYSTEMS:  CONSTITUTIONAL: No fever, fatigue or weakness.  EYES: No blurred or double vision.  EARS, NOSE, AND THROAT: No tinnitus or ear pain.  RESPIRATORY: No cough, shortness of breath, wheezing or hemoptysis.  CARDIOVASCULAR: No chest pain, orthopnea, edema.  GASTROINTESTINAL: No nausea, vomiting, diarrhea or abdominal pain.  GENITOURINARY: No dysuria, hematuria.  ENDOCRINE: No polyuria, nocturia,  HEMATOLOGY: No anemia, easy bruising or bleeding SKIN: No rash or lesion. MUSCULOSKELETAL: No joint pain or arthritis.   NEUROLOGIC: No tingling, numbness, weakness.  PSYCHIATRY: No anxiety or depression.   ROS  DRUG ALLERGIES:  No Known Allergies  VITALS:  Blood pressure (!) 115/56, pulse (!) 105, temperature 98 F (36.7 C), temperature source Oral, resp. rate (!) 21, height 5\' 9"  (1.753 m), weight 95.3 kg (210 lb), SpO2 92 %.  PHYSICAL EXAMINATION:  GENERAL:  84 y.o.-year-old patient lying in the bed with no acute distress.  EYES: Pupils equal, round, reactive to light and accommodation. No scleral icterus. Extraocular muscles intact.  HEENT: Head atraumatic, normocephalic. Oropharynx and nasopharynx clear.  NECK:  Supple, no jugular venous distention. No thyroid enlargement, no tenderness.  LUNGS: Normal breath sounds bilaterally, no wheezing, rales,rhonchi or crepitation. No use of accessory muscles of respiration.  CARDIOVASCULAR: S1, S2 normal. No murmurs, rubs, or gallops.   ABDOMEN: Soft, nontender, nondistended. Bowel sounds present. No organomegaly or mass.  EXTREMITIES: No pedal edema, cyanosis, or clubbing.  NEUROLOGIC: Cranial nerves II through XII are intact. Muscle strength 5/5 in all extremities. Sensation intact. Gait not checked.  PSYCHIATRIC: The patient is alert and oriented x 3.  SKIN: No obvious rash, lesion, or ulcer.   Physical Exam LABORATORY PANEL:   CBC Recent Labs  Lab 06/20/17 0437  WBC 6.3  HGB 9.9*  HCT 30.3*  PLT 165   ------------------------------------------------------------------------------------------------------------------  Chemistries  Recent Labs  Lab 06/21/17 0518  NA 142  K 3.8  CL 97*  CO2 37*  GLUCOSE 103*  BUN 44*  CREATININE 1.53*  CALCIUM 8.2*   ------------------------------------------------------------------------------------------------------------------  Cardiac Enzymes Recent Labs  Lab 06/17/17 1449 06/17/17 2102  TROPONINI 0.28* 0.27*   ------------------------------------------------------------------------------------------------------------------  RADIOLOGY:  No results found.  ASSESSMENT AND PLAN:  1acute on chronic hypoxic respiratory failure secondary to HCAP Resolved Continue empiricvancomycin/cefepime/rifampin, BTs prn, VQ scan neg. for PE, successfully weaned to baseline O2 requirement of 3-4 L continuous which is his home dose,  2acute HCAP Resolving  P chest x-ray All other plans as stated above   3recent right shoulder repair Stable Continue adult pain protocol PT recommending skilled nursing facility-tentative discharge plan for on tomorrow   4acute elevated troponins Cardiology input appreciated-most likely due to demand ischemia  5acute sinus tachycardia Improved Most likely secondary to multifactorial process which includes acute pneumonia, dehydration/hypovolemia Most likely secondary to respiratory failure and pneumonia Cardiology input  appreciated-increase BB to 75mg  daily, discontinue IV fluids, continue to hold Lasix, s/p 1 PRBC transfusion  6 CKD, III Stable  Avoid nephrotoxic agents  7acute hyperkalemia  Resolved  All the  records are reviewed and case discussed with Care Management/Social Workerr. Management plans discussed with the patient, family and they are in agreement.  CODE STATUS: full  TOTAL TIME TAKING CARE OF THIS PATIENT: 35 minutes.     POSSIBLE D/C IN 1 DAYS, DEPENDING ON CLINICAL CONDITION.   Jack Jenkins M.D on 06/21/2017   Between 7am to 6pm - Pager - [phone]  After 6pm go to [email or web address] - password [email or web address] [facility]  [facility] [place] [facility]  Office  [phone]  CC: Primary care physician; Maple Hudson., MD  Note: This dictation was prepared with Dragon dictation along with smaller phrase technology. Any transcriptional errors that result from this process are unintentional.

## 2017-06-21 NOTE — Progress Notes (Signed)
Initial Nutrition Assessment  DOCUMENTATION CODES:   Not applicable  INTERVENTION:   Ensure Enlive po BID, each supplement provides 350 kcal and 20 grams of protein  Liberalize diet  NUTRITION DIAGNOSIS:   Increased nutrient needs related to acute illness as evidenced by increased estimated needs from protein.  GOAL:   Patient will meet greater than or equal to 90% of their needs  MONITOR:   PO intake, Supplement acceptance, Weight trends, Labs, I & O's  REASON FOR ASSESSMENT:   Consult Assessment of nutrition requirement/status  ASSESSMENT:   84 y.o. male readmitted with PNA and difficulty with hypoxia, elevated pulses and persisting severe R arm pain. Dehydrated, weak, SOB, unable to mobilize. Had been sent back from SNF where he was just discharged. PMHx:  CHF, COPD, gout, renal insufficiency, falls,  acute hypernatremia, MAI lung infection, CKD 3   Unable to see pt today x 2 visits. Per chart, pt eating 100% of meals and appears to be weight stable pta. RD will obtain nutrition related history and exam at follow-up. RD will order Ensure and liberalize diet to help pt meet his estimated protein needs. No salt packs on pt trays.   Medications reviewed and include: allopurinol, azithromycin, vitamin D, heparin, megace, cefepime  Labs reviewed: Cl 97(L), BUN 44(H), creat 1.53(H), Ca 8.2(L) Prealbumin- 5.9(L)- 2/10 Hgb 9.9(L), Hct 30.3(L)2/10  Unable to complete Nutrition-Focused physical exam at this time.   Diet Order:  Diet regular Room service appropriate? Yes; Fluid consistency: Thin  EDUCATION NEEDS:   Not appropriate for education at this time  Skin:  Skin Assessment: (laceration head)  Last BM:  2/11- type 6  Height:   Ht Readings from Last 1 Encounters:  06/17/17 5\' 9"  (1.753 m)    Weight:   Wt Readings from Last 1 Encounters:  06/17/17 210 lb (95.3 kg)    Ideal Body Weight:  72.7 kg  BMI:  Body mass index is 31.01 kg/m.  Estimated  Nutritional Needs:   Kcal:  1800-2100kcal/day   Protein:  95-114g/day   Fluid:  >1.8L/day   Betsey Holidayasey Aalivia Mcgraw MS, RD, LDN Pager #[phone] After Hours Pager: [phone]

## 2017-06-22 ENCOUNTER — Telehealth: Payer: Self-pay | Admitting: Internal Medicine

## 2017-06-22 LAB — CULTURE, BLOOD (ROUTINE X 2)
Culture: NO GROWTH
Culture: NO GROWTH
SPECIAL REQUESTS: ADEQUATE
Special Requests: ADEQUATE

## 2017-06-22 MED ORDER — ENSURE ENLIVE PO LIQD: 237.0000 mL | Freq: Two times a day (BID) | ORAL | 12 refills | Status: AC

## 2017-06-22 MED ORDER — MIDODRINE HCL 10 MG PO TABS: 10.0000 mg | ORAL_TABLET | Freq: Three times a day (TID) | ORAL | 0 refills | Status: AC

## 2017-06-22 MED ORDER — MEGESTROL ACETATE 40 MG/ML PO SUSP: 400.0000 mg | Freq: Two times a day (BID) | ORAL | 0 refills | Status: AC

## 2017-06-22 MED ORDER — POLYVINYL ALCOHOL 1.4 % OP SOLN: 1.0000 [drp] | OPHTHALMIC | 0 refills | Status: AC | PRN

## 2017-06-22 MED ORDER — MOXIFLOXACIN HCL 400 MG PO TABS: 400.0000 mg | ORAL_TABLET | Freq: Every day | ORAL | 0 refills | Status: AC

## 2017-06-22 MED ORDER — HYDROCODONE-ACETAMINOPHEN 5-325 MG PO TABS: 1.0000 | ORAL_TABLET | ORAL | 0 refills | Status: AC | PRN

## 2017-06-22 MED ORDER — BISACODYL 5 MG PO TBEC: 5.0000 mg | DELAYED_RELEASE_TABLET | Freq: Every day | ORAL | 0 refills | Status: AC | PRN

## 2017-06-22 MED ORDER — METOPROLOL SUCCINATE ER 25 MG PO TB24: 75.0000 mg | ORAL_TABLET | Freq: Every day | ORAL | 0 refills | Status: AC

## 2017-06-22 NOTE — Care Management Important Message (Signed)
Important Message  Patient Details  Name: Jack Jenkins MRN: [id number] Date of Birth: 09/18/1930   Medicare Important Message Given:  Yes    Chapman FitchBOWEN, Hason Ofarrell T, RN 06/22/2017, 11:31 AM

## 2017-06-22 NOTE — Progress Notes (Signed)
PT Cancellation Note  Patient Details Name: Jack Jenkins MRN: [id number] DOB: 12-16-1930   Cancelled Treatment:    Reason Eval/Treat Not Completed: Other (comment)   Pt in bed. Stating he had been up with nursing this am and had recently returned to bed.  Pt and wife state he is awaiting transport to SNF and declined session at this time.  No discharge orders in as of yet.  Will re-check pt this pm as time allows.   Danielle DessSarah Lorik Guo 06/22/2017, 11:52 AM

## 2017-06-22 NOTE — Clinical Social Work Note (Signed)
Patient discharging to [facility] today. Marcelino DusterMichelle at [facility] received [place] and discharge information sent to [facility]. Patient to transport via EMS and wife is in agreement with discharge. York SpanielMonica Winry Egnew MSW,LcSW [phone]

## 2017-06-22 NOTE — Discharge Instructions (Signed)

## 2017-06-22 NOTE — Discharge Summary (Signed)
[facility] [place] at [facility]   PATIENT NAME: Jack Jenkins    MR#:  [id number]  DATE OF BIRTH:  06-09-1930  DATE OF ADMISSION:  06/17/2017 ADMITTING PHYSICIAN: Shaune Pollack, MD  DATE OF DISCHARGE: No discharge date for patient encounter.  PRIMARY CARE PHYSICIAN: Maple Hudson., MD    ADMISSION DIAGNOSIS:  Hypoxia [R09.02] HCAP (healthcare-associated pneumonia) [J18.9]  DISCHARGE DIAGNOSIS:  Active Problems:   Pneumonia   SECONDARY DIAGNOSIS:   Past Medical History:  Diagnosis Date  . CHF (congestive heart failure) (HCC)   . CKD (chronic kidney disease)   . COPD (chronic obstructive pulmonary disease) (HCC)   . Gout   . Hypertension   . Osteoarthritis   . Renal disorder     HOSPITAL COURSE:  1acute on chronic hypoxic respiratory failure secondary to HCAP Resolving Treated with empiricvancomycin/cefepime/rifampin while in house,BTs prn, VQ scan neg.for PE, successfully weaned to baseline O2 requirement of 3-4 L continuous which is his home dose  2acute HCAP Resolving  P chest x-ray All other plans as stated above   3recent right shoulder repair Stable Continue adult pain protocol PT recommending skilled nursing facility-tentative discharge plan for on tomorrow  Orthopedic surgery to see patient while in house-we will have patient follow-up status post discharge in 2-4 weeks for reevaluation  4acute elevated troponins Cardiology did see patient while in house-most likely due to demand ischemia  5acute sinus tachycardia Resolved Most likely secondary to multifactorial process which includes acute pneumonia, dehydration/hypovolemia Most likely secondary to respiratory failure and pneumonia Cardiology did see patient while in house-increased BB to 75mg  daily, discontinue IV fluids, Lasix held while in house, and patient did receive 1 PRBCtransfusion  6 CKD, III Stable  Avoid nephrotoxic agents  7acute  hyperkalemia  Resolved  DISCHARGE CONDITIONS:  On day of discharge patient is afebrile, hemodynamically stable, tolerating diet, ready for discharge to skilled nursing facility for continued care, follow-up with orthopedic surgery as stated above, follow with cardiology regarding tachycardia at 1 week for reevaluation, for more specific details please see chart  CONSULTS OBTAINED:  Treatment Team:  Dalia Heading, MD  DRUG ALLERGIES:  No Known Allergies  DISCHARGE MEDICATIONS:   Allergies as of 06/22/2017   No Known Allergies     Medication List    STOP taking these medications   azithromycin 250 MG tablet Commonly known as:  ZITHROMAX   traMADol 50 MG tablet Commonly known as:  ULTRAM     TAKE these medications   acetaminophen 325 MG tablet Commonly known as:  TYLENOL Take 650 mg by mouth every 6 (six) hours as needed. Pain or increased fever.May be administered orally, per G-tube if needed or rectally if unable to swallow (separate order). Maximum dose for 24 hours is 3,000 mg from all sources of Acetaminophen/ Tylenol   allopurinol 100 MG tablet Commonly known as:  ZYLOPRIM Take 1 tablet (100 mg total) by mouth daily.   bisacodyl 5 MG EC tablet Commonly known as:  DULCOLAX Take 1 tablet (5 mg total) by mouth daily as needed for moderate constipation.   Cholecalciferol 4000 units Caps Take 1 capsule by mouth daily.   colchicine 0.6 MG tablet Take 1 tablet (0.6 mg total) by mouth daily.   feeding supplement (ENSURE ENLIVE) Liqd Take 237 mLs by mouth 2 (two) times daily between meals.   furosemide 40 MG tablet Commonly known as:  LASIX Take 1 tablet (40 mg total) by mouth every other day.  Monday, Wednesday, Friday   guaiFENesin 600 MG 12 hr tablet Commonly known as:  MUCINEX Take 1 tablet (600 mg total) by mouth 2 (two) times daily as needed for cough.   HYDROcodone-acetaminophen 5-325 MG tablet Commonly known as:  NORCO/VICODIN Take 1-2 tablets by mouth  every 4 (four) hours as needed for moderate pain.   ipratropium-albuterol 0.5-2.5 (3) MG/3ML Soln Commonly known as:  DUONEB Take 3 mLs by nebulization every 6 (six) hours.   megestrol 40 MG/ML suspension Commonly known as:  MEGACE Take 10 mLs (400 mg total) by mouth 2 (two) times daily.   metoprolol succinate 25 MG 24 hr tablet Commonly known as:  TOPROL XL Take 3 tablets (75 mg total) by mouth daily. What changed:  how much to take   midodrine 10 MG tablet Commonly known as:  PROAMATINE Take 1 tablet (10 mg total) by mouth 3 (three) times daily with meals.   moxifloxacin 400 MG tablet Commonly known as:  AVELOX Take 1 tablet (400 mg total) by mouth daily for 7 days.   nitroGLYCERIN 0.4 MG SL tablet Commonly known as:  NITROSTAT Place 1 tablet (0.4 mg total) under the tongue every 5 (five) minutes as needed for chest pain.   polyvinyl alcohol 1.4 % ophthalmic solution Commonly known as:  LIQUIFILM TEARS Place 1 drop into the left eye as needed for dry eyes.   rifampin 300 MG capsule Commonly known as:  RIFADIN Take 600 mg by mouth 3 (three) times a week. On Monday Wednesday and Friday        DISCHARGE INSTRUCTIONS:   If you experience worsening of your admission symptoms, develop shortness of breath, life threatening emergency, suicidal or homicidal thoughts you must seek medical attention immediately by calling 911 or calling your MD immediately  if symptoms less severe.  You Must read complete instructions/literature along with all the possible adverse reactions/side effects for all the Medicines you take and that have been prescribed to you. Take any new Medicines after you have completely understood and accept all the possible adverse reactions/side effects.   Please note  You were cared for by a hospitalist during your hospital stay. If you have any questions about your discharge medications or the care you received while you were in the hospital after you are  discharged, you can call the unit and asked to speak with the hospitalist on call if the hospitalist that took care of you is not available. Once you are discharged, your primary care physician will handle any further medical issues. Please note that NO REFILLS for any discharge medications will be authorized once you are discharged, as it is imperative that you return to your primary care physician (or establish a relationship with a primary care physician if you do not have one) for your aftercare needs so that they can reassess your need for medications and monitor your lab values.    Today   CHIEF COMPLAINT:   Chief Complaint  Patient presents with  . Shortness of Breath    HISTORY OF PRESENT ILLNESS:  84 y.o. male with a known history of multiple medical problems as below.  The patient was just discharged from the hospital yesterday due to right humerus fracture.  She was found shortness of breath and chest x-ray show possible pneumonia.  He is on home oxygen 4 L due to chronic respiratory failure. ED physician started antibiotics and heparin for possible PE.   VITAL SIGNS:  Blood pressure (!) 118/54, pulse Marland Kitchen(!)  109, temperature 98.4 F (36.9 C), temperature source Oral, resp. rate 20, height 5\' 9"  (1.753 m), weight 95.3 kg (210 lb), SpO2 93 %.  I/O:    Intake/Output Summary (Last 24 hours) at 06/22/2017 1030 Last data filed at 06/22/2017 0534 Gross per 24 hour  Intake 820 ml  Output 650 ml  Net 170 ml    PHYSICAL EXAMINATION:  GENERAL:  84 y.o.-year-old patient lying in the bed with no acute distress.  EYES: Pupils equal, round, reactive to light and accommodation. No scleral icterus. Extraocular muscles intact.  HEENT: Head atraumatic, normocephalic. Oropharynx and nasopharynx clear.  NECK:  Supple, no jugular venous distention. No thyroid enlargement, no tenderness.  LUNGS: Normal breath sounds bilaterally, no wheezing, rales,rhonchi or crepitation. No use of accessory  muscles of respiration.  CARDIOVASCULAR: S1, S2 normal. No murmurs, rubs, or gallops.  ABDOMEN: Soft, non-tender, non-distended. Bowel sounds present. No organomegaly or mass.  EXTREMITIES: No pedal edema, cyanosis, or clubbing.  NEUROLOGIC: Cranial nerves II through XII are intact. Muscle strength 5/5 in all extremities. Sensation intact. Gait not checked.  PSYCHIATRIC: The patient is alert and oriented x 3.  SKIN: No obvious rash, lesion, or ulcer.   DATA REVIEW:   CBC Recent Labs  Lab 06/20/17 0437  WBC 6.3  HGB 9.9*  HCT 30.3*  PLT 165    Chemistries  Recent Labs  Lab 06/21/17 0518  NA 142  K 3.8  CL 97*  CO2 37*  GLUCOSE 103*  BUN 44*  CREATININE 1.53*  CALCIUM 8.2*    Cardiac Enzymes Recent Labs  Lab 06/17/17 2102  TROPONINI 0.27*    Microbiology Results  Results for orders placed or performed during the hospital encounter of 06/17/17  Blood culture (routine x 2)     Status: None   Collection Time: 06/17/17  2:50 PM  Result Value Ref Range Status   Specimen Description BLOOD LAC  Final   Special Requests   Final    BOTTLES DRAWN AEROBIC AND ANAEROBIC Blood Culture adequate volume   Culture   Final    NO GROWTH 5 DAYS Performed at [facility], [address], [place], [place] [address]    Report Status 06/22/2017 FINAL  Final  Blood culture (routine x 2)     Status: None   Collection Time: 06/17/17  2:50 PM  Result Value Ref Range Status   Specimen Description BLOOD LFOA  Final   Special Requests   Final    BOTTLES DRAWN AEROBIC AND ANAEROBIC Blood Culture adequate volume   Culture   Final    NO GROWTH 5 DAYS Performed at [facility], [address] [place], [place] [address]    Report Status 06/22/2017 FINAL  Final  MRSA PCR Screening     Status: None   Collection Time: 06/17/17  6:32 PM  Result Value Ref Range Status   MRSA by PCR NEGATIVE NEGATIVE Final    Comment:        The GeneXpert MRSA Assay (FDA approved for  NASAL specimens only), is one component of a comprehensive MRSA colonization surveillance program. It is not intended to diagnose MRSA infection nor to guide or monitor treatment for MRSA infections. Performed at [facility], [address] [place], [place] [address]   Culture, sputum-assessment     Status: None   Collection Time: 06/20/17  6:13 PM  Result Value Ref Range Status   Specimen Description EXPECTORATED SPUTUM  Final   Special Requests NONE  Final   Sputum evaluation   Final    THIS SPECIMEN IS ACCEPTABLE FOR SPUTUM CULTURE Performed at [facility], [address] [place], [place] [address]    Report Status 06/21/2017 FINAL  Final  Culture, respiratory (NON-Expectorated)     Status: None (Preliminary result)   Collection Time: 06/20/17  6:13 PM  Result Value Ref Range Status   Specimen Description   Final    EXPECTORATED SPUTUM Performed at [facility], [address] [place], [place] [address]    Special Requests   Final    NONE Reflexed from [id number] Performed at [facility], [address] [place], [place] [address]    Gram Stain   Final    MODERATE WBC PRESENT, PREDOMINANTLY PMN FEW YEAST    Culture   Final    CULTURE REINCUBATED FOR BETTER GROWTH Performed at [facility], [address] [place], [place] [address]    Report Status PENDING  Incomplete    RADIOLOGY:  Dg Chest 2 View  Result Date: 06/21/2017 CLINICAL DATA:  84 year old male with a history of pneumonia EXAM: CHEST  2 VIEW COMPARISON:  06/17/2017, 06/15/2017 FINDINGS: Cardiomediastinal silhouette unchanged with cardiomegaly. Low lung volumes.  Apical lordotic positioning.  No pneumothorax. Lateral view demonstrates opacity in the costophrenic sulcus. Nodule of the left upper lobe is unchanged. Coarsened interstitial markings. No displaced fracture. IMPRESSION: Bilateral pleural effusions with associated atelectasis/consolidation. Low lung  volumes. Unchanged left upper lobe nodule. Electronically Signed   By: Gilmer Mor D.O.   On: 06/21/2017 14:55    EKG:   Orders placed or performed during the hospital encounter of 06/17/17  . EKG 12-Lead  . EKG 12-Lead  . EKG 12-Lead  . EKG 12-Lead  . EKG 12-Lead  . EKG 12-Lead      Management plans discussed with the patient, family and they are in agreement.  CODE STATUS:     Code Status Orders  (From admission, onward)        Start     Ordered   06/17/17 2021  Full code  Continuous     06/17/17 2020    Code Status History    Date Active Date Inactive Code Status Order ID Comments User Context   06/14/2017 03:04 06/17/2017 00:24 Full Code [id number]  Ihor Austin, MD Inpatient   04/01/2017 11:03 04/07/2017 16:38 Full Code [id number]  Delfino Lovett, MD ED   10/07/2016 15:35 10/08/2016 18:31 Full Code [id number]  Delfino Lovett, MD Inpatient   04/21/2015 15:16 04/24/2015 18:44 Full Code [id number]  Marguarite Arbour, MD Inpatient    Advance Directive Documentation     Most Recent Value  Type of Advance Directive  Healthcare Power of Attorney  Pre-existing out of facility DNR order (yellow form or pink MOST form)  No data  "MOST" Form in Place?  No data      TOTAL TIME TAKING CARE OF THIS PATIENT: 45 minutes.    Evelena Asa Salary M.D on 06/22/2017 at 10:30 AM  Between 7am to 6pm - Pager - [phone]  After 6pm go to [email or web address] - password [email or web address]  [facility] [place] [facility]  Office  [phone]  CC: Primary care physician; Maple Hudson., MD   Note: This dictation was prepared with Dragon dictation along with smaller phrase technology. Any transcriptional errors that result from this process are unintentional.

## 2017-06-22 NOTE — Progress Notes (Signed)
Report called to Bet at [facility].

## 2017-06-22 NOTE — Telephone Encounter (Signed)
SIGNED CMN PUT INTO [facility] BOX TO BE PICKED [email or web address]

## 2017-06-23 ENCOUNTER — Other Ambulatory Visit: Payer: Self-pay

## 2017-06-23 ENCOUNTER — Inpatient Hospital Stay: Payer: Medicare Other | Admitting: Family Medicine

## 2017-06-23 ENCOUNTER — Non-Acute Institutional Stay (SKILLED_NURSING_FACILITY): Payer: Medicare Other | Admitting: Gerontology

## 2017-06-23 DIAGNOSIS — J9600 Acute respiratory failure, unspecified whether with hypoxia or hypercapnia: Secondary | ICD-10-CM

## 2017-06-23 DIAGNOSIS — Z515 Encounter for palliative care: Secondary | ICD-10-CM | POA: Diagnosis not present

## 2017-06-23 LAB — CULTURE, RESPIRATORY: CULTURE: NORMAL

## 2017-06-23 LAB — CULTURE, RESPIRATORY W GRAM STAIN

## 2017-06-23 MED ORDER — MORPHINE SULFATE (CONCENTRATE) 20 MG/ML PO SOLN: 5.0000 mg | ORAL | 0 refills | Status: AC | PRN

## 2017-06-23 MED ORDER — LORAZEPAM 0.5 MG PO TABS: 0.5000 mg | ORAL_TABLET | ORAL | 1 refills | Status: AC | PRN

## 2017-07-09 NOTE — Progress Notes (Signed)
Location:      Place of Service:  SNF (31) Provider:  Toni Arthurs, NP-C  [name]., MD  Patient Care Team: [name]., MD as PCP - General [facility])  Extended Emergency Contact Information Primary Emergency Contact: Joswick,Patricia H Address: [address]          [place], [place] [address] Johnnette Litter of [place] Phone: [phone] Mobile Phone: [phone] Relation: Spouse Secondary Emergency Contact: Naval,Keith Address: [address]          [place], [place] [address] Johnnette Litter of [place] Phone: [phone] Mobile Phone: [phone] Relation: Son  Code Status:  Full Code> Transitioned to DNR Goals of care: Advanced Directive information Advanced Directives 06/17/2017  Does Patient Have a Medical Advance Directive? Yes  Type of Advance Directive [place]  Does patient want to make changes to medical advance directive? No - Patient declined  Copy of [place] in Chart? Yes  Would patient like information on creating a medical advance directive? No - Patient declined     Chief Complaint  Patient presents with  . Acute Visit    dyspnea  . Readmit To SNF    HPI:  Pt is a 84 y.o. male seen today for an acute visit for acute respiratory distress/ failure. Pt was readmitted to the facility for rehab yesterday after readmission to [facility] for respiratory failure and PNA. Pt was initially in the hospital for fall with head laceration and Right humerus fracture. Fracture was non-operable. Arm in a sling with ice for edema control. Pt went into respiratory failure last week and returned to [facility] with PNA. Pt treated with abt, etc and returned yesterday afternoon for further rehab. Today, pt is having increased confusion/ disorientation. O2 sats decreased on 5 liters [place]- 88%. He was "picking at the air." Increased dyspnea/ labored breathing/ open-mouth breathing. Restlessness. Pt was pale. Feet were  warm. Strong, equal pedal pulses. ++Bruising to the Right shoulder. Unable to keep sling in correct positioning d/t restlessness. Son present at the bedside. Discussed Palliative Care- family was agreeable to consult. Discussed   Code Status- son agreeable to DNR (verified with wife). Discussed symptoms seen- listlessness, air hunger and co-morbidities with trajectory and expected outcomes. Son verbalized he knew his dad "would not want to linger." Also discussed the use of medications for palliation and their purpose. Family verbalized understanding and desire for pt to be comfortable. Wife in agreement. She spoke of pt's faith and spirituality. She said pt told her a few days ago "that he was ready to go." Pt minimally verbal, but was able to deny chest pain or difficulty breathing/ discomfort after he was adjusted in bed. Assured wife she could call with any concerns. Updated nursing.      Past Medical History:  Diagnosis Date  . CHF (congestive heart failure) ([place])   . CKD (chronic kidney disease)   . COPD (chronic obstructive pulmonary disease) ([place])   . Gout   . Hypertension   . Osteoarthritis   . Renal disorder    Past Surgical History:  Procedure Laterality Date  . BACK SURGERY    . CATARACT EXTRACTION Bilateral   . CHOLECYSTECTOMY    . FLEXIBLE BRONCHOSCOPY N/A 02/09/2017   Procedure: FLEXIBLE BRONCHOSCOPY;  Surgeon: Allyne Gee, MD;  Location: [facility] ORS;  Service: Pulmonary;  Laterality: N/A;  . HERNIA REPAIR    . INGUINAL HERNIA REPAIR    . LAMINECTOMY    .  POLYPECTOMY     colon  . UPPER GI ENDOSCOPY  01/15/04    No Known Allergies  Allergies as of 06-25-17   No Known Allergies     Medication List        Accurate as of 2017/06/25 11:59 PM. Always use your most recent med list.          acetaminophen 325 MG tablet Commonly known as:  TYLENOL Take 650 mg by mouth every 6 (six) hours as needed. Pain or increased fever.May be administered orally, per G-tube if needed  or rectally if unable to swallow (separate order). Maximum dose for 24 hours is 3,000 mg from all sources of Acetaminophen/ Tylenol   allopurinol 100 MG tablet Commonly known as:  ZYLOPRIM Take 1 tablet (100 mg total) by mouth daily.   bisacodyl 5 MG EC tablet Commonly known as:  DULCOLAX Take 1 tablet (5 mg total) by mouth daily as needed for moderate constipation.   Cholecalciferol 4000 units Caps Take 1 capsule by mouth daily.   colchicine 0.6 MG tablet Take 1 tablet (0.6 mg total) by mouth daily.   feeding supplement (ENSURE ENLIVE) Liqd Take 237 mLs by mouth 2 (two) times daily between meals.   furosemide 40 MG tablet Commonly known as:  LASIX Take 1 tablet (40 mg total) by mouth every other day. Monday, Wednesday, Friday   guaiFENesin 600 MG 12 hr tablet Commonly known as:  MUCINEX Take 1 tablet (600 mg total) by mouth 2 (two) times daily as needed for cough.   HYDROcodone-acetaminophen 5-325 MG tablet Commonly known as:  NORCO/VICODIN Take 1-2 tablets by mouth every 4 (four) hours as needed for moderate pain.   ipratropium-albuterol 0.5-2.5 (3) MG/3ML Soln Commonly known as:  DUONEB Take 3 mLs by nebulization every 6 (six) hours.   LORazepam 0.5 MG tablet Commonly known as:  ATIVAN Take 1 tablet (0.5 mg total) by mouth every 4 (four) hours as needed.   megestrol 40 MG/ML suspension Commonly known as:  MEGACE Take 10 mLs (400 mg total) by mouth 2 (two) times daily.   metoprolol succinate 25 MG 24 hr tablet Commonly known as:  TOPROL XL Take 3 tablets (75 mg total) by mouth daily.   midodrine 10 MG tablet Commonly known as:  PROAMATINE Take 1 tablet (10 mg total) by mouth 3 (three) times daily with meals.   morphine 20 MG/ML concentrated solution Commonly known as:  ROXANOL Take 0.25-0.5 mLs (5-10 mg total) by mouth every hour as needed. 0.25-0.5 ml   moxifloxacin 400 MG tablet Commonly known as:  AVELOX Take 1 tablet (400 mg total) by mouth daily for 7  days.   nitroGLYCERIN 0.4 MG SL tablet Commonly known as:  NITROSTAT Place 1 tablet (0.4 mg total) under the tongue every 5 (five) minutes as needed for chest pain.   polyvinyl alcohol 1.4 % ophthalmic solution Commonly known as:  LIQUIFILM TEARS Place 1 drop into the left eye as needed for dry eyes.   rifampin 300 MG capsule Commonly known as:  RIFADIN Take 600 mg by mouth 3 (three) times a week. On Monday Wednesday and Friday       Review of Systems  Unable to perform ROS: Mental status change  Constitutional: Positive for activity change, appetite change, diaphoresis and fatigue. Negative for fever.  HENT: Positive for congestion. Negative for mouth sores, nosebleeds, postnasal drip, sneezing, sore throat, trouble swallowing and voice change.   Respiratory: Positive for shortness of breath. Negative for apnea,  cough, choking, chest tightness and wheezing.   Cardiovascular: Negative for chest pain, palpitations and leg swelling.  Gastrointestinal: Negative for abdominal distention, abdominal pain, constipation, diarrhea and nausea.  Genitourinary: Negative for difficulty urinating, dysuria, frequency and urgency.  Musculoskeletal: Positive for arthralgias (typical arthritis). Negative for back pain, gait problem and myalgias.  Skin: Negative for color change, pallor, rash and wound.  Neurological: Positive for speech difficulty and weakness. Negative for dizziness, tremors, syncope, numbness and headaches.  Psychiatric/Behavioral: Positive for agitation. Negative for behavioral problems.  All other systems reviewed and are negative.   Immunization History  Administered Date(s) Administered  . Influenza, High Dose Seasonal PF 04/13/2017  . Pneumococcal Conjugate-13 09/15/2013  . Pneumococcal Polysaccharide-23 04/13/2017  . Td 06/04/2004   Pertinent  Health Maintenance Due  Topic Date Due  . INFLUENZA VACCINE  Completed  . PNA vac Low Risk Adult  Completed   Fall Risk   06/07/2017 10/20/2016 12/31/2014  Falls in the past year? No No No   Functional Status Survey:    Vitals:   2017-06-29 0800  BP: 126/63  Pulse: (!) 101  Resp: 18  Temp: 98.6 F (37 C)  SpO2: 96%  Weight: 214 lb (97.1 kg)   Body mass index is 31.6 kg/m. Physical Exam  Constitutional: He appears well-developed and well-nourished. He appears listless. He is active and cooperative. He has a sickly appearance. He does not appear ill. He appears distressed. Nasal cannula in place.  HENT:  Head: Normocephalic and atraumatic.  Mouth/Throat: Uvula is midline, oropharynx is clear and moist and mucous membranes are normal. Mucous membranes are not pale, not dry and not cyanotic.  Eyes: Conjunctivae, EOM and lids are normal. Pupils are equal, round, and reactive to light.  Neck: Trachea normal, normal range of motion and full passive range of motion without pain. Neck supple. No JVD present. No tracheal deviation, no edema and no erythema present. No thyromegaly present.  Cardiovascular: Normal rate, normal heart sounds, intact distal pulses and normal pulses. An irregular rhythm present. Exam reveals no gallop, no distant heart sounds and no friction rub.  No murmur heard. Pulses:      Dorsalis pedis pulses are 2+ on the right side, and 2+ on the left side.  No edema  Pulmonary/Chest: Accessory muscle usage present. No respiratory distress. He has decreased breath sounds in the right upper field, the right middle field, the left upper field and the left middle field. He has no wheezes. He has no rhonchi. He has rales in the right lower field and the left lower field. He exhibits no tenderness.  Abdominal: Soft. Normal appearance. He exhibits no distension and no ascites. Bowel sounds are absent. There is no tenderness.  Musculoskeletal: Normal range of motion. He exhibits no tenderness.       Right upper arm: He exhibits bony tenderness (humerus fracture), swelling and edema.  Expected  osteoarthritis, stiffness; Bilateral Calves soft, supple. Negative Homan's Sign. B- pedal pulses equal  Neurological: He has normal strength. He appears listless. He is disoriented. Coordination and gait abnormal.  Skin: Skin is warm and intact. He is diaphoretic. No cyanosis. There is pallor. Nails show no clubbing.  Psychiatric: He has a normal mood and affect. His speech is normal. Judgment and thought content normal. He is agitated (restlessness). Cognition and memory are normal.  Nursing note and vitals reviewed.   Labs reviewed: Recent Labs    04/05/17 0504  06/17/17 1449 06/18/17 0236 06/20/17 0437 06/21/17 0518  NA  143   < > 140 140  --  142  K 4.2   < > 5.4* 4.2  --  3.8  CL 105   < > 92* 93*  --  97*  CO2 35*   < > 39* 40*  --  37*  GLUCOSE 93   < > 138* 105*  --  103*  BUN 24*   < > 59* 62*  --  44*  CREATININE 1.31*   < > 2.21* 2.04* 1.71* 1.53*  CALCIUM 8.2*   < > 8.5* 8.2*  --  8.2*  MG 2.5*  --   --   --   --   --   PHOS 4.5  --   --   --   --   --    < > = values in this interval not displayed.   Recent Labs    04/06/17 0550 04/07/17 0404 04/13/17 0921 06/07/17 1508  AST 54* 34 19 23  ALT 37 _0 ALKPHOS 68 71  --  87  BILITOT 1.1 0.6 0.5 0.2  PROT 5.8* 5.6* 5.7* 6.0  ALBUMIN 2.9* 2.6*  --  3.6   Recent Labs    06/07/17 1508  06/17/17 1449 06/18/17 0236 06/19/17 0703 06/20/17 0437  WBC 5.3   < > 8.5 9.7 6.3 6.3  NEUTROABS 3.1  --  7.0*  --   --  4.1  HGB 11.4*   < > 9.0* 8.6* 8.5* 9.9*  HCT 37.3*   < > 27.8* 26.3* 26.4* 30.3*  MCV 99*   < > 95.1 95.1 97.0 96.4  PLT 147*   < > 138* 134* 166 165   < > = values in this interval not displayed.   Lab Results  Component Value Date   TSH 1.804 04/01/2017   Lab Results  Component Value Date   HGBA1C 5.1 04/21/2015   Lab Results  Component Value Date   CHOL 144 09/18/2013   HDL 50 09/18/2013   LDLCALC 81 09/18/2013   TRIG 64 09/18/2013    Significant Diagnostic Results in last 30  days:  Dg Chest 1 View  Result Date: 06/15/2017 CLINICAL DATA:  Wheezing, fall, humeral fracture EXAM: CHEST 1 VIEW COMPARISON:  Portable exam 1733 hours compared to 04/02/2017 FINDINGS: Enlargement of cardiac silhouette with pulmonary vascular congestion. Low lung volumes with bibasilar atelectasis greater on LEFT. Chronic nodular density projects over the upper to mid LEFT lung, corresponding to an area of nodular thickening/scarring at the major fissure on a prior CT. No definite infiltrate, pleural effusion or pneumothorax. Diffuse osseous demineralization. IMPRESSION: Enlargement of cardiac silhouette with pulmonary vascular congestion. Bibasilar atelectasis greater on LEFT. Stable nodular density at upper to mid LEFT lung. Electronically Signed   By: Lavonia Dana M.D.   On: 06/15/2017 17:47   Dg Chest 2 View  Result Date: 06/21/2017 CLINICAL DATA:  84 year old male with a history of pneumonia EXAM: CHEST  2 VIEW COMPARISON:  06/17/2017, 06/15/2017 FINDINGS: Cardiomediastinal silhouette unchanged with cardiomegaly. Low lung volumes.  Apical lordotic positioning.  No pneumothorax. Lateral view demonstrates opacity in the costophrenic sulcus. Nodule of the left upper lobe is unchanged. Coarsened interstitial markings. No displaced fracture. IMPRESSION: Bilateral pleural effusions with associated atelectasis/consolidation. Low lung volumes. Unchanged left upper lobe nodule. Electronically Signed   By: Corrie Mckusick D.O.   On: 06/21/2017 14:55   Dg Shoulder Right  Result Date: 06/14/2017 CLINICAL DATA:  Status post fall, striking dresser. Right shoulder  pain, acute onset. Initial encounter. EXAM: RIGHT SHOULDER - 2+ VIEW COMPARISON:  Chest radiograph performed 04/02/2017 FINDINGS: There is a mildly comminuted fracture of the proximal right humeral diaphysis, with minimal displacement. Overlying soft tissue swelling is noted. The right humeral head remains seated at the glenoid fossa. Mild degenerative  change is noted at the right acromioclavicular joint. The visualized portions of the right lung are clear. IMPRESSION: Mildly comminuted fracture of the proximal right humeral diaphysis, with minimal displacement. Electronically Signed   By: Garald Balding M.D.   On: 06/14/2017 00:32   Ct Head Wo Contrast  Result Date: 06/14/2017 CLINICAL DATA:  [place] in dark bedroom, struck dresser. No loss of consciousness. RIGHT scalp laceration. EXAM: CT HEAD WITHOUT CONTRAST CT CERVICAL SPINE WITHOUT CONTRAST TECHNIQUE: Multidetector CT imaging of the head and cervical spine was performed following the standard protocol without intravenous contrast. Multiplanar CT image reconstructions of the cervical spine were also generated. COMPARISON:  CT HEAD April 01, 2017 FINDINGS: CT HEAD FINDINGS BRAIN: No intraparenchymal hemorrhage, mass effect nor midline shift. The ventricles and sulci are normal for age. Patchy supratentorial white matter hypodensities less than expected for patient's age, though non-specific are most compatible with chronic small vessel ischemic disease. LEFT inferior basal ganglia perivascular space. No acute large vascular territory infarcts. No abnormal extra-axial fluid collections. Basal cisterns are patent. VASCULAR: Mild calcific atherosclerosis of the carotid siphons. SKULL: No skull fracture. Minimal RIGHT frontal scalp swelling without subcutaneous gas or radiopaque bodies. The SINUSES/ORBITS: The mastoid air-cells and included paranasal sinuses are well-aerated.The included ocular globes and orbital contents are non-suspicious. Status post bilateral ocular lens implants. OTHER: None. CT CERVICAL SPINE FINDINGS-motion degraded examination. ALIGNMENT: Straightened lordosis.  Vertebral bodies in alignment. SKULL BASE AND VERTEBRAE: Cervical vertebral bodies and posterior elements are intact. Moderate C6-7 disc height loss and endplate spurring compatible with degenerative disc. Severe RIGHT upper  cervical facet arthropathy. No destructive bony lesions. C1-2 articulation maintained. SOFT TISSUES AND SPINAL CANAL: Nonacute. Mild calcific atherosclerosis carotid bifurcations. DISC LEVELS: No significant osseous canal stenosis. Moderate to severe LEFT C3-4, RIGHT C4-5 neural foraminal narrowing. UPPER CHEST: Lung apices are clear. OTHER: None. IMPRESSION: CT HEAD: 1. Negative noncontrast CT HEAD for age. CT CERVICAL SPINE: 1. No acute fracture or malalignment on this motion degraded examination. 2. Moderate to severe C3-4 and C4-5 neural foraminal narrowing. Electronically Signed   By: Elon Alas M.D.   On: 06/14/2017 00:21   Ct Cervical Spine Wo Contrast  Result Date: 06/14/2017 CLINICAL DATA:  [place] in dark bedroom, struck dresser. No loss of consciousness. RIGHT scalp laceration. EXAM: CT HEAD WITHOUT CONTRAST CT CERVICAL SPINE WITHOUT CONTRAST TECHNIQUE: Multidetector CT imaging of the head and cervical spine was performed following the standard protocol without intravenous contrast. Multiplanar CT image reconstructions of the cervical spine were also generated. COMPARISON:  CT HEAD April 01, 2017 FINDINGS: CT HEAD FINDINGS BRAIN: No intraparenchymal hemorrhage, mass effect nor midline shift. The ventricles and sulci are normal for age. Patchy supratentorial white matter hypodensities less than expected for patient's age, though non-specific are most compatible with chronic small vessel ischemic disease. LEFT inferior basal ganglia perivascular space. No acute large vascular territory infarcts. No abnormal extra-axial fluid collections. Basal cisterns are patent. VASCULAR: Mild calcific atherosclerosis of the carotid siphons. SKULL: No skull fracture. Minimal RIGHT frontal scalp swelling without subcutaneous gas or radiopaque bodies. The SINUSES/ORBITS: The mastoid air-cells and included paranasal sinuses are well-aerated.The included ocular globes and orbital contents are non-suspicious. Status  post bilateral ocular lens implants. OTHER: None. CT CERVICAL SPINE FINDINGS-motion degraded examination. ALIGNMENT: Straightened lordosis.  Vertebral bodies in alignment. SKULL BASE AND VERTEBRAE: Cervical vertebral bodies and posterior elements are intact. Moderate C6-7 disc height loss and endplate spurring compatible with degenerative disc. Severe RIGHT upper cervical facet arthropathy. No destructive bony lesions. C1-2 articulation maintained. SOFT TISSUES AND SPINAL CANAL: Nonacute. Mild calcific atherosclerosis carotid bifurcations. DISC LEVELS: No significant osseous canal stenosis. Moderate to severe LEFT C3-4, RIGHT C4-5 neural foraminal narrowing. UPPER CHEST: Lung apices are clear. OTHER: None. IMPRESSION: CT HEAD: 1. Negative noncontrast CT HEAD for age. CT CERVICAL SPINE: 1. No acute fracture or malalignment on this motion degraded examination. 2. Moderate to severe C3-4 and C4-5 neural foraminal narrowing. Electronically Signed   By: Elon Alas M.D.   On: 06/14/2017 00:21   Nm Pulmonary Perf And Vent  Result Date: 06/18/2017 CLINICAL DATA:  Elevated D-dimer. The patient suffered a fall 06/13/2017 with a right humerus fracture. EXAM: NUCLEAR MEDICINE VENTILATION - PERFUSION LUNG SCAN TECHNIQUE: Ventilation images were obtained in multiple projections using inhaled aerosol Tc-60mDTPA. Perfusion images were obtained in multiple projections after intravenous injection of Tc-957mAA. RADIOPHARMACEUTICALS:  32.61 mCi of Tc-9943mPA aerosol inhalation and 4.34 mCi [id number]-IV COMPARISON:  Single-view of the chest 06/17/2017 and 04/02/2017. CT chest 04/01/2017. FINDINGS: Ventilation: Radiotracer distribution is patchy likely due to technical factors. Decreased lung volume on the left is consistent with elevated left hemidiaphragm seen on prior plain film. Perfusion: No wedge shaped peripheral perfusion defects to suggest acute pulmonary embolism. IMPRESSION: Negative for pulmonary embolus.  Electronically Signed   By: ThomInge Rise.   On: 06/18/2017 13:10   Dg Chest Port 1 View  Result Date: 06/17/2017 CLINICAL DATA:  Hypoxia. EXAM: PORTABLE CHEST 1 VIEW COMPARISON:  06/15/2017 FINDINGS: Enlarged cardiac silhouette. Calcific atherosclerotic disease of the aorta. Left lower lobe atelectasis versus airspace consolidation with increasing volume loss. 10 mm left upper lobe pulmonary nodule versus focal airspace consolidation. Osseous structures are without acute abnormality. Soft tissues are grossly normal. IMPRESSION: Left lower lobe atelectasis versus airspace consolidation with increasing volume loss. Possible small left pleural effusion. 10 mm left upper lobe pulmonary nodule versus focal airspace consolidation. Enlarged cardiac silhouette. Electronically Signed   By: DobrFidela Salisbury.   On: 06/17/2017 15:48   Dg Hip Unilat W Or Wo Pelvis 2-3 Views Right  Result Date: 06/14/2017 CLINICAL DATA:  Pain after a fall EXAM: DG HIP (WITH OR WITHOUT PELVIS) 2-3V RIGHT COMPARISON:  None. FINDINGS: No fracture or dislocation is seen.  Joint space is maintained. IMPRESSION: No acute osseous abnormality Electronically Signed   By: Kim Donavan Foil.   On: 06/14/2017 02:46    Assessment/Plan GradDyllen seen today for acute visit and readmit to snf.  Diagnoses and all orders for this visit:  Acute respiratory failure, unspecified whether with hypoxia or hypercapnia (HCC)[place]    DNR  Palliative Care Referral  Morphine Concentrate 20 mg/ mL- 0.25-0.5 mL po Q 1 hour prn pain, dyspnea, restlessness  Lorazepam 0.5 mg po Q 4 hours prn- anxiety, agitation, restlessness  Scopolamine patch 1.5 mg- 1 patch behind the ear Q 3 days for secretions  O2 4-6 liters/ min- titrate for comfort  Maintain pt safety  Spiritual, emotional, educational support for family prn  Family/ staff Communication:   Total Time: 40 minutes  Documentation: 15 minutes  Face to  Face: 25 minutes  Family/Phone:  Labs/tests ordered:    Medication list reviewed and assessed for continued appropriateness.  Vikki Ports, NP-C Geriatrics [facility] [phone], [place] [address] Cell Phone (Mon-Fri 8am-5pm):  [phone] On Call:  [phone] & follow prompts after 5pm & weekends Office Phone:  [phone] Office Fax:  [phone]

## 2017-07-09 NOTE — Telephone Encounter (Signed)
Rx sent to [facility] phone : [phone] , fax : [phone]  

## 2017-07-09 DEATH — deceased

## 2017-08-12 NOTE — Telephone Encounter (Signed)
complete

## 2017-09-06 ENCOUNTER — Ambulatory Visit: Payer: Self-pay | Admitting: Internal Medicine

## 2017-10-25 IMAGING — CT CT ANGIO CHEST
1 of 2 series · 17 of 30 positions shown · IV contrast (APPLIED)
Comparison: 05/15/2013, 04/13/2011.

CLINICAL DATA: 84-year-old with acute onset of shortness of breath
and tachycardia while at home, hypoxic upon arrival in the emergency
department. Current history of asthma, CHF and chronic kidney
disease.

EXAM:
CT ANGIOGRAPHY CHEST WITH CONTRAST
TECHNIQUE: Multidetector CT imaging of the chest was performed using the
standard protocol during bolus administration of intravenous
contrast. Multiplanar CT image reconstructions and MIPs were
obtained to evaluate the vascular anatomy.
CONTRAST:  80mL OMNIPAQUE IOHEXOL 350 MG/ML IV.

[Series 5: pe 1.0 thins · axial · 0.77mm/px · z∈[-762,-523]mm · 17 of 269 slices shown]
[im 15/269  lung]
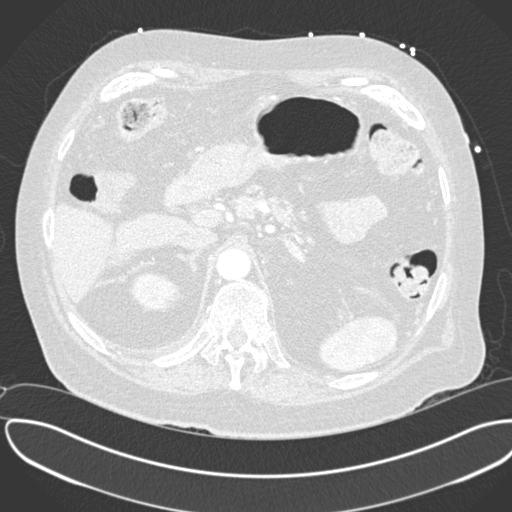
[im 30/269  mediastinal]
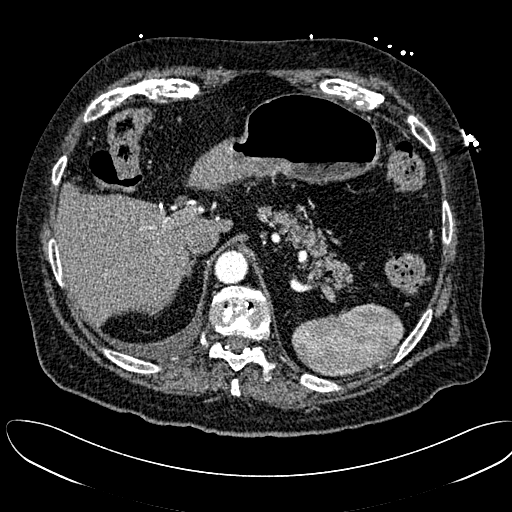
[im 45/269  lung]
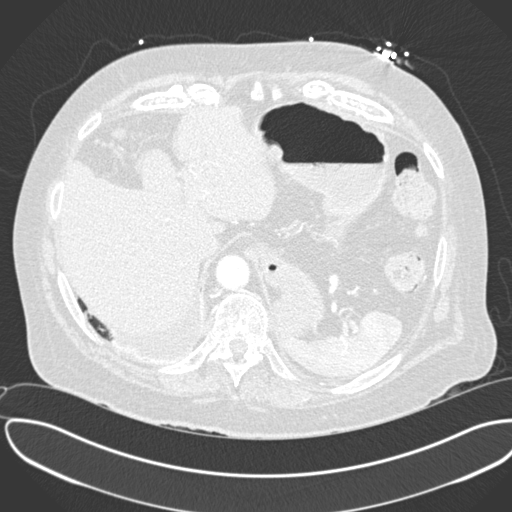
[im 60/269  mediastinal]
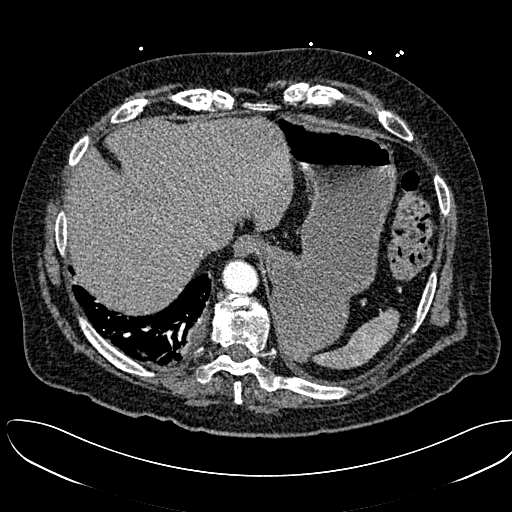
[im 75/269  lung]
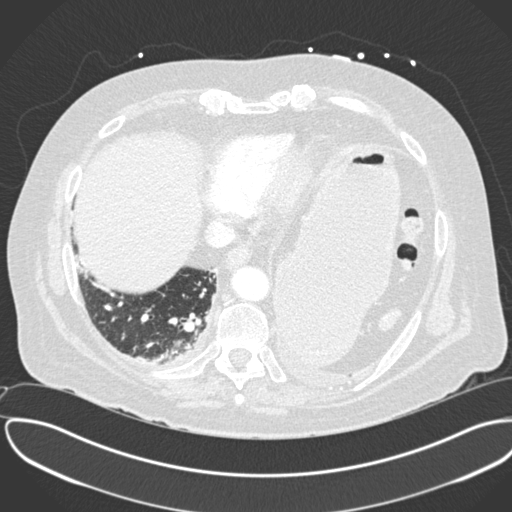
[im 90/269  mediastinal]
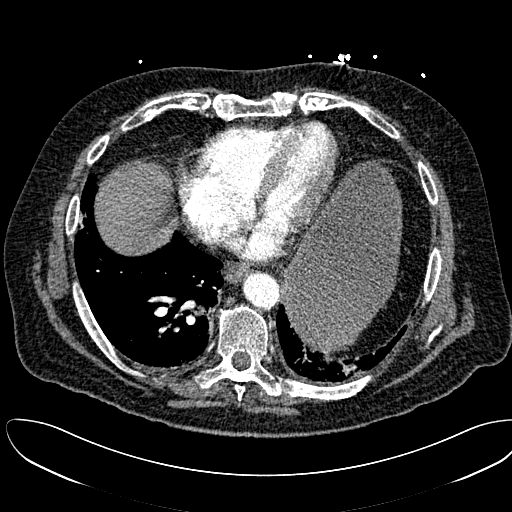
[im 105/269  lung]
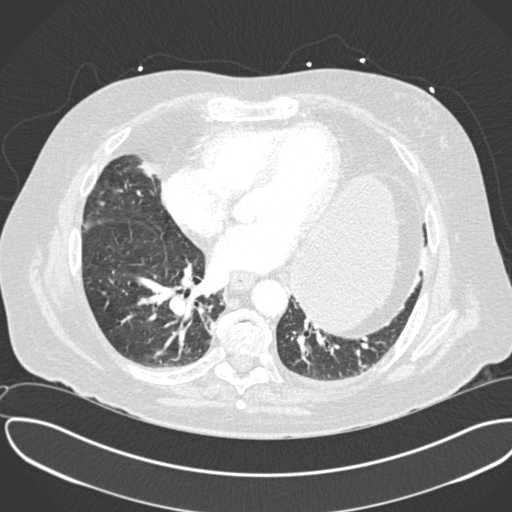
[im 120/269  mediastinal]
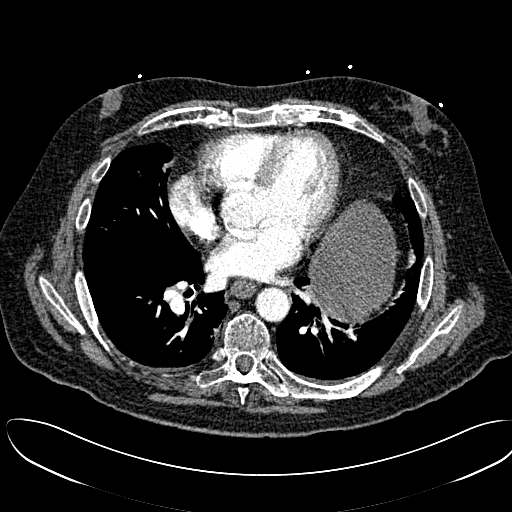
[im 135/269  lung]
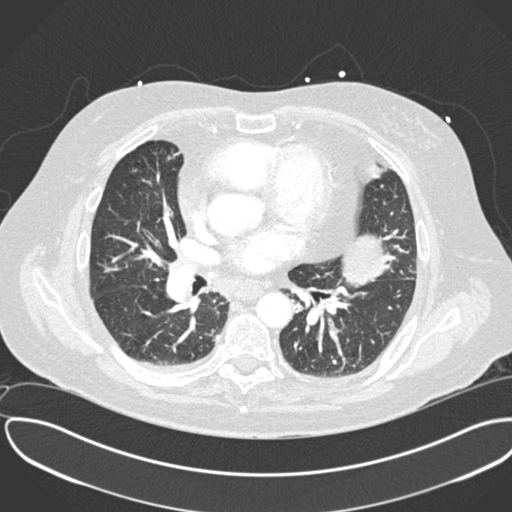
[im 149/269  mediastinal]
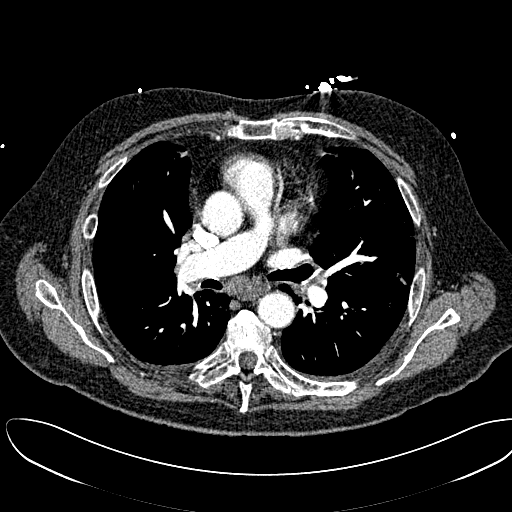
[im 164/269  lung]
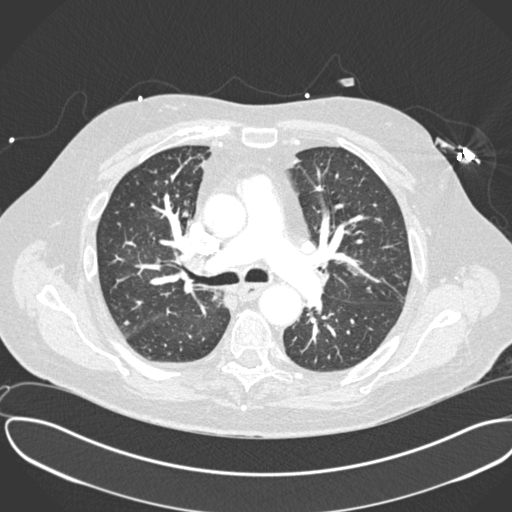
[im 179/269  mediastinal]
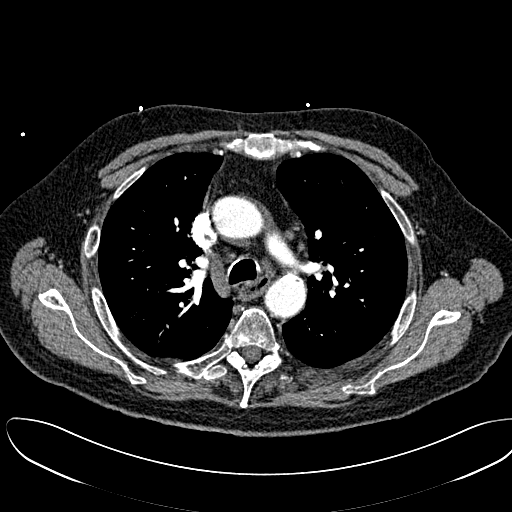
[im 194/269  lung]
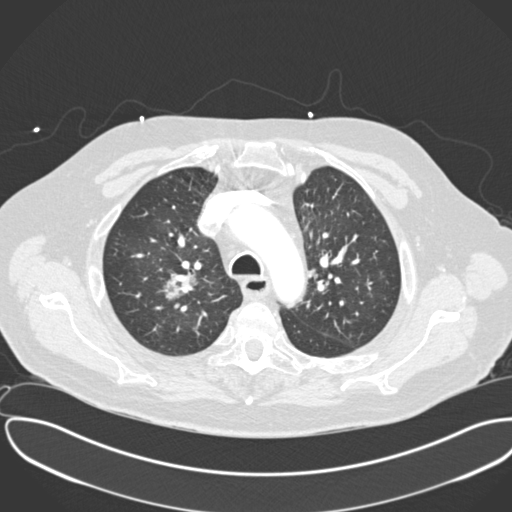
[im 209/269  mediastinal]
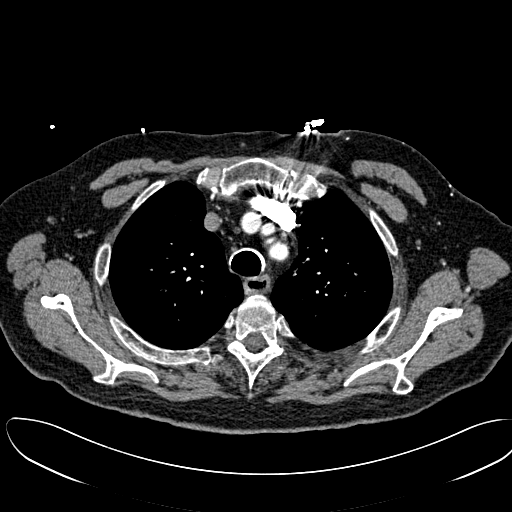
[im 224/269  lung]
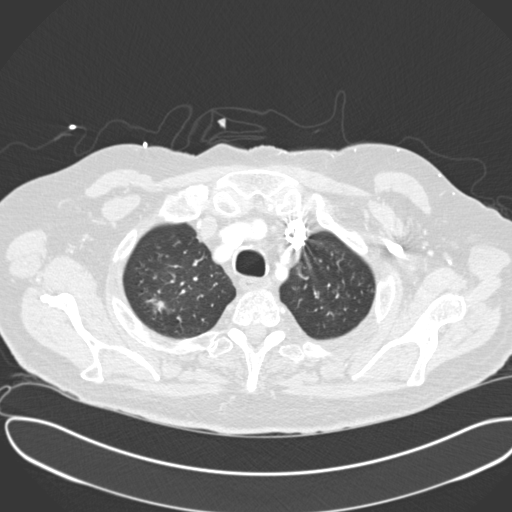
[im 239/269  mediastinal]
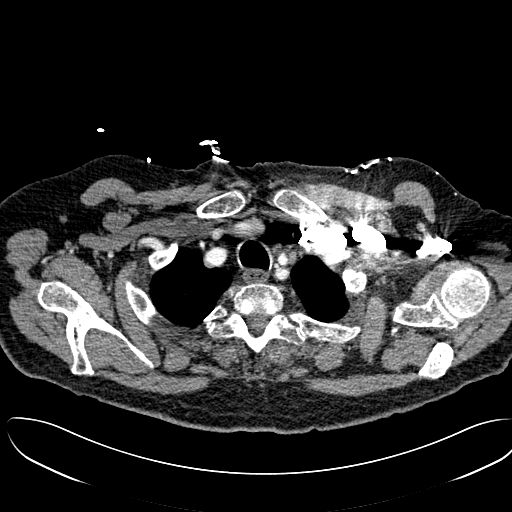
[im 254/269  lung]
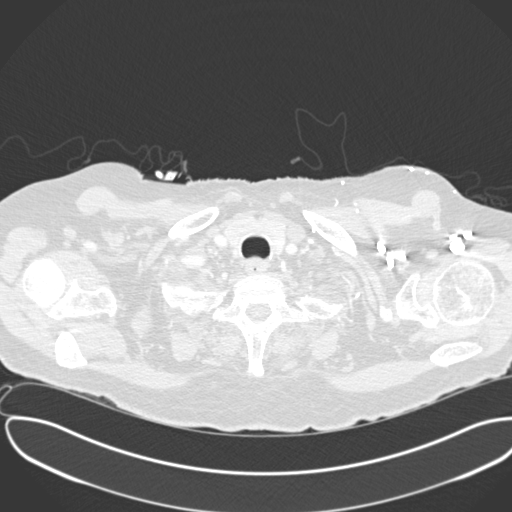

[17 of 30 positions shown; findings below may reference images not displayed]

FINDINGS: Technical quality: Good. Respiratory motion blurred several of the
images at the lung bases.

Pulmonary embolism:  Absent.

Cardiovascular: Heart mildly enlarged with left ventricular
predominance. Mild LAD and circumflex coronary artery
atherosclerosis. No pericardial effusion. Mild atherosclerosis
involving the thoracic aorta without evidence of aneurysm or
dissection. Proximal great vessels patent though atherosclerotic.
Direct origin of left vertebral artery from the aortic arch.

Mediastinum/Lymph Nodes: Numerous normal-sized mediastinal, hilar
and axillary lymph nodes. Some of the normal hilar and mediastinal
nodes contain calcification. No pathologic lymphadenopathy. Small of
fluid in the mid esophagus. No morphologic abnormality involving the
esophagus. No hiatal hernia.

Lungs/Pleura: Subsolid nodule in the right upper lobe measuring
maximally 2.9 cm (coronal image 94). Subsolid nodule more inferiorly
in the central right upper lobe (series 6, image 38) measuring
maximally 3.3 cm. Ground-glass opacity peripherally in the left
upper lobe, adjacent to the fissure, measuring maximally 3.5 cm.
Numerous areas of peribronchovascular nodularity in the right upper
lobe, right middle lobe, and to a lesser degree the left upper lobe
and both lower lobes. Densely calcified granuloma deep in the right
lower lobe. Small bilateral pleural effusions, right greater than
left, with minimal passive atelectasis in the lower lobes. Chronic
elevation left hemidiaphragm with chronic scar/atelectasis in the
lingula and left lower lobe. Bronchiectasis involving the right
lower lobe.

Other: Mild to moderate bilateral gynecomastia, left greater than
right.

Upper abdomen: No acute abnormalities involving the visualized upper
abdomen.

Musculoskeletal: Degenerative disc disease and spondylosis diffusely
throughout the thoracic spine, mild in degree.

Review of the MIP images confirms the above findings.
IMPRESSION: 1. No evidence of pulmonary embolism.
2. Multiple lung findings as detailed above including subsolid
nodules in the right upper lobe, ground-glass opacity peripherally
in the left upper lobe, and numerous areas of peribronchovascular
nodularity in both lungs. While all of the findings may be related
to chronic mycobacterium avium intracellulare infection, the
subsolid right upper lobe nodules have an appearance that can be
seen with adenocarcinoma. See below for followup recommendation.
3. Small bilateral pleural effusions, right greater than left, with
minimal passive atelectasis in the lower lobes.
4. Mild to moderate bilateral gynecomastia.
Initial follow-up by chest CT without contrast is recommended in 3
months to confirm persistence of the subsolid nodules. This
recommendation follows the consensus statement: Recommendations for
the Management of Subsolid Pulmonary Nodules Detected at CT: A
Statement from the [HOSPITAL] as published in Radiology
